# Patient Record
Sex: Male | Born: 1950 | Race: White | Hispanic: No | Marital: Married | Smoking: Never smoker
Health system: Southern US, Community
[De-identification: ages and names within clinical notes are randomized; demographics above are authoritative.]

## PROBLEM LIST (undated history)

## (undated) DIAGNOSIS — M48062 Spinal stenosis, lumbar region with neurogenic claudication: Secondary | ICD-10-CM

## (undated) DIAGNOSIS — M1990 Unspecified osteoarthritis, unspecified site: Secondary | ICD-10-CM

## (undated) DIAGNOSIS — M5416 Radiculopathy, lumbar region: Secondary | ICD-10-CM

## (undated) DIAGNOSIS — R030 Elevated blood-pressure reading, without diagnosis of hypertension: Secondary | ICD-10-CM

## (undated) DIAGNOSIS — R739 Hyperglycemia, unspecified: Secondary | ICD-10-CM

## (undated) DIAGNOSIS — G5602 Carpal tunnel syndrome, left upper limb: Secondary | ICD-10-CM

## (undated) DIAGNOSIS — G603 Idiopathic progressive neuropathy: Secondary | ICD-10-CM

## (undated) DIAGNOSIS — E538 Deficiency of other specified B group vitamins: Secondary | ICD-10-CM

## (undated) DIAGNOSIS — I8393 Asymptomatic varicose veins of bilateral lower extremities: Secondary | ICD-10-CM

## (undated) DIAGNOSIS — K759 Inflammatory liver disease, unspecified: Secondary | ICD-10-CM

## (undated) DIAGNOSIS — T8859XA Other complications of anesthesia, initial encounter: Secondary | ICD-10-CM

## (undated) DIAGNOSIS — E785 Hyperlipidemia, unspecified: Secondary | ICD-10-CM

## (undated) DIAGNOSIS — T7840XA Allergy, unspecified, initial encounter: Secondary | ICD-10-CM

## (undated) DIAGNOSIS — R55 Syncope and collapse: Secondary | ICD-10-CM

## (undated) DIAGNOSIS — N400 Enlarged prostate without lower urinary tract symptoms: Secondary | ICD-10-CM

## (undated) DIAGNOSIS — M4802 Spinal stenosis, cervical region: Secondary | ICD-10-CM

## (undated) DIAGNOSIS — Z87442 Personal history of urinary calculi: Secondary | ICD-10-CM

## (undated) DIAGNOSIS — G834 Cauda equina syndrome: Secondary | ICD-10-CM

## (undated) DIAGNOSIS — I10 Essential (primary) hypertension: Secondary | ICD-10-CM

## (undated) DIAGNOSIS — I872 Venous insufficiency (chronic) (peripheral): Secondary | ICD-10-CM

## (undated) DIAGNOSIS — R9431 Abnormal electrocardiogram [ECG] [EKG]: Secondary | ICD-10-CM

## (undated) DIAGNOSIS — N200 Calculus of kidney: Secondary | ICD-10-CM

## (undated) HISTORY — DX: Inflammatory liver disease, unspecified: K75.9

## (undated) HISTORY — DX: Unspecified osteoarthritis, unspecified site: M19.90

## (undated) HISTORY — PX: OTHER SURGICAL HISTORY: SHX169

## (undated) HISTORY — PX: TONSILLECTOMY: SUR1361

## (undated) HISTORY — PX: HEMORRHOID SURGERY: SHX153

## (undated) HISTORY — PX: LAMINOPLASTY: SHX997

## (undated) HISTORY — DX: Allergy, unspecified, initial encounter: T78.40XA

## (undated) HISTORY — DX: Essential (primary) hypertension: I10

## (undated) HISTORY — PX: COLONOSCOPY: SHX174

## (undated) HISTORY — PX: HERNIA REPAIR: SHX51

## (undated) HISTORY — DX: Hyperlipidemia, unspecified: E78.5

## (undated) HISTORY — DX: Calculus of kidney: N20.0

---

## 2013-05-01 HISTORY — PX: CARPAL TUNNEL RELEASE: SHX101

## 2015-03-02 DIAGNOSIS — Z800 Family history of malignant neoplasm of digestive organs: Secondary | ICD-10-CM | POA: Insufficient documentation

## 2015-03-02 DIAGNOSIS — E7800 Pure hypercholesterolemia, unspecified: Secondary | ICD-10-CM | POA: Insufficient documentation

## 2015-03-02 DIAGNOSIS — N200 Calculus of kidney: Secondary | ICD-10-CM | POA: Insufficient documentation

## 2015-03-02 LAB — HM HEPATITIS C SCREENING LAB: HM Hepatitis Screen: NEGATIVE

## 2015-04-14 LAB — HM COLONOSCOPY

## 2019-05-02 HISTORY — PX: CARPAL TUNNEL RELEASE: SHX101

## 2020-04-28 ENCOUNTER — Ambulatory Visit (INDEPENDENT_AMBULATORY_CARE_PROVIDER_SITE_OTHER): Payer: Medicare Other | Admitting: Internal Medicine

## 2020-04-28 ENCOUNTER — Other Ambulatory Visit: Payer: Self-pay

## 2020-04-28 ENCOUNTER — Encounter: Payer: Self-pay | Admitting: Internal Medicine

## 2020-04-28 ENCOUNTER — Encounter (INDEPENDENT_AMBULATORY_CARE_PROVIDER_SITE_OTHER): Payer: Self-pay

## 2020-04-28 VITALS — BP 116/76 | HR 70 | Temp 98.0°F | Resp 16 | Ht 69.5 in | Wt 170.0 lb

## 2020-04-28 DIAGNOSIS — R0609 Other forms of dyspnea: Secondary | ICD-10-CM | POA: Insufficient documentation

## 2020-04-28 DIAGNOSIS — G5602 Carpal tunnel syndrome, left upper limb: Secondary | ICD-10-CM

## 2020-04-28 DIAGNOSIS — R0600 Dyspnea, unspecified: Secondary | ICD-10-CM | POA: Diagnosis not present

## 2020-04-28 DIAGNOSIS — Z0289 Encounter for other administrative examinations: Secondary | ICD-10-CM

## 2020-04-28 NOTE — Progress Notes (Signed)
Subjective:  Patient ID: Andrew Stuart, male    DOB: 02-08-1951  Age: 69 y.o. MRN: [id number]  CC: Follow-up  NEW TO ME  This visit occurred during the SARS-CoV-2 public health emergency.  Safety protocols were in place, including screening questions prior to the visit, additional usage of staff PPE, and extensive cleaning of exam room while observing appropriate contact time as indicated for disinfecting solutions.    HPI [name] presents for establishing a primary care relationship.  He recently moved here from [place].  He said he had a complete physical in March 2021 and was told that everything was okay.  He has carpal tunnel syndrome in the left upper extremity and would like a referral.  He tells me that since moving to [place] he has started walking up inclines and he notices that when he walks at a 30 degree angle or greater he experiences some DOE but he denies chest pain, diaphoresis, dizziness, or lightheadedness.  He tells me he can walk for 3 miles on a flat surface and does not experience DOE.  No previous records or labs are available to me today.  History Andrew Stuart has a past medical history of Allergy, Arthritis, Hepatitis, and Hyperlipidemia.   He has a past surgical history that includes Tonsillectomy.   His family history includes Arthritis in his mother; Colon cancer in his mother and sister; Diabetes in his mother; Early death in his brother, father, mother, and sister; Heart disease in his mother; Hyperlipidemia in his mother; Lung cancer in his brother.He reports that he has never smoked. He has never used smokeless tobacco. He reports current alcohol use of about 7.0 standard drinks of alcohol per week. He reports that he does not use drugs.  Outpatient Medications Prior to Visit  Medication Sig Dispense Refill  . fluticasone (FLONASE) 50 MCG/ACT nasal spray Place into both nostrils daily.    . Naproxen Sodium (ALEVE PO) Take by mouth.     No  facility-administered medications prior to visit.    ROS Review of Systems  Constitutional: Negative for diaphoresis and fatigue.  HENT: Negative.   Eyes: Negative.   Respiratory: Positive for shortness of breath (DOE). Negative for cough, chest tightness and wheezing.   Cardiovascular: Negative for chest pain, palpitations and leg swelling.  Gastrointestinal: Negative.  Negative for abdominal pain, constipation, diarrhea, nausea and vomiting.  Endocrine: Negative.   Genitourinary: Negative.  Negative for difficulty urinating, dysuria, hematuria and urgency.  Musculoskeletal: Negative.  Negative for arthralgias and myalgias.  Skin: Negative.   Allergic/Immunologic: Negative.   Neurological: Negative.  Negative for dizziness, weakness and light-headedness.  Hematological: Negative.  Negative for adenopathy. Does not bruise/bleed easily.  Psychiatric/Behavioral: Negative.     Objective:  BP 116/76   Pulse 70   Temp 98 F (36.7 C) (Oral)   Resp 16   Ht 5' 9.5" (1.765 m)   Wt 170 lb (77.1 kg)   SpO2 97%   BMI 24.74 kg/m   Physical Exam Vitals reviewed.  Constitutional:      Appearance: Normal appearance.  HENT:     Nose: Nose normal.     Mouth/Throat:     Mouth: Mucous membranes are moist.  Eyes:     General: No scleral icterus.    Conjunctiva/sclera: Conjunctivae normal.  Cardiovascular:     Rate and Rhythm: Normal rate and regular rhythm.     Heart sounds: No murmur heard.     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     Breath sounds: No stridor. No wheezing, rhonchi or rales.  Abdominal:     General: Abdomen is flat. Bowel sounds are normal. There is no distension or abdominal bruit.     Palpations: Abdomen is soft. There is no hepatomegaly, splenomegaly or mass.     Tenderness: There is no abdominal tenderness.  Musculoskeletal:        General: Normal range of motion.      Cervical back: Neck supple.     Right lower leg: No edema.     Left lower leg: No edema.  Lymphadenopathy:     Cervical: No cervical adenopathy.  Skin:    General: Skin is warm and dry.  Neurological:     General: No focal deficit present.     Mental Status: He is alert.  Psychiatric:        Mood and Affect: Mood normal.      Assessment & Plan:   Andrew Stuart was seen today for follow-up.  Diagnoses and all orders for this visit:  Carpal tunnel syndrome on left -     Ambulatory referral to Orthopedic Surgery  DOE (dyspnea on exertion)- I have asked him to forward his medical records from his previous physicians to me.  He has DOE only when climbing at 30 degree incline.  His EKG is reassuring.  I do not have enough information to risk stratify him but we discussed doing an ETT and he says his symptoms are not that significant and he would rather monitor this over time than do some additional testing.  He will let me know if he develops any new or worsening symptoms. -     EKG 12-Lead   I am having Deberah Pelton maintain his Naproxen Sodium (ALEVE PO) and fluticasone.  No orders of the defined types were placed in this encounter.    Follow-up: No follow-ups on file.  Sanda Linger, MD

## 2020-04-30 ENCOUNTER — Encounter: Payer: Self-pay | Admitting: Internal Medicine

## 2020-05-21 ENCOUNTER — Other Ambulatory Visit: Payer: Self-pay

## 2020-05-21 ENCOUNTER — Ambulatory Visit (INDEPENDENT_AMBULATORY_CARE_PROVIDER_SITE_OTHER): Payer: Medicare Other | Admitting: Orthopaedic Surgery

## 2020-05-21 ENCOUNTER — Encounter: Payer: Self-pay | Admitting: Orthopaedic Surgery

## 2020-05-21 DIAGNOSIS — G5602 Carpal tunnel syndrome, left upper limb: Secondary | ICD-10-CM | POA: Diagnosis not present

## 2020-05-21 NOTE — Progress Notes (Signed)
Office Visit Note   Patient: Andrew Stuart           Date of Birth: 10/14/1950           MRN: [id number] Visit Date: 05/21/2020              Requested by: [name], MD [address] [place],  [place] [address] PCP: [name], MD   Assessment & Plan: Visit Diagnoses:  1. Left carpal tunnel syndrome     Plan: Impression is left carpal tunnel syndrome.  Based on discussion of treatment options to include cortisone injection, bracing, surgery patient would like to obtain nerve conduction studies first as well as try bracing.  He would like to see what the results of the nerve conduction studies far for so that he can better decide what treatment option he will favor.  Follow-Up Instructions: Return if symptoms worsen or fail to improve.   Orders:  No orders of the defined types were placed in this encounter.  No orders of the defined types were placed in this encounter.     Procedures: No procedures performed   Clinical Data: No additional findings.   Subjective: Chief Complaint  Patient presents with  . Left Hand - Pain    [name] is a very pleasant 70 year old gentleman retired and recently relocated to [place].  He states that he has had numbness and tingling and nighttime pain for years.  He is status post right carpal tunnel release in 2015 in [place].  He has tingling as well.  He has early trigger fingers.  They do not cause any pain.  He has never worn any braces or had nerve conduction studies for the left hand before.   Review of Systems  Constitutional: Negative.   All other systems reviewed and are negative.    Objective: Vital Signs: There were no vitals taken for this visit.  Physical Exam Vitals and nursing note reviewed.  Constitutional:      Appearance: He is well-developed and well-nourished.  HENT:     Head: Normocephalic and atraumatic.  Eyes:     Pupils: Pupils are equal, round, and reactive to light.  Pulmonary:      Effort: Pulmonary effort is normal.  Abdominal:     Palpations: Abdomen is soft.  Musculoskeletal:        General: Normal range of motion.     Cervical back: Neck supple.  Skin:    General: Skin is warm.  Neurological:     Mental Status: He is alert and oriented to person, place, and time.  Psychiatric:        Mood and Affect: Mood and affect normal.        Behavior: Behavior normal.        Thought Content: Thought content normal.        Judgment: Judgment normal.     Ortho Exam Left hand shows good muscle function and strength of the thenar compartment.  Slight thenar flattening.  Negative carpal tunnel compressive signs.  Specialty Comments:  No specialty comments available.  Imaging: No results found.   PMFS History: Patient Active Problem List   Diagnosis Date Noted  . Carpal tunnel syndrome on left 04/28/2020  . DOE (dyspnea on exertion) 04/28/2020   Past Medical History:  Diagnosis Date  . Allergy   . Arthritis   . Hepatitis   . Hyperlipidemia     Family History  Problem Relation Age of Onset  . Arthritis  Mother   . Diabetes Mother   . Early death Mother   . Heart disease Mother   . Hyperlipidemia Mother   . Colon cancer Mother   . Early death Father   . Early death Sister   . Colon cancer Sister   . Early death Brother   . Lung cancer Brother     Past Surgical History:  Procedure Laterality Date  . TONSILLECTOMY     Social History   Occupational History  . Not on file  Tobacco Use  . Smoking status: Never Smoker  . Smokeless tobacco: Never Used  Vaping Use  . Vaping Use: Never used  Substance and Sexual Activity  . Alcohol use: Yes    Alcohol/week: 7.0 standard drinks    Types: 7 Glasses of wine per week  . Drug use: Never  . Sexual activity: Yes    Partners: Male    Birth control/protection: Condom

## 2020-05-21 NOTE — Addendum Note (Signed)
Addended by: Albertina Parr on: 05/21/2020 02:07 PM   Modules accepted: Orders

## 2020-05-25 ENCOUNTER — Telehealth: Payer: Self-pay | Admitting: Physical Medicine and Rehabilitation

## 2020-05-25 NOTE — Telephone Encounter (Signed)
Pt called stating he has a referral and would like to go ahead and schedule his appt  [phone]

## 2020-05-26 ENCOUNTER — Telehealth: Payer: Self-pay | Admitting: Orthopaedic Surgery

## 2020-05-26 NOTE — Telephone Encounter (Signed)
Called pt and sch 2/25

## 2020-05-26 NOTE — Telephone Encounter (Signed)
Pt called and said that he thinks someone called him trying to schedule him an appt with Dr.Newton because he was referred by Dr.Xu. Please give him a call at [phone]

## 2020-05-26 NOTE — Telephone Encounter (Signed)
Called pt and sch 2/25 

## 2020-05-26 NOTE — Telephone Encounter (Signed)
Called pt back and LVM #2

## 2020-06-01 ENCOUNTER — Telehealth: Payer: Self-pay | Admitting: Internal Medicine

## 2020-06-01 NOTE — Telephone Encounter (Signed)
Patient was seen 12.29.21 and it was marked as no show even though he had his appointment and he is getting a bill for a no show fee and would like to get that fixed

## 2020-06-02 NOTE — Telephone Encounter (Signed)
No show fee removed from the visit for DOS: 12.29.21, followed up with the patient in my chart.

## 2020-06-25 ENCOUNTER — Ambulatory Visit (INDEPENDENT_AMBULATORY_CARE_PROVIDER_SITE_OTHER): Payer: Medicare Other | Admitting: Physical Medicine and Rehabilitation

## 2020-06-25 ENCOUNTER — Encounter: Payer: Self-pay | Admitting: Physical Medicine and Rehabilitation

## 2020-06-25 ENCOUNTER — Other Ambulatory Visit: Payer: Self-pay

## 2020-06-25 DIAGNOSIS — R202 Paresthesia of skin: Secondary | ICD-10-CM

## 2020-06-25 NOTE — Progress Notes (Signed)
Numbness in left hand. Minor pain. Numbness worse at night. Symptoms worsen with more use- has forearm pain sometimes. Right hand dominant No lotion per patient Numeric Pain Rating Scale and Functional Assessment Average Pain 3   In the last MONTH (on 0-10 scale) has pain interfered with the following?  1. General activity like being  able to carry out your everyday physical activities such as walking, climbing stairs, carrying groceries, or moving a chair?  Rating(4)

## 2020-06-28 NOTE — Progress Notes (Signed)
Andrew Stuart - 70 y.o. male MRN [id number]  Date of birth: 1950-06-24  Office Visit Note: Visit Date: 06/25/2020 PCP: [name], MD Referred by: [name], MD  Subjective: Chief Complaint  Patient presents with  . Left Hand - Pain   HPI:  Andrew Stuart is a 70 y.o. male who comes in today at the request of Dr. Glee Arvin for electrodiagnostic study of the Left upper extremities.  Patient is Right hand dominant.  He describes several months of worsening left hand numbness particularly in the radial digits.  Some pain.  3 out of 10 pain.  Symptoms are worse at night and with certain movements and positions.  He does have some symptoms that will radiate up to the forearm.  He feels like these are fairly similar to the prior right-sided symptoms that he had a prior right carpal tunnel release in 2015.  The surgery was performed in [place].  He does not recall having electrodiagnostic study.  He denies any frank radicular symptoms.  ROS Otherwise per HPI.  Assessment & Plan: Visit Diagnoses:    ICD-10-CM   1. Paresthesia of skin  R20.2 NCV with EMG (electromyography)    Plan: Impression: The above electrodiagnostic study is ABNORMAL and reveals evidence of a moderate to severe left median nerve entrapment at the wrist (carpal tunnel syndrome) affecting sensory and motor components.   There is no significant electrodiagnostic evidence of any other focal nerve entrapment, brachial plexopathy or cervical radiculopathy.   Recommendations: 1.  Follow-up with referring physician. Consider surgical evaluation. 2.  Continue current management of symptoms.  Meds & Orders: No orders of the defined types were placed in this encounter.   Orders Placed This Encounter  Procedures  . NCV with EMG (electromyography)    Follow-up: Return in about 2 weeks (around 07/09/2020) for  Glee Arvin, MD.   Procedures: No procedures performed  EMG & NCV Findings: Evaluation of the left median motor  nerve showed prolonged distal onset latency (6.6 ms) and decreased conduction velocity (Elbow-Wrist, 46 m/s).  The left median (across palm) sensory nerve showed no response (Wrist) and prolonged distal peak latency (Palm, 2.2 ms).  All remaining nerves (as indicated in the following tables) were within normal limits.    All examined muscles (as indicated in the following table) showed no evidence of electrical instability.    Impression: The above electrodiagnostic study is ABNORMAL and reveals evidence of a moderate to severe left median nerve entrapment at the wrist (carpal tunnel syndrome) affecting sensory and motor components.   There is no significant electrodiagnostic evidence of any other focal nerve entrapment, brachial plexopathy or cervical radiculopathy.   Recommendations: 1.  Follow-up with referring physician. Consider surgical evaluation. 2.  Continue current management of symptoms.  ___________________________ Andrew Stuart FAAPMR Board Certified, American Board of Physical Medicine and Rehabilitation    Nerve Conduction Studies Anti Sensory Summary Table   Stim Site NR Peak (ms) Norm Peak (ms) P-T Amp (V) Norm P-T Amp Site1 Site2 Delta-P (ms) Dist (cm) Vel (m/s) Norm Vel (m/s)  Left Median Acr Palm Anti Sensory (2nd Digit)  31.1C  Wrist *NR  <3.6  >10 Wrist Palm  0.0    Palm    *2.2 <2.0 0.5         Left Radial Anti Sensory (Base 1st Digit)  31C  Wrist    2.1 <3.1 19.6  Wrist Base 1st Digit 2.1 0.0    Left Ulnar Anti Sensory (5th  Digit)  31.2C  Wrist    3.2 <3.7 21.7 >15.0 Wrist 5th Digit 3.2 14.0 44 >38   Motor Summary Table   Stim Site NR Onset (ms) Norm Onset (ms) O-P Amp (mV) Norm O-P Amp Site1 Site2 Delta-0 (ms) Dist (cm) Vel (m/s) Norm Vel (m/s)  Left Median Motor (Abd Poll Brev)  31.1C  Wrist    *6.6 <4.2 5.6 >5 Elbow Wrist 4.6 21.0 *46 >50  Elbow    11.2  5.7         Left Ulnar Motor (Abd Dig Min)  31.3C  Wrist    3.0 <4.2 9.0 >3 B Elbow Wrist 3.2  20.0 63 >53  B Elbow    6.2  8.9  A Elbow B Elbow 1.2 10.0 83 >53  A Elbow    7.4  8.6          EMG   Side Muscle Nerve Root Ins Act Fibs Psw Amp Dur Poly Recrt Int Dennie Bible Comment  Left Abd Poll Brev Median C8-T1 Nml Nml Nml Nml Nml 0 Nml Nml   Left 1stDorInt Ulnar C8-T1 Nml Nml Nml Nml Nml 0 Nml Nml   Left PronatorTeres Median C6-7 Nml Nml Nml Nml Nml 0 Nml Nml     Nerve Conduction Studies Anti Sensory Left/Right Comparison   Stim Site L Lat (ms) R Lat (ms) L-R Lat (ms) L Amp (V) R Amp (V) L-R Amp (%) Site1 Site2 L Vel (m/s) R Vel (m/s) L-R Vel (m/s)  Median Acr Palm Anti Sensory (2nd Digit)  31.1C  Wrist       Wrist Palm     Palm *2.2   0.5         Radial Anti Sensory (Base 1st Digit)  31C  Wrist 2.1   19.6   Wrist Base 1st Digit     Ulnar Anti Sensory (5th Digit)  31.2C  Wrist 3.2   21.7   Wrist 5th Digit 44     Motor Left/Right Comparison   Stim Site L Lat (ms) R Lat (ms) L-R Lat (ms) L Amp (mV) R Amp (mV) L-R Amp (%) Site1 Site2 L Vel (m/s) R Vel (m/s) L-R Vel (m/s)  Median Motor (Abd Poll Brev)  31.1C  Wrist *6.6   5.6   Elbow Wrist *46    Elbow 11.2   5.7         Ulnar Motor (Abd Dig Min)  31.3C  Wrist 3.0   9.0   B Elbow Wrist 63    B Elbow 6.2   8.9   A Elbow B Elbow 83    A Elbow 7.4   8.6            Waveforms:             Clinical History: No specialty comments available.     Objective:  VS:  HT:    WT:   BMI:     BP:   HR: bpm  TEMP: ( )  RESP:  Physical Exam Musculoskeletal:        General: No tenderness.     Comments: Inspection reveals no atrophy of the bilateral APB or FDI or hand intrinsics. There is no swelling, color changes, allodynia or dystrophic changes. There is 5 out of 5 strength in the bilateral wrist extension, finger abduction and long finger flexion. There is intact sensation to light touch in all dermatomal and peripheral nerve distributions.  There is a negative Phalen's test on the  left. There is a negative Hoffmann's  test bilaterally.  Skin:    General: Skin is warm and dry.     Findings: No erythema or rash.  Neurological:     General: No focal deficit present.     Mental Status: He is alert and oriented to person, place, and time.     Sensory: No sensory deficit.     Motor: No weakness or abnormal muscle tone.     Coordination: Coordination normal.     Gait: Gait normal.  Psychiatric:        Mood and Affect: Mood normal.        Behavior: Behavior normal.        Thought Content: Thought content normal.      Imaging: No results found.

## 2020-06-28 NOTE — Procedures (Signed)
EMG & NCV Findings: Evaluation of the left median motor nerve showed prolonged distal onset latency (6.6 ms) and decreased conduction velocity (Elbow-Wrist, 46 m/s).  The left median (across palm) sensory nerve showed no response (Wrist) and prolonged distal peak latency (Palm, 2.2 ms).  All remaining nerves (as indicated in the following tables) were within normal limits.    All examined muscles (as indicated in the following table) showed no evidence of electrical instability.    Impression: The above electrodiagnostic study is ABNORMAL and reveals evidence of a moderate to severe left median nerve entrapment at the wrist (carpal tunnel syndrome) affecting sensory and motor components.   There is no significant electrodiagnostic evidence of any other focal nerve entrapment, brachial plexopathy or cervical radiculopathy.   Recommendations: 1.  Follow-up with referring physician. Consider surgical evaluation. 2.  Continue current management of symptoms.  ___________________________ Naaman Plummer FAAPMR Board Certified, American Board of Physical Medicine and Rehabilitation    Nerve Conduction Studies Anti Sensory Summary Table   Stim Site NR Peak (ms) Norm Peak (ms) P-T Amp (V) Norm P-T Amp Site1 Site2 Delta-P (ms) Dist (cm) Vel (m/s) Norm Vel (m/s)  Left Median Acr Palm Anti Sensory (2nd Digit)  31.1C  Wrist *NR  <3.6  >10 Wrist Palm  0.0    Palm    *2.2 <2.0 0.5         Left Radial Anti Sensory (Base 1st Digit)  31C  Wrist    2.1 <3.1 19.6  Wrist Base 1st Digit 2.1 0.0    Left Ulnar Anti Sensory (5th Digit)  31.2C  Wrist    3.2 <3.7 21.7 >15.0 Wrist 5th Digit 3.2 14.0 44 >38   Motor Summary Table   Stim Site NR Onset (ms) Norm Onset (ms) O-P Amp (mV) Norm O-P Amp Site1 Site2 Delta-0 (ms) Dist (cm) Vel (m/s) Norm Vel (m/s)  Left Median Motor (Abd Poll Brev)  31.1C  Wrist    *6.6 <4.2 5.6 >5 Elbow Wrist 4.6 21.0 *46 >50  Elbow    11.2  5.7         Left Ulnar Motor (Abd Dig  Min)  31.3C  Wrist    3.0 <4.2 9.0 >3 B Elbow Wrist 3.2 20.0 63 >53  B Elbow    6.2  8.9  A Elbow B Elbow 1.2 10.0 83 >53  A Elbow    7.4  8.6          EMG   Side Muscle Nerve Root Ins Act Fibs Psw Amp Dur Poly Recrt Int Dennie Bible Comment  Left Abd Poll Brev Median C8-T1 Nml Nml Nml Nml Nml 0 Nml Nml   Left 1stDorInt Ulnar C8-T1 Nml Nml Nml Nml Nml 0 Nml Nml   Left PronatorTeres Median C6-7 Nml Nml Nml Nml Nml 0 Nml Nml     Nerve Conduction Studies Anti Sensory Left/Right Comparison   Stim Site L Lat (ms) R Lat (ms) L-R Lat (ms) L Amp (V) R Amp (V) L-R Amp (%) Site1 Site2 L Vel (m/s) R Vel (m/s) L-R Vel (m/s)  Median Acr Palm Anti Sensory (2nd Digit)  31.1C  Wrist       Wrist Palm     Palm *2.2   0.5         Radial Anti Sensory (Base 1st Digit)  31C  Wrist 2.1   19.6   Wrist Base 1st Digit     Ulnar Anti Sensory (5th Digit)  31.2C  Wrist 3.2  21.7   Wrist 5th Digit 44     Motor Left/Right Comparison   Stim Site L Lat (ms) R Lat (ms) L-R Lat (ms) L Amp (mV) R Amp (mV) L-R Amp (%) Site1 Site2 L Vel (m/s) R Vel (m/s) L-R Vel (m/s)  Median Motor (Abd Poll Brev)  31.1C  Wrist *6.6   5.6   Elbow Wrist *46    Elbow 11.2   5.7         Ulnar Motor (Abd Dig Min)  31.3C  Wrist 3.0   9.0   B Elbow Wrist 63    B Elbow 6.2   8.9   A Elbow B Elbow 83    A Elbow 7.4   8.6            Waveforms:

## 2020-06-29 ENCOUNTER — Encounter: Payer: Self-pay | Admitting: Internal Medicine

## 2020-07-06 ENCOUNTER — Ambulatory Visit (INDEPENDENT_AMBULATORY_CARE_PROVIDER_SITE_OTHER): Payer: Medicare Other | Admitting: Orthopaedic Surgery

## 2020-07-06 ENCOUNTER — Other Ambulatory Visit: Payer: Self-pay

## 2020-07-06 ENCOUNTER — Encounter: Payer: Self-pay | Admitting: Orthopaedic Surgery

## 2020-07-06 DIAGNOSIS — G5602 Carpal tunnel syndrome, left upper limb: Secondary | ICD-10-CM | POA: Diagnosis not present

## 2020-07-06 NOTE — Progress Notes (Signed)
Office Visit Note   Patient: Andrew Stuart           Date of Birth: Oct 16, 1950           MRN: [id number] Visit Date: 07/06/2020              Requested by: [name], MD [address] [place],  [place] [address] PCP: [name], MD   Assessment & Plan: Visit Diagnoses:  1. Carpal tunnel syndrome on left     Plan: Impression is left carpal tunnel syndrome moderate to severe nature.  We did discuss various treatment options to include injections versus surgical intervention.  I have recommended surgical intervention based on the severity of compression.  He agrees and would like to proceed.  Risk, benefits and possible occasions reviewed.  Rehab recovery time discussed.  All questions answered.  He will discuss this with his husband and get back to [place].  Follow-Up Instructions: No follow-ups on file.   Orders:  No orders of the defined types were placed in this encounter.  No orders of the defined types were placed in this encounter.     Procedures: No procedures performed   Clinical Data: No additional findings.   Subjective: No chief complaint on file.   HPI patient is a very pleasant 70 year old gentleman who comes in today to discuss nerve conduction studies left upper extremity.  He has been dealing with carpal tunnel symptoms for over 5 years which have progressively worsened.  Worse at night.  He has tried wrist splints without relief.  No previous cortisone injection to the left carpal tunnel.  He has had carpal tunnel release on the right and has done well there.  Recent nerve conduction study left upper extremity shows moderate to severe compression median nerve.  Review of Systems as detailed in HPI.  All others reviewed and are negative.   Objective: Vital Signs: There were no vitals taken for this visit.  Physical Exam well-developed well-nourished gentleman no acute distress.  Alert and oriented x3.  Ortho Exam stable left wrist exam.  Specialty  Comments:  No specialty comments available.  Imaging: No new imaging   PMFS History: Patient Active Problem List   Diagnosis Date Noted  . Carpal tunnel syndrome on left 04/28/2020  . DOE (dyspnea on exertion) 04/28/2020   Past Medical History:  Diagnosis Date  . Allergy   . Arthritis   . Hepatitis   . Hyperlipidemia     Family History  Problem Relation Age of Onset  . Arthritis Mother   . Diabetes Mother   . Early death Mother   . Heart disease Mother   . Hyperlipidemia Mother   . Colon cancer Mother   . Early death Father   . Early death Sister   . Colon cancer Sister   . Early death Brother   . Lung cancer Brother     Past Surgical History:  Procedure Laterality Date  . CARPAL TUNNEL RELEASE Right 2015  . TONSILLECTOMY     Social History   Occupational History  . Not on file  Tobacco Use  . Smoking status: Never Smoker  . Smokeless tobacco: Never Used  Vaping Use  . Vaping Use: Never used  Substance and Sexual Activity  . Alcohol use: Yes    Alcohol/week: 7.0 standard drinks    Types: 7 Glasses of wine per week  . Drug use: Never  . Sexual activity: Yes    Partners: Male  Birth control/protection: Condom

## 2020-07-21 ENCOUNTER — Telehealth: Payer: Self-pay | Admitting: Orthopaedic Surgery

## 2020-07-21 ENCOUNTER — Other Ambulatory Visit: Payer: Self-pay | Admitting: Physician Assistant

## 2020-07-21 MED ORDER — HYDROCODONE-ACETAMINOPHEN 5-325 MG PO TABS: 1.0000 | ORAL_TABLET | Freq: Four times a day (QID) | ORAL | 0 refills | Status: DC | PRN

## 2020-07-21 MED ORDER — ONDANSETRON HCL 4 MG PO TABS: 4.0000 mg | ORAL_TABLET | Freq: Three times a day (TID) | ORAL | 0 refills | Status: DC | PRN

## 2020-07-21 NOTE — Telephone Encounter (Signed)
Pt has broken out in poison ivy and would like to know if he has to cancel surgery. He said it is on his left wrist and right hand with blisters. Please give him a call back [phone]

## 2020-07-21 NOTE — Telephone Encounter (Signed)
Yes we should postpone the surgery until it resolves.  Please have him call Debbie when he's ready to reschedule surgery.

## 2020-07-23 NOTE — Telephone Encounter (Signed)
Called patient no answer. LMOM with details below. ? ?

## 2020-07-29 ENCOUNTER — Inpatient Hospital Stay: Payer: Medicare Other | Admitting: Orthopaedic Surgery

## 2020-08-26 ENCOUNTER — Other Ambulatory Visit: Payer: Self-pay

## 2020-08-26 ENCOUNTER — Ambulatory Visit (INDEPENDENT_AMBULATORY_CARE_PROVIDER_SITE_OTHER): Payer: Medicare Other | Admitting: Internal Medicine

## 2020-08-26 ENCOUNTER — Encounter: Payer: Self-pay | Admitting: Internal Medicine

## 2020-08-26 VITALS — BP 134/76 | HR 76 | Temp 97.9°F | Resp 16 | Ht 69.0 in | Wt 169.4 lb

## 2020-08-26 DIAGNOSIS — N400 Enlarged prostate without lower urinary tract symptoms: Secondary | ICD-10-CM | POA: Diagnosis not present

## 2020-08-26 DIAGNOSIS — Z23 Encounter for immunization: Secondary | ICD-10-CM | POA: Diagnosis not present

## 2020-08-26 DIAGNOSIS — E785 Hyperlipidemia, unspecified: Secondary | ICD-10-CM | POA: Diagnosis not present

## 2020-08-26 LAB — CBC WITH DIFFERENTIAL/PLATELET
Basophils Absolute: 0 10*3/uL (ref 0.0–0.1)
Basophils Relative: 0.5 % (ref 0.0–3.0)
Eosinophils Absolute: 0.1 10*3/uL (ref 0.0–0.7)
Eosinophils Relative: 2.4 % (ref 0.0–5.0)
HCT: 44.6 % (ref 39.0–52.0)
Hemoglobin: 15.2 g/dL (ref 13.0–17.0)
Lymphocytes Relative: 35.2 % (ref 12.0–46.0)
Lymphs Abs: 2 10*3/uL (ref 0.7–4.0)
MCHC: 34.1 g/dL (ref 30.0–36.0)
MCV: 95.5 fl (ref 78.0–100.0)
Monocytes Absolute: 0.5 10*3/uL (ref 0.1–1.0)
Monocytes Relative: 8.7 % (ref 3.0–12.0)
Neutro Abs: 3 10*3/uL (ref 1.4–7.7)
Neutrophils Relative %: 53.2 % (ref 43.0–77.0)
Platelets: 252 10*3/uL (ref 150.0–400.0)
RBC: 4.67 Mil/uL (ref 4.22–5.81)
RDW: 13.6 % (ref 11.5–15.5)
WBC: 5.6 10*3/uL (ref 4.0–10.5)

## 2020-08-26 LAB — BASIC METABOLIC PANEL WITH GFR
BUN: 18 mg/dL (ref 6–23)
CO2: 31 meq/L (ref 19–32)
Calcium: 10 mg/dL (ref 8.4–10.5)
Chloride: 103 meq/L (ref 96–112)
Creatinine, Ser: 0.98 mg/dL (ref 0.40–1.50)
GFR: 78.79 mL/min
Glucose, Bld: 93 mg/dL (ref 70–99)
Potassium: 4.3 meq/L (ref 3.5–5.1)
Sodium: 140 meq/L (ref 135–145)

## 2020-08-26 LAB — URINALYSIS, ROUTINE W REFLEX MICROSCOPIC
Bilirubin Urine: NEGATIVE
Hgb urine dipstick: NEGATIVE
Ketones, ur: NEGATIVE
Leukocytes,Ua: NEGATIVE
Nitrite: NEGATIVE
RBC / HPF: NONE SEEN (ref 0–?)
Specific Gravity, Urine: <=1.005 — AB (ref 1.000–1.030)
Total Protein, Urine: NEGATIVE
Urine Glucose: NEGATIVE
Urobilinogen, UA: 0.2 (ref 0.0–1.0)
WBC, UA: NONE SEEN (ref 0–?)
pH: 6 (ref 5.0–8.0)

## 2020-08-26 LAB — LIPID PANEL
Cholesterol: 219 mg/dL — ABNORMAL HIGH (ref 0–200)
HDL: 55.7 mg/dL
LDL Cholesterol: 146 mg/dL — ABNORMAL HIGH (ref 0–99)
NonHDL: 163.71
Total CHOL/HDL Ratio: 4
Triglycerides: 87 mg/dL (ref 0.0–149.0)
VLDL: 17.4 mg/dL (ref 0.0–40.0)

## 2020-08-26 LAB — PSA: PSA: 2.35 ng/mL (ref 0.10–4.00)

## 2020-08-26 LAB — HEPATIC FUNCTION PANEL
ALT: 30 U/L (ref 0–53)
AST: 24 U/L (ref 0–37)
Albumin: 4.5 g/dL (ref 3.5–5.2)
Alkaline Phosphatase: 86 U/L (ref 39–117)
Bilirubin, Direct: 0.1 mg/dL (ref 0.0–0.3)
Total Bilirubin: 0.8 mg/dL (ref 0.2–1.2)
Total Protein: 6.9 g/dL (ref 6.0–8.3)

## 2020-08-26 LAB — TSH: TSH: 3.22 u[IU]/mL (ref 0.35–4.50)

## 2020-08-26 MED ORDER — ATORVASTATIN CALCIUM 20 MG PO TABS: 20.0000 mg | ORAL_TABLET | Freq: Every day | ORAL | 1 refills | Status: DC

## 2020-08-26 MED ORDER — SHINGRIX 50 MCG/0.5ML IM SUSR: 0.5000 mL | Freq: Once | INTRAMUSCULAR | 1 refills | Status: AC

## 2020-08-26 NOTE — Patient Instructions (Addendum)
Health Maintenance, Male Adopting a healthy lifestyle and getting preventive care are important in promoting health and wellness. Ask your health care provider about:  The right schedule for you to have regular tests and exams.  Things you can do on your own to prevent diseases and keep yourself healthy. What should I know about diet, weight, and exercise? Eat a healthy diet  Eat a diet that includes plenty of vegetables, fruits, low-fat dairy products, and lean protein.  Do not eat a lot of foods that are high in solid fats, added sugars, or sodium.   Maintain a healthy weight Body mass index (BMI) is a measurement that can be used to identify possible weight problems. It estimates body fat based on height and weight. Your health care provider can help determine your BMI and help you achieve or maintain a healthy weight. Get regular exercise Get regular exercise. This is one of the most important things you can do for your health. Most adults should:  Exercise for at least 150 minutes each week. The exercise should increase your heart rate and make you sweat (moderate-intensity exercise).  Do strengthening exercises at least twice a week. This is in addition to the moderate-intensity exercise.  Spend less time sitting. Even light physical activity can be beneficial. Watch cholesterol and blood lipids Have your blood tested for lipids and cholesterol at 69 years of age, then have this test every 5 years. You may need to have your cholesterol levels checked more often if:  Your lipid or cholesterol levels are high.  You are older than 69 years of age.  You are at high risk for heart disease. What should I know about cancer screening? Many types of cancers can be detected early and may often be prevented. Depending on your health history and family history, you may need to have cancer screening at various ages. This may include screening for:  Colorectal cancer.  Prostate  cancer.  Skin cancer.  Lung cancer. What should I know about heart disease, diabetes, and high blood pressure? Blood pressure and heart disease  High blood pressure causes heart disease and increases the risk of stroke. This is more likely to develop in people who have high blood pressure readings, are of African descent, or are overweight.  Talk with your health care provider about your target blood pressure readings.  Have your blood pressure checked: ? Every 3-5 years if you are 18-39 years of age. ? Every year if you are 40 years old or older.  If you are between the ages of 65 and 75 and are a current or former smoker, ask your health care provider if you should have a one-time screening for abdominal aortic aneurysm (AAA). Diabetes Have regular diabetes screenings. This checks your fasting blood sugar level. Have the screening done:  Once every three years after age 45 if you are at a normal weight and have a low risk for diabetes.  More often and at a younger age if you are overweight or have a high risk for diabetes. What should I know about preventing infection? Hepatitis B If you have a higher risk for hepatitis B, you should be screened for this virus. Talk with your health care provider to find out if you are at risk for hepatitis B infection. Hepatitis C Blood testing is recommended for:  Everyone born from 1945 through 1965.  Anyone with known risk factors for hepatitis C. Sexually transmitted infections (STIs)  You should be screened each   year for STIs, including gonorrhea and chlamydia, if: ? You are sexually active and are younger than 69 years of age. ? You are older than 69 years of age and your health care provider tells you that you are at risk for this type of infection. ? Your sexual activity has changed since you were last screened, and you are at increased risk for chlamydia or gonorrhea. Ask your health care provider if you are at risk.  Ask your  health care provider about whether you are at high risk for HIV. Your health care provider may recommend a prescription medicine to help prevent HIV infection. If you choose to take medicine to prevent HIV, you should first get tested for HIV. You should then be tested every 3 months for as long as you are taking the medicine. Follow these instructions at home: Lifestyle  Do not use any products that contain nicotine or tobacco, such as cigarettes, e-cigarettes, and chewing tobacco. If you need help quitting, ask your health care provider.  Do not use street drugs.  Do not share needles.  Ask your health care provider for help if you need support or information about quitting drugs. Alcohol use  Do not drink alcohol if your health care provider tells you not to drink.  If you drink alcohol: ? Limit how much you have to 0-2 drinks a day. ? Be aware of how much alcohol is in your drink. In the [place] one drink equals one 12 oz bottle of beer (355 mL), one 5 oz glass of wine (148 mL), or one 1 oz glass of hard liquor (44 mL). General instructions  Schedule regular health, dental, and eye exams.  Stay current with your vaccines.  Tell your health care provider if: ? You often feel depressed. ? You have ever been abused or do not feel safe at home. Summary  Adopting a healthy lifestyle and getting preventive care are important in promoting health and wellness.  Follow your health care provider's instructions about healthy diet, exercising, and getting tested or screened for diseases.  Follow your health care provider's instructions on monitoring your cholesterol and blood pressure. This information is not intended to replace advice given to you by your health care provider. Make sure you discuss any questions you have with your health care provider. Document Revised: 04/10/2018 Document Reviewed: 04/10/2018 Elsevier Patient Education  2021 [employer] Inc.  High Cholesterol  High  cholesterol is a condition in which the blood has high levels of a white, waxy substance similar to fat (cholesterol). The liver makes all the cholesterol that the body needs. The human body needs small amounts of cholesterol to help build cells. A person gets extra or excess cholesterol from the food that he or she eats. The blood carries cholesterol from the liver to the rest of the body. If you have high cholesterol, deposits (plaques) may build up on the walls of your arteries. Arteries are the blood vessels that carry blood away from your heart. These plaques make the arteries narrow and stiff. Cholesterol plaques increase your risk for heart attack and stroke. Work with your health care provider to keep your cholesterol levels in a healthy range. What increases the risk? The following factors may make you more likely to develop this condition:  Eating foods that are high in animal fat (saturated fat) or cholesterol.  Being overweight.  Not getting enough exercise.  A family history of high cholesterol (familial hypercholesterolemia).  Use of tobacco products.  Having diabetes. What are the signs or symptoms? There are no symptoms of this condition. How is this diagnosed? This condition may be diagnosed based on the results of a blood test.  If you are older than 69 years of age, your health care provider may check your cholesterol levels every 4-6 years.  You may be checked more often if you have high cholesterol or other risk factors for heart disease. The blood test for cholesterol measures:  "Bad" cholesterol, or LDL cholesterol. This is the main type of cholesterol that causes heart disease. The desired level is less than 100 mg/dL.  "Good" cholesterol, or HDL cholesterol. HDL helps protect against heart disease by cleaning the arteries and carrying the LDL to the liver for processing. The desired level for HDL is 60 mg/dL or higher.  Triglycerides. These are fats that your  body can store or burn for energy. The desired level is less than 150 mg/dL.  Total cholesterol. This measures the total amount of cholesterol in your blood and includes LDL, HDL, and triglycerides. The desired level is less than 200 mg/dL. How is this treated? This condition may be treated with:  Diet changes. You may be asked to eat foods that have more fiber and less saturated fats or added sugar.  Lifestyle changes. These may include regular exercise, maintaining a healthy weight, and quitting use of tobacco products.  Medicines. These are given when diet and lifestyle changes have not worked. You may be prescribed a statin medicine to help lower your cholesterol levels. Follow these instructions at home: Eating and drinking  Eat a healthy, balanced diet. This diet includes: ? Daily servings of a variety of fresh, frozen, or canned fruits and vegetables. ? Daily servings of whole grain foods that are rich in fiber. ? Foods that are low in saturated fats and trans fats. These include poultry and fish without skin, lean cuts of meat, and low-fat dairy products. ? A variety of fish, especially oily fish that contain omega-3 fatty acids. Aim to eat fish at least 2 times a week.  Avoid foods and drinks that have added sugar.  Use healthy cooking methods, such as roasting, grilling, broiling, baking, poaching, steaming, and stir-frying. Do not fry your food except for stir-frying.   Lifestyle  Get regular exercise. Aim to exercise for a total of 150 minutes a week. Increase your activity level by doing activities such as gardening, walking, and taking the stairs.  Do not use any products that contain nicotine or tobacco, such as cigarettes, e-cigarettes, and chewing tobacco. If you need help quitting, ask your health care provider.   General instructions  Take over-the-counter and prescription medicines only as told by your health care provider.  Keep all follow-up visits as told by  your health care provider. This is important. Where to find more information  American Heart Association: www.heart.org  National Heart, Lung, and Blood Institute: [employer].is Contact a health care provider if:  You have trouble achieving or maintaining a healthy diet or weight.  You are starting an exercise program.  You are unable to stop smoking. Get help right away if:  You have chest pain.  You have trouble breathing.  You have any symptoms of a stroke. "BE FAST" is an easy way to remember the main warning signs of a stroke: ? B - Balance. Signs are dizziness, sudden trouble walking, or loss of balance. ? E - Eyes. Signs are trouble seeing or a sudden change in vision. ?  F - Face. Signs are sudden weakness or numbness of the face, or the face or eyelid drooping on one side. ? A - Arms. Signs are weakness or numbness in an arm. This happens suddenly and usually on one side of the body. ? S - Speech. Signs are sudden trouble speaking, slurred speech, or trouble understanding what people say. ? T - Time. Time to call emergency services. Write down what time symptoms started.  You have other signs of a stroke, such as: ? A sudden, severe headache with no known cause. ? Nausea or vomiting. ? Seizure. These symptoms may represent a serious problem that is an emergency. Do not wait to see if the symptoms will go away. Get medical help right away. Call your local emergency services (911 in the [place] Do not drive yourself to the hospital. Summary  Cholesterol plaques increase your risk for heart attack and stroke. Work with your health care provider to keep your cholesterol levels in a healthy range.  Eat a healthy, balanced diet, get regular exercise, and maintain a healthy weight.  Do not use any products that contain nicotine or tobacco, such as cigarettes, e-cigarettes, and chewing tobacco.  Get help right away if you have any symptoms of a stroke. This information is  not intended to replace advice given to you by your health care provider. Make sure you discuss any questions you have with your health care provider. Document Revised: 03/17/2019 Document Reviewed: 03/17/2019 Elsevier Patient Education  2021 [employer].

## 2020-08-26 NOTE — Progress Notes (Signed)
Subjective:  Patient ID: Andrew Stuart, male    DOB: Jul 07, 1950  Age: 70 y.o. MRN: [id number]  CC: Hyperlipidemia  This visit occurred during the SARS-CoV-2 public health emergency.  Safety protocols were in place, including screening questions prior to the visit, additional usage of staff PPE, and extensive cleaning of exam room while observing appropriate contact time as indicated for disinfecting solutions.    HPI [name] presents for f/up -   He walks several miles a day and does not experience CP, DOE, palpitations, edema, or fatigue.  Outpatient Medications Prior to Visit  Medication Sig Dispense Refill  . fluticasone (FLONASE) 50 MCG/ACT nasal spray Place into both nostrils daily.    . Naproxen Sodium (ALEVE PO) Take by mouth.    Marland Kitchen HYDROcodone-acetaminophen (NORCO) 5-325 MG tablet Take 1 tablet by mouth every 6 (six) hours as needed. To be taken after surgery (Patient not taking: Reported on 08/26/2020) 20 tablet 0  . ondansetron (ZOFRAN) 4 MG tablet Take 1 tablet (4 mg total) by mouth every 8 (eight) hours as needed for nausea or vomiting. (Patient not taking: Reported on 08/26/2020) 40 tablet 0   No facility-administered medications prior to visit.    ROS Review of Systems  Constitutional: Negative.  Negative for diaphoresis and fatigue.  HENT: Negative.   Eyes: Negative.   Respiratory: Negative for cough, chest tightness, shortness of breath and wheezing.   Cardiovascular: Negative for chest pain, palpitations and leg swelling.  Gastrointestinal: Negative for abdominal pain, constipation, diarrhea, nausea and vomiting.  Endocrine: Negative.   Genitourinary: Negative for difficulty urinating, dysuria, hematuria, penile swelling, scrotal swelling and testicular pain.  Musculoskeletal: Negative.   Skin: Negative.  Negative for color change.  Neurological: Negative.  Negative for dizziness, weakness and light-headedness.  Hematological: Negative for adenopathy. Does not  bruise/bleed easily.  Psychiatric/Behavioral: Negative.     Objective:  BP 134/76 (BP Location: Left Arm, Patient Position: Sitting, Cuff Size: Large)   Pulse 76   Temp 97.9 F (36.6 C) (Oral)   Resp 16   Ht 5\' 9"  (1.753 m)   Wt 169 lb 6.4 oz (76.8 kg)   SpO2 97%   BMI 25.02 kg/m   BP Readings from Last 3 Encounters:  08/26/20 134/76  04/28/20 116/76    Wt Readings from Last 3 Encounters:  08/26/20 169 lb 6.4 oz (76.8 kg)  04/28/20 170 lb (77.1 kg)    Physical Exam Vitals reviewed.  HENT:     Nose: Nose normal.     Mouth/Throat:     Mouth: Mucous membranes are moist.  Eyes:     General: No scleral icterus.    Conjunctiva/sclera: Conjunctivae normal.  Cardiovascular:     Rate and Rhythm: Normal rate and regular rhythm.     Heart sounds: No murmur heard.   Pulmonary:     Effort: Pulmonary effort is normal.     Breath sounds: No stridor. No wheezing, rhonchi or rales.  Abdominal:     General: Abdomen is flat. Bowel sounds are normal. There is no distension.     Palpations: Abdomen is soft. There is no hepatomegaly, splenomegaly or mass.     Hernia: There is no hernia in the left inguinal area or right inguinal area.  Genitourinary:    Pubic Area: No rash.      Penis: Normal and circumcised.      Testes: Normal.     Epididymis:     Right: Normal.     Left: Normal.  Prostate: Enlarged (1+ smooth symm BPH). Not tender and no nodules present.     Rectum: Normal. Guaiac result negative. No mass, tenderness, anal fissure, external hemorrhoid or internal hemorrhoid. Normal anal tone.  Musculoskeletal:        General: Normal range of motion.     Cervical back: Neck supple.     Right lower leg: No edema.     Left lower leg: No edema.  Lymphadenopathy:     Cervical: No cervical adenopathy.     Lower Body: No right inguinal adenopathy. No left inguinal adenopathy.  Skin:    General: Skin is warm and dry.     Coloration: Skin is not pale.  Neurological:      General: No focal deficit present.     Mental Status: He is alert.  Psychiatric:        Mood and Affect: Mood normal.        Behavior: Behavior normal.     Lab Results  Component Value Date   WBC 5.6 08/26/2020   HGB 15.2 08/26/2020   HCT 44.6 08/26/2020   PLT 252.0 08/26/2020   GLUCOSE 93 08/26/2020   CHOL 219 (H) 08/26/2020   TRIG 87.0 08/26/2020   HDL 55.70 08/26/2020   LDLCALC 146 (H) 08/26/2020   ALT 30 08/26/2020   AST 24 08/26/2020   NA 140 08/26/2020   K 4.3 08/26/2020   CL 103 08/26/2020   CREATININE 0.98 08/26/2020   BUN 18 08/26/2020   CO2 31 08/26/2020   TSH 3.22 08/26/2020   PSA 2.35 08/26/2020     Assessment & Plan:   [name] was seen today for hyperlipidemia.  Diagnoses and all orders for this visit:  Hyperlipidemia with target LDL less than 130- He has an elevated ASCVD risk score so I recommended that he take a statin for CV risk reduction. -     CBC with Differential/Platelet; Future -     Basic metabolic panel; Future -     Lipid panel; Future -     Hepatic function panel; Future -     TSH; Future -     TSH -     Hepatic function panel -     Lipid panel -     Basic metabolic panel -     CBC with Differential/Platelet -     atorvastatin (LIPITOR) 20 MG tablet; Take 1 tablet (20 mg total) by mouth daily.  Need for vaccination -     Pneumococcal conjugate vaccine 20-valent (Prevnar 20)  BPH without obstruction/lower urinary tract symptoms- He has no symptoms that need to be treated and his PSA is normal which is a reassuring sign that he does not have prostate cancer. -     PSA; Future -     Urinalysis, Routine w reflex microscopic; Future -     Urinalysis, Routine w reflex microscopic -     PSA  Need for shingles vaccine -     Zoster Vaccine Adjuvanted [facility]) injection; Inject 0.5 mLs into the muscle once for 1 dose.   I have discontinued [name] Rawson's HYDROcodone-acetaminophen and ondansetron. I am also having him start on Shingrix and  atorvastatin. Additionally, I am having him maintain his Naproxen Sodium (ALEVE PO) and fluticasone.  Meds ordered this encounter  Medications  . Zoster Vaccine Adjuvanted [facility]) injection    Sig: Inject 0.5 mLs into the muscle once for 1 dose.    Dispense:  0.5 mL    Refill:  1  .  atorvastatin (LIPITOR) 20 MG tablet    Sig: Take 1 tablet (20 mg total) by mouth daily.    Dispense:  90 tablet    Refill:  1     Follow-up: Return in about 6 months (around 02/25/2021).  Sanda Linger, MD

## 2020-08-27 ENCOUNTER — Encounter: Payer: Self-pay | Admitting: Internal Medicine

## 2020-10-14 ENCOUNTER — Telehealth: Payer: Self-pay | Admitting: Orthopaedic Surgery

## 2020-10-14 NOTE — Telephone Encounter (Signed)
Pt is calling about surgery. He said he has left several messages.   CB [phone]

## 2020-10-22 ENCOUNTER — Encounter: Payer: Self-pay | Admitting: Orthopaedic Surgery

## 2020-10-28 ENCOUNTER — Other Ambulatory Visit: Payer: Self-pay | Admitting: Internal Medicine

## 2020-10-28 DIAGNOSIS — G5602 Carpal tunnel syndrome, left upper limb: Secondary | ICD-10-CM

## 2020-11-02 ENCOUNTER — Telehealth: Payer: Self-pay | Admitting: Orthopaedic Surgery

## 2020-11-02 NOTE — Telephone Encounter (Signed)
It appears patient was scheduled for left carpal tunnel release and this had to be canceled due to him having poison oak. He is calling back to reschedule.

## 2020-11-02 NOTE — Telephone Encounter (Signed)
Pt called and asked to verify if he needs to get a new appt with Dr. Roda Shutters to check him out again prior to his surgery rescheduling. Pt originally sched for April but has called Debbie to resch but has not heard back yet so just wanted to verify no new appt was needed. The best call back number is [phone].

## 2020-11-16 ENCOUNTER — Other Ambulatory Visit: Payer: Self-pay | Admitting: Physician Assistant

## 2020-11-16 MED ORDER — HYDROCODONE-ACETAMINOPHEN 5-325 MG PO TABS: 1.0000 | ORAL_TABLET | Freq: Three times a day (TID) | ORAL | 0 refills | Status: DC | PRN

## 2020-11-16 MED ORDER — ONDANSETRON HCL 4 MG PO TABS: 4.0000 mg | ORAL_TABLET | Freq: Three times a day (TID) | ORAL | 0 refills | Status: DC | PRN

## 2020-11-18 DIAGNOSIS — G5602 Carpal tunnel syndrome, left upper limb: Secondary | ICD-10-CM | POA: Diagnosis not present

## 2020-11-25 ENCOUNTER — Ambulatory Visit (INDEPENDENT_AMBULATORY_CARE_PROVIDER_SITE_OTHER): Payer: Medicare Other | Admitting: Orthopaedic Surgery

## 2020-11-25 ENCOUNTER — Encounter: Payer: Self-pay | Admitting: Orthopaedic Surgery

## 2020-11-25 ENCOUNTER — Other Ambulatory Visit: Payer: Self-pay

## 2020-11-25 ENCOUNTER — Telehealth: Payer: Self-pay | Admitting: Orthopaedic Surgery

## 2020-11-25 DIAGNOSIS — G5602 Carpal tunnel syndrome, left upper limb: Secondary | ICD-10-CM

## 2020-11-25 DIAGNOSIS — Z9889 Other specified postprocedural states: Secondary | ICD-10-CM

## 2020-11-25 NOTE — Progress Notes (Signed)
   Post-Op Visit Note   Patient: Andrew Stuart           Date of Birth: 03/19/1951           MRN: [id number] Visit Date: 11/25/2020 PCP: [name], MD   Assessment & Plan:  Chief Complaint:  Chief Complaint  Patient presents with   Left Wrist - Routine Post Op   Visit Diagnoses:  1. Carpal tunnel syndrome on left   2. S/P carpal tunnel release     Plan: Patient is a very pleasant 70 year old gentleman who comes in today 1 week out left carpal tunnel release, date of surgery 11/18/2020.  This was for moderate to severe compression median nerve on the left.  He has been doing well.  He still notes some numbness to the hand, but he does have a moderate amount of swelling throughout the entire hand and all 5 fingers.  He has been elevating.  He is not taking anything for pain.  Examination of the left hand reveals a well-healing surgical incision with nylon sutures in place.  No evidence of infection or cellulitis.  Fingers are warm and well-perfused.  Today, the wound was cleaned and recovered.  Removable splint provided.  No heavy lifting or submerging his hand underwater for another 3 weeks.  Follow-up with [place] in 1 week's time for suture removal.  Call with concerns or questions in the meantime.  Follow-Up Instructions: Return in about 1 week (around 12/02/2020).   Orders:  No orders of the defined types were placed in this encounter.  No orders of the defined types were placed in this encounter.   Imaging: No new imaging  PMFS History: Patient Active Problem List   Diagnosis Date Noted   Hyperlipidemia with target LDL less than 130 08/26/2020   BPH without obstruction/lower urinary tract symptoms 08/26/2020   Need for shingles vaccine 08/26/2020   Carpal tunnel syndrome on left 04/28/2020   DOE (dyspnea on exertion) 04/28/2020   Past Medical History:  Diagnosis Date   Allergy    Arthritis    Hepatitis    Hyperlipidemia     Family History  Problem Relation Age of Onset    Arthritis Mother    Diabetes Mother    Early death Mother    Heart disease Mother    Hyperlipidemia Mother    Colon cancer Mother    Early death Father    Early death Sister    Colon cancer Sister    Early death Brother    Lung cancer Brother    ALS Sister     Past Surgical History:  Procedure Laterality Date   CARPAL TUNNEL RELEASE Right 2015   TONSILLECTOMY     Social History   Occupational History   Not on file  Tobacco Use   Smoking status: Never   Smokeless tobacco: Never  Vaping Use   Vaping Use: Never used  Substance and Sexual Activity   Alcohol use: Yes    Alcohol/week: 7.0 standard drinks    Types: 7 Glasses of wine per week   Drug use: Never   Sexual activity: Yes    Partners: Male    Birth control/protection: Condom

## 2020-12-02 ENCOUNTER — Other Ambulatory Visit: Payer: Self-pay

## 2020-12-02 ENCOUNTER — Encounter: Payer: Self-pay | Admitting: Orthopaedic Surgery

## 2020-12-02 ENCOUNTER — Ambulatory Visit (INDEPENDENT_AMBULATORY_CARE_PROVIDER_SITE_OTHER): Payer: Medicare Other | Admitting: Orthopaedic Surgery

## 2020-12-02 VITALS — Ht 69.0 in | Wt 169.0 lb

## 2020-12-02 DIAGNOSIS — Z9889 Other specified postprocedural states: Secondary | ICD-10-CM

## 2020-12-02 NOTE — Progress Notes (Signed)
   Post-Op Visit Note   Patient: Andrew Stuart           Date of Birth: 01-Nov-1950           MRN: [id number] Visit Date: 12/02/2020 PCP: [name], MD   Assessment & Plan:  Chief Complaint:  Chief Complaint  Patient presents with   Left Wrist - Follow-up    Left carpal tunnel release 11/18/2020   Visit Diagnoses:  1. S/P carpal tunnel release     Plan: [name] is 2 weeks status post left carpal tunnel release.  He has already noticed a significant improvement in symptoms.  He is able to sleep all night now.  He has been working on passive range of motion of the fingers.  Surgical incision is healed.  No signs of infection.  Sutures removed.  He lacks about a centimeter of opposition from the thumb tip to the fifth metacarpal head.  He is doing well.  Gradually increase activity to the left hand over the next month or so.  We will see him back in a month for recheck.  Follow-Up Instructions: Return in about 4 weeks (around 12/30/2020).   Orders:  No orders of the defined types were placed in this encounter.  No orders of the defined types were placed in this encounter.   Imaging: No results found.  PMFS History: Patient Active Problem List   Diagnosis Date Noted   Hyperlipidemia with target LDL less than 130 08/26/2020   BPH without obstruction/lower urinary tract symptoms 08/26/2020   Need for shingles vaccine 08/26/2020   Carpal tunnel syndrome on left 04/28/2020   DOE (dyspnea on exertion) 04/28/2020   Past Medical History:  Diagnosis Date   Allergy    Arthritis    Hepatitis    Hyperlipidemia     Family History  Problem Relation Age of Onset   Arthritis Mother    Diabetes Mother    Early death Mother    Heart disease Mother    Hyperlipidemia Mother    Colon cancer Mother    Early death Father    Early death Sister    Colon cancer Sister    Early death Brother    Lung cancer Brother    ALS Sister     Past Surgical History:  Procedure Laterality Date    CARPAL TUNNEL RELEASE Right 2015   TONSILLECTOMY     Social History   Occupational History   Not on file  Tobacco Use   Smoking status: Never   Smokeless tobacco: Never  Vaping Use   Vaping Use: Never used  Substance and Sexual Activity   Alcohol use: Yes    Alcohol/week: 7.0 standard drinks    Types: 7 Glasses of wine per week   Drug use: Never   Sexual activity: Yes    Partners: Male    Birth control/protection: Condom

## 2020-12-30 ENCOUNTER — Encounter: Payer: Self-pay | Admitting: Orthopaedic Surgery

## 2020-12-30 ENCOUNTER — Other Ambulatory Visit: Payer: Self-pay

## 2020-12-30 ENCOUNTER — Ambulatory Visit (INDEPENDENT_AMBULATORY_CARE_PROVIDER_SITE_OTHER): Payer: Medicare Other | Admitting: Physician Assistant

## 2020-12-30 DIAGNOSIS — Z9889 Other specified postprocedural states: Secondary | ICD-10-CM

## 2020-12-30 DIAGNOSIS — G5602 Carpal tunnel syndrome, left upper limb: Secondary | ICD-10-CM

## 2020-12-30 NOTE — Progress Notes (Signed)
   Post-Op Visit Note   Patient: Andrew Stuart           Date of Birth: 12-07-1950           MRN: [id number] Visit Date: 12/30/2020 PCP: [name], MD   Assessment & Plan:  Chief Complaint:  Chief Complaint  Patient presents with   Left Hand - Pain   Visit Diagnoses:  1. S/P carpal tunnel release   2. Carpal tunnel syndrome on left     Plan: Patient is a pleasant 70 year old gentleman who comes in today 6 weeks status post left carpal tunnel release 11/18/2020.  He has been doing well.  No paresthesias.  He notes some stiffness in the morning but otherwise no complaints.  Examination of his left wrist reveals a fully healed surgical scar without complication.  Fingers are warm well perfused.  He is neurovascular intact distally.  At this point, he will advance with activity as tolerated.  Follow-up with [place] as needed.  Call with concerns or questions.  Follow-Up Instructions: Return if symptoms worsen or fail to improve.   Orders:  No orders of the defined types were placed in this encounter.  No orders of the defined types were placed in this encounter.   Imaging: No new imaging  PMFS History: Patient Active Problem List   Diagnosis Date Noted   Hyperlipidemia with target LDL less than 130 08/26/2020   BPH without obstruction/lower urinary tract symptoms 08/26/2020   Need for shingles vaccine 08/26/2020   Carpal tunnel syndrome on left 04/28/2020   DOE (dyspnea on exertion) 04/28/2020   Past Medical History:  Diagnosis Date   Allergy    Arthritis    Hepatitis    Hyperlipidemia     Family History  Problem Relation Age of Onset   Arthritis Mother    Diabetes Mother    Early death Mother    Heart disease Mother    Hyperlipidemia Mother    Colon cancer Mother    Early death Father    Early death Sister    Colon cancer Sister    Early death Brother    Lung cancer Brother    ALS Sister     Past Surgical History:  Procedure Laterality Date   CARPAL TUNNEL  RELEASE Right 2015   TONSILLECTOMY     Social History   Occupational History   Not on file  Tobacco Use   Smoking status: Never   Smokeless tobacco: Never  Vaping Use   Vaping Use: Never used  Substance and Sexual Activity   Alcohol use: Yes    Alcohol/week: 7.0 standard drinks    Types: 7 Glasses of wine per week   Drug use: Never   Sexual activity: Yes    Partners: Male    Birth control/protection: Condom

## 2021-01-06 ENCOUNTER — Telehealth: Payer: Self-pay | Admitting: Internal Medicine

## 2021-01-06 NOTE — Telephone Encounter (Signed)
LVM for pt to rtn my call to schedule AWV with [facility]. Please schedule this appt if pt calls the office.  °

## 2021-01-09 ENCOUNTER — Ambulatory Visit (INDEPENDENT_AMBULATORY_CARE_PROVIDER_SITE_OTHER): Payer: Medicare Other | Admitting: *Deleted

## 2021-01-09 DIAGNOSIS — Z0000 Encounter for general adult medical examination without abnormal findings: Secondary | ICD-10-CM | POA: Diagnosis not present

## 2021-01-09 NOTE — Progress Notes (Signed)
Subjective:   Andrew Stuart is a 70 y.o. male who presents for an Initial Medicare Annual Wellness Visit.  I connected with  Andrew Stuart on 01/09/21 by Herminio Heads enabled telemedicine application and verified that I am speaking with the correct person using two identifiers.   I discussed the limitations of evaluation and management by telemedicine. The patient expressed understanding and agreed to proceed.   Location of Patient: Home Location of Provider: Home Office Persons participating in visit: Andrew Stuart (patient) & Silvestre Moment, CMA  Review of Systems    Defer to PCP Cardiac Risk Factors include: none     Objective:    Today's Vitals   01/09/21 1034  PainSc: 3    There is no height or weight on file to calculate BMI.  Advanced Directives 01/09/2021  Does Patient Have a Medical Advance Directive? No;Yes  Type of Estate agent of [place];Living will  Copy of Healthcare Power of Attorney in Chart? No - copy requested  Would patient like information on creating a medical advance directive? No - Patient declined    Current Medications (verified) Outpatient Encounter Medications as of 01/09/2021  Medication Sig   atorvastatin (LIPITOR) 20 MG tablet Take 1 tablet (20 mg total) by mouth daily.   fluticasone (FLONASE) 50 MCG/ACT nasal spray Place into both nostrils daily.   Naproxen Sodium (ALEVE PO) Take by mouth.   No facility-administered encounter medications on file as of 01/09/2021.    Allergies (verified) Bee pollen, Blueberry flavor, and Strawberry extract   History: Past Medical History:  Diagnosis Date   Allergy    Arthritis    Hepatitis    Hyperlipidemia    Past Surgical History:  Procedure Laterality Date   CARPAL TUNNEL RELEASE Right 2015   TONSILLECTOMY     Family History  Problem Relation Age of Onset   Arthritis Mother    Diabetes Mother    Early death Mother    Heart disease Mother    Hyperlipidemia Mother    Colon cancer Mother     Early death Father    Early death Sister    Colon cancer Sister    Early death Brother    Lung cancer Brother    ALS Sister    Social History   Socioeconomic History   Marital status: Married    Spouse name: Not on file   Number of children: Not on file   Years of education: Not on file   Highest education level: Not on file  Occupational History   Not on file  Tobacco Use   Smoking status: Never   Smokeless tobacco: Never  Vaping Use   Vaping Use: Never used  Substance and Sexual Activity   Alcohol use: Yes    Alcohol/week: 7.0 standard drinks    Types: 7 Glasses of wine per week   Drug use: Never   Sexual activity: Yes    Partners: Male    Birth control/protection: Condom  Other Topics Concern   Not on file  Social History Narrative   Not on file   Social Determinants of Health   Financial Resource Strain: Low Risk    Difficulty of Paying Living Expenses: Not hard at all  Food Insecurity: No Food Insecurity   Worried About Programme researcher, broadcasting/film/video in the Last Year: Never true   Ran Out of Food in the Last Year: Never true  Transportation Needs: No Transportation Needs   Lack of Transportation (Medical): No   Lack  of Transportation (Non-Medical): No  Physical Activity: Insufficiently Active   Days of Exercise per Week: 3 days   Minutes of Exercise per Session: 30 min  Stress: No Stress Concern Present   Feeling of Stress : Not at all  Social Connections: Moderately Isolated   Frequency of Communication with Friends and Family: More than three times a week   Frequency of Social Gatherings with Friends and Family: Once a week   Attends Religious Services: Never   Database administrator or Organizations: No   Attends Engineer, structural: Never   Marital Status: Married    Tobacco Counseling Counseling given: Not Answered   Clinical Intake:     Pain : 0-10 Pain Score: 3  Pain Type: Chronic pain Pain Location: Back Pain Orientation: Lower Pain  Onset: More than a month ago Pain Frequency: Occasional     BMI - recorded: 25.02 Nutritional Status: BMI 25 -29 Overweight Diabetes: No  How often do you need to have someone help you when you read instructions, pamphlets, or other written materials from your doctor or pharmacy?: 1 - Never  Diabetic? No  Interpreter Needed?: No      Activities of Daily Living In your present state of health, do you have any difficulty performing the following activities: 01/09/2021 04/28/2020  Hearing? N N  Vision? N N  Difficulty concentrating or making decisions? N N  Walking or climbing stairs? N N  Dressing or bathing? N N  Doing errands, shopping? N N  Preparing Food and eating ? N -  Using the Toilet? N -  In the past six months, have you accidently leaked urine? N -  Do you have problems with loss of bowel control? N -  Managing your Medications? N -  Managing your Finances? N -  Housekeeping or managing your Housekeeping? N -    Patient Care Team: [name], MD as PCP - General (Internal Medicine)  Indicate any recent Medical Services you may have received from other than [facility] providers in the past year (date may be approximate).     Assessment:   This is a routine wellness examination for Andrew Stuart.  Hearing/Vision screen No results found.  Dietary issues and exercise activities discussed: Current Exercise Habits: Home exercise routine, Type of exercise: walking, Time (Minutes): 30, Frequency (Times/Week): 3, Weekly Exercise (Minutes/Week): 90, Intensity: Mild, Exercise limited by: None identified   Goals Addressed   None    Depression Screen PHQ 2/9 Scores 08/26/2020 04/28/2020  PHQ - 2 Score 0 0    Fall Risk Fall Risk  01/09/2021 08/26/2020  Falls in the past year? 0 0  Number falls in past yr: 0 0  Injury with Fall? 0 0    FALL RISK PREVENTION PERTAINING TO THE HOME:  Any stairs in or around the home? Yes  If so, are there any without handrails? No  Home  free of loose throw rugs in walkways, pet beds, electrical cords, etc? Yes  Adequate lighting in your home to reduce risk of falls? Yes   ASSISTIVE DEVICES UTILIZED TO PREVENT FALLS:  Life alert? No  Use of a cane, walker or w/c? No  Grab bars in the bathroom? No  Shower chair or bench in shower? No  Elevated toilet seat or a handicapped toilet? No   TIMED UP AND GO:  Was the test performed?  N/A .  Length of time to ambulate 10 feet: N/A sec.   Appearance of Gait: Unable  to Assess  Cognitive Function:     6CIT Screen 01/09/2021  What Year? 0 points  What month? 0 points  What time? 0 points  Count back from 20 0 points  Months in reverse 0 points  Repeat phrase 2 points  Total Score 2    Immunizations Immunization History  Administered Date(s) Administered   Influenza-Unspecified 12/31/2019   PFIZER(Purple Top)SARS-COV-2 Vaccination 02/27/2020, 08/02/2020   PNEUMOCOCCAL CONJUGATE-20 08/26/2020   Pneumococcal Polysaccharide-23 05/01/2018   Tdap 03/02/2015    TDAP status: Up to date  Flu Vaccine status: Due, Education has been provided regarding the importance of this vaccine. Advised may receive this vaccine at local pharmacy or Health Dept. Aware to provide a copy of the vaccination record if obtained from local pharmacy or Health Dept. Verbalized acceptance and understanding.  Pneumococcal vaccine status: Up to date  Covid-19 vaccine status: Information provided on how to obtain vaccines.   Qualifies for Shingles Vaccine? Yes   Zostavax completed No   Shingrix Completed?: No.    Education has been provided regarding the importance of this vaccine. Patient has been advised to call insurance company to determine out of pocket expense if they have not yet received this vaccine. Advised may also receive vaccine at local pharmacy or Health Dept. Verbalized acceptance and understanding.  Screening Tests Health Maintenance  Topic Date Due   COVID-19 Vaccine (3 -  Booster for Pfizer series) 01/25/2021 (Originally 01/02/2021)   Zoster Vaccines- Shingrix (1 of 2) 04/10/2021 (Originally 05/03/2001)   INFLUENZA VACCINE  07/29/2021 (Originally 11/29/2020)   Hepatitis C Screening  01/09/2022 (Originally 05/03/1968)   PNA vac Low Risk Adult (2 of 2 - PCV13) 08/26/2021   TETANUS/TDAP  03/01/2025   COLONOSCOPY (Pts 45-4940yrs Insurance coverage will need to be confirmed)  04/13/2025   HPV VACCINES  Aged Out    Health Maintenance  There are no preventive care reminders to display for this patient.   Colorectal cancer screening: Type of screening: Colonoscopy. Completed 04/14/2015. Repeat every 10 years  Lung Cancer Screening: (Low Dose CT Chest recommended if Age 69-80 years, 30 pack-year currently smoking OR have quit w/in 15years.) does not qualify.   Lung Cancer Screening Referral: N/A  Additional Screening:  Hepatitis C Screening: does qualify; Not completed   Vision Screening: Recommended annual ophthalmology exams for early detection of glaucoma and other disorders of the eye. Is the patient up to date with their annual eye exam?  Yes  Who is the provider or what is the name of the office in which the patient attends annual eye exams? Unable to recall If pt is not established with a provider, would they like to be referred to a provider to establish care? Yes   Dental Screening: Recommended annual dental exams for proper oral hygiene  Community Resource Referral / Chronic Care Management: CRR required this visit?  No   CCM required this visit?  No      Plan:     I have personally reviewed and noted the following in the patient's chart:   Medical and social history Use of alcohol, tobacco or illicit drugs  Current medications and supplements including opioid prescriptions. Patient is not currently taking opioid prescriptions. Functional ability and status Nutritional status Physical activity Advanced directives List of other  physicians Hospitalizations, surgeries, and ER visits in previous 12 months Vitals Screenings to include cognitive, depression, and falls Referrals and appointments  In addition, I have reviewed and discussed with patient certain preventive protocols, quality metrics,  and best practice recommendations. A written personalized care plan for preventive services as well as general preventive health recommendations were provided to patient.     Thurmond Butts, CMA   01/09/2021   Nurse Notes: 16 minutes non face to face  . Mr. Lanese , Thank you for taking time to come for your Medicare Wellness Visit. I appreciate your ongoing commitment to your health goals. Please review the following plan we discussed and let me know if I can assist you in the future.   These are the goals we discussed:  Goals   None     This is a list of the screening recommended for you and due dates:  Health Maintenance  Topic Date Due   COVID-19 Vaccine (3 - Booster for Pfizer series) 01/25/2021*   Zoster (Shingles) Vaccine (1 of 2) 04/10/2021*   Flu Shot  07/29/2021*   Hepatitis C Screening: USPSTF Recommendation to screen - Ages 18-79 yo.  01/09/2022*   Pneumonia vaccines (2 of 2 - PCV13) 08/26/2021   Tetanus Vaccine  03/01/2025   Colon Cancer Screening  04/13/2025   HPV Vaccine  Aged Out  *Topic was postponed. The date shown is not the original due date.

## 2021-01-09 NOTE — Patient Instructions (Signed)
Health Maintenance, Male Adopting a healthy lifestyle and getting preventive care are important in promoting health and wellness. Ask your health care provider about: The right schedule for you to have regular tests and exams. Things you can do on your own to prevent diseases and keep yourself healthy. What should I know about diet, weight, and exercise? Eat a healthy diet  Eat a diet that includes plenty of vegetables, fruits, low-fat dairy products, and lean protein. Do not eat a lot of foods that are high in solid fats, added sugars, or sodium. Maintain a healthy weight Body mass index (BMI) is a measurement that can be used to identify possible weight problems. It estimates body fat based on height and weight. Your health care provider can help determine your BMI and help you achieve or maintain a healthy weight. Get regular exercise Get regular exercise. This is one of the most important things you can do for your health. Most adults should: Exercise for at least 150 minutes each week. The exercise should increase your heart rate and make you sweat (moderate-intensity exercise). Do strengthening exercises at least twice a week. This is in addition to the moderate-intensity exercise. Spend less time sitting. Even light physical activity can be beneficial. Watch cholesterol and blood lipids Have your blood tested for lipids and cholesterol at 69 years of age, then have this test every 5 years. You may need to have your cholesterol levels checked more often if: Your lipid or cholesterol levels are high. You are older than 69 years of age. You are at high risk for heart disease. What should I know about cancer screening? Many types of cancers can be detected early and may often be prevented. Depending on your health history and family history, you may need to have cancer screening at various ages. This may include screening for: Colorectal cancer. Prostate cancer. Skin cancer. Lung  cancer. What should I know about heart disease, diabetes, and high blood pressure? Blood pressure and heart disease High blood pressure causes heart disease and increases the risk of stroke. This is more likely to develop in people who have high blood pressure readings, are of African descent, or are overweight. Talk with your health care provider about your target blood pressure readings. Have your blood pressure checked: Every 3-5 years if you are 18-39 years of age. Every year if you are 40 years old or older. If you are between the ages of 65 and 75 and are a current or former smoker, ask your health care provider if you should have a one-time screening for abdominal aortic aneurysm (AAA). Diabetes Have regular diabetes screenings. This checks your fasting blood sugar level. Have the screening done: Once every three years after age 45 if you are at a normal weight and have a low risk for diabetes. More often and at a younger age if you are overweight or have a high risk for diabetes. What should I know about preventing infection? Hepatitis B If you have a higher risk for hepatitis B, you should be screened for this virus. Talk with your health care provider to find out if you are at risk for hepatitis B infection. Hepatitis C Blood testing is recommended for: Everyone born from 1945 through 1965. Anyone with known risk factors for hepatitis C. Sexually transmitted infections (STIs) You should be screened each year for STIs, including gonorrhea and chlamydia, if: You are sexually active and are younger than 69 years of age. You are older than 69 years   of age and your health care provider tells you that you are at risk for this type of infection. Your sexual activity has changed since you were last screened, and you are at increased risk for chlamydia or gonorrhea. Ask your health care provider if you are at risk. Ask your health care provider about whether you are at high risk for HIV.  Your health care provider may recommend a prescription medicine to help prevent HIV infection. If you choose to take medicine to prevent HIV, you should first get tested for HIV. You should then be tested every 3 months for as long as you are taking the medicine. Follow these instructions at home: Lifestyle Do not use any products that contain nicotine or tobacco, such as cigarettes, e-cigarettes, and chewing tobacco. If you need help quitting, ask your health care provider. Do not use street drugs. Do not share needles. Ask your health care provider for help if you need support or information about quitting drugs. Alcohol use Do not drink alcohol if your health care provider tells you not to drink. If you drink alcohol: Limit how much you have to 0-2 drinks a day. Be aware of how much alcohol is in your drink. In the [place] one drink equals one 12 oz bottle of beer (355 mL), one 5 oz glass of wine (148 mL), or one 1 oz glass of hard liquor (44 mL). General instructions Schedule regular health, dental, and eye exams. Stay current with your vaccines. Tell your health care provider if: You often feel depressed. You have ever been abused or do not feel safe at home. Summary Adopting a healthy lifestyle and getting preventive care are important in promoting health and wellness. Follow your health care provider's instructions about healthy diet, exercising, and getting tested or screened for diseases. Follow your health care provider's instructions on monitoring your cholesterol and blood pressure. This information is not intended to replace advice given to you by your health care provider. Make sure you discuss any questions you have with your health care provider. Document Revised: 06/25/2020 Document Reviewed: 04/10/2018 Elsevier Patient Education  2022 [employer]  

## 2021-02-16 ENCOUNTER — Encounter: Payer: Self-pay | Admitting: Internal Medicine

## 2021-02-17 ENCOUNTER — Other Ambulatory Visit: Payer: Self-pay

## 2021-02-17 ENCOUNTER — Ambulatory Visit (INDEPENDENT_AMBULATORY_CARE_PROVIDER_SITE_OTHER): Payer: Medicare Other | Admitting: Internal Medicine

## 2021-02-17 ENCOUNTER — Encounter: Payer: Self-pay | Admitting: Internal Medicine

## 2021-02-17 ENCOUNTER — Ambulatory Visit (INDEPENDENT_AMBULATORY_CARE_PROVIDER_SITE_OTHER): Payer: Medicare Other

## 2021-02-17 VITALS — BP 138/72 | HR 71 | Temp 98.2°F | Ht 69.0 in | Wt 169.2 lb

## 2021-02-17 DIAGNOSIS — I872 Venous insufficiency (chronic) (peripheral): Secondary | ICD-10-CM | POA: Diagnosis not present

## 2021-02-17 DIAGNOSIS — R202 Paresthesia of skin: Secondary | ICD-10-CM

## 2021-02-17 DIAGNOSIS — R270 Ataxia, unspecified: Secondary | ICD-10-CM | POA: Insufficient documentation

## 2021-02-17 DIAGNOSIS — E538 Deficiency of other specified B group vitamins: Secondary | ICD-10-CM

## 2021-02-17 DIAGNOSIS — E559 Vitamin D deficiency, unspecified: Secondary | ICD-10-CM

## 2021-02-17 DIAGNOSIS — R29898 Other symptoms and signs involving the musculoskeletal system: Secondary | ICD-10-CM | POA: Insufficient documentation

## 2021-02-17 LAB — VITAMIN D 25 HYDROXY (VIT D DEFICIENCY, FRACTURES): VITD: 43.34 ng/mL (ref 30.00–100.00)

## 2021-02-17 LAB — SEDIMENTATION RATE: Sed Rate: 2 mm/hr (ref 0–20)

## 2021-02-17 LAB — VITAMIN B12: Vitamin B-12: <50 pg/mL

## 2021-02-17 LAB — CK: Total CK: 131 U/L (ref 7–232)

## 2021-02-17 MED ORDER — METHYLPREDNISOLONE 4 MG PO TBPK: ORAL_TABLET | ORAL | 0 refills | Status: DC

## 2021-02-17 NOTE — Assessment & Plan Note (Signed)
Subjective sx's - new.  His exam did not reveal movement disorder, however was accomplished at rest.  His symptoms are triggered after a walk. Rest more Hold Lipitor x 3 weeks LS X ray Check B12, CK, vitamin D, sed rate Sister died of ALS recently - she was 71 when she was diagnosed.... Medrol pack F/u w/Dr Jones in 2 wks 

## 2021-02-17 NOTE — Progress Notes (Signed)
Subjective:  Patient ID: Andrew Stuart, male    DOB: December 31, 1950  Age: 70 y.o. MRN: [id number]  CC: Equilibrium  and equilibrium  (PT STATES HE HAS BEEN OFF BALANCE)   HPI Edu On presents for severe pain in the back (chronic)  C/o LBP - B feet w/tingling, weakness in the legs- after walking a block.  On occasion a foot would point outside while he is trying to make a step forward.  No falls. No serious LBP. Some muscle pain in the lower extremities is present. Feeling stiff in AM.  No muscle twitching or tremor. Sister died of ALS recently - she was 61 when she was diagnosed....  Outpatient Medications Prior to Visit  Medication Sig Dispense Refill   atorvastatin (LIPITOR) 20 MG tablet Take 1 tablet (20 mg total) by mouth daily. 90 tablet 1   fluticasone (FLONASE) 50 MCG/ACT nasal spray Place into both nostrils daily.     Naproxen Sodium (ALEVE PO) Take by mouth.     No facility-administered medications prior to visit.    ROS: Review of Systems  Constitutional:  Negative for appetite change, fatigue, fever and unexpected weight change.  HENT:  Negative for congestion, nosebleeds, sneezing, sore throat and trouble swallowing.   Eyes:  Negative for itching and visual disturbance.  Respiratory:  Negative for cough.   Cardiovascular:  Positive for leg swelling. Negative for chest pain and palpitations.  Gastrointestinal:  Negative for abdominal distention, blood in stool, diarrhea and nausea.  Genitourinary:  Negative for frequency and hematuria.  Musculoskeletal:  Negative for back pain, gait problem, joint swelling and neck pain.  Skin:  Positive for color change. Negative for rash and wound.  Neurological:  Positive for weakness and numbness. Negative for dizziness, tremors, seizures, facial asymmetry, speech difficulty and headaches.  Psychiatric/Behavioral:  Negative for agitation, dysphoric mood and sleep disturbance. The patient is not nervous/anxious.    Objective:  BP 138/72  (BP Location: Left Arm)   Pulse 71   Temp 98.2 F (36.8 C) (Oral)   Ht $R'5\' 9"'TS$  (1.753 m)   Wt 169 lb 3.2 oz (76.7 kg)   SpO2 98%   BMI 24.99 kg/m   BP Readings from Last 3 Encounters:  02/17/21 138/72  08/26/20 134/76  04/28/20 116/76    Wt Readings from Last 3 Encounters:  02/17/21 169 lb 3.2 oz (76.7 kg)  12/02/20 169 lb (76.7 kg)  08/26/20 169 lb 6.4 oz (76.8 kg)    Physical Exam Constitutional:      General: He is not in acute distress.    Appearance: Normal appearance. He is well-developed.     Comments: NAD  Eyes:     Conjunctiva/sclera: Conjunctivae normal.     Pupils: Pupils are equal, round, and reactive to light.  Neck:     Thyroid: No thyromegaly.     Vascular: No JVD.  Cardiovascular:     Rate and Rhythm: Normal rate and regular rhythm.     Heart sounds: Normal heart sounds. No murmur heard.   No friction rub. No gallop.  Pulmonary:     Effort: Pulmonary effort is normal. No respiratory distress.     Breath sounds: Normal breath sounds. No wheezing or rales.  Chest:     Chest wall: No tenderness.  Abdominal:     General: Bowel sounds are normal. There is no distension.     Palpations: Abdomen is soft. There is no mass.     Tenderness: There is no abdominal tenderness.  There is no guarding or rebound.  Musculoskeletal:        General: No tenderness. Normal range of motion.     Cervical back: Normal range of motion.     Right lower leg: Edema present.     Left lower leg: Edema present.  Lymphadenopathy:     Cervical: No cervical adenopathy.  Skin:    General: Skin is warm and dry.     Findings: No rash.  Neurological:     Mental Status: He is alert and oriented to person, place, and time.     Cranial Nerves: No cranial nerve deficit.     Motor: No weakness or abnormal muscle tone.     Coordination: Coordination normal.     Gait: Gait normal.     Deep Tendon Reflexes: Reflexes are normal and symmetric.  Psychiatric:        Behavior: Behavior  normal.        Thought Content: Thought content normal.        Judgment: Judgment normal.   DTRs B legs w/hyperefexia MS LE OK Lower extremities with trace edema bilaterally, spider veins, hyperpigmented distal shins Romberg is negative Babinski normal bilaterally    A total time of 45 minutes was spent preparing to see the patient, reviewing tests, x-rays, operative reports and outside records.  Also, obtaining history and performing comprehensive physical exam.  Additionally, counseling the patient regarding the above listed issues and differential diagnosis.   Finally, documenting clinical information in the health records, coordination of care, educating the patient. It is a complex case.   Lab Results  Component Value Date   WBC 5.6 08/26/2020   HGB 15.2 08/26/2020   HCT 44.6 08/26/2020   PLT 252.0 08/26/2020   GLUCOSE 93 08/26/2020   CHOL 219 (H) 08/26/2020   TRIG 87.0 08/26/2020   HDL 55.70 08/26/2020   LDLCALC 146 (H) 08/26/2020   ALT 30 08/26/2020   AST 24 08/26/2020   NA 140 08/26/2020   K 4.3 08/26/2020   CL 103 08/26/2020   CREATININE 0.98 08/26/2020   BUN 18 08/26/2020   CO2 31 08/26/2020   TSH 3.22 08/26/2020   PSA 2.35 08/26/2020    Patient was never admitted.  Assessment & Plan:   Problem List Items Addressed This Visit     Ataxia    Subjective sx's - new Rest more Hold Lipitor x 3 weeks LS X ray Check B12, CK Sister died of ALS recently - she was 82 when she was diagnosed.... Medrol pack F/u w/Dr Ronnald Ramp in 2 wks      Relevant Orders   DG Lumbar Spine 2-3 Views   CK   Vitamin B12   Sedimentation rate   Chronic venous insufficiency    Compression socks      Paresthesias    New. R/o spinal stenosis vs other Check ESR, B12      Relevant Orders   DG Lumbar Spine 2-3 Views   CK   Vitamin B12   Sedimentation rate   Other Visit Diagnoses     Vitamin D deficiency    -  Primary   Relevant Orders   VITAMIN D 25 Hydroxy (Vit-D  Deficiency, Fractures)         Meds ordered this encounter  Medications   methylPREDNISolone (MEDROL DOSEPAK) 4 MG TBPK tablet    Sig: As directed    Dispense:  21 tablet    Refill:  0      Follow-up: Return  in about 2 weeks (around 03/03/2021) for a follow-up visit.  Walker Kehr, MD

## 2021-02-17 NOTE — Assessment & Plan Note (Addendum)
Subjective sx's - new.  His exam did not reveal movement disorder, however was accomplished at rest.  His symptoms are triggered after a walk. Rest more Hold Lipitor x 3 weeks LS X ray Check B12, CK, vitamin D, sed rate Sister died of ALS recently - she was 72 when she was diagnosed.... Medrol pack F/u w/Dr Yetta Barre in 2 wks

## 2021-02-17 NOTE — Assessment & Plan Note (Addendum)
New. R/o spinal stenosis vs other Check ESR, B12 See above.  He will probably need a lumbar spine MRI

## 2021-02-17 NOTE — Patient Instructions (Addendum)
Hold Lipitor x 3 weeks  Cardiac CT calcium scoring test $99 Tel # is [phone]   Computed tomography, more commonly known as a CT or CAT scan, is a diagnostic medical imaging test. Like traditional x-rays, it produces multiple images or pictures of the inside of the body. The cross-sectional images generated during a CT scan can be reformatted in multiple planes. They can even generate three-dimensional images. These images can be viewed on a computer monitor, printed on film or by a 3D printer, or transferred to a CD or DVD. CT images of internal organs, bones, soft tissue and blood vessels provide greater detail than traditional x-rays, particularly of soft tissues and blood vessels. A cardiac CT scan for coronary calcium is a non-invasive way of obtaining information about the presence, location and extent of calcified plaque in the coronary arteries--the vessels that supply oxygen-containing blood to the heart muscle. Calcified plaque results when there is a build-up of fat and other substances under the inner layer of the artery. This material can calcify which signals the presence of atherosclerosis, a disease of the vessel wall, also called coronary artery disease (CAD). People with this disease have an increased risk for heart attacks. In addition, over time, progression of plaque build up (CAD) can narrow the arteries or even close off blood flow to the heart. The result may be chest pain, sometimes called "angina," or a heart attack. Because calcium is a marker of CAD, the amount of calcium detected on a cardiac CT scan is a helpful prognostic tool. The findings on cardiac CT are expressed as a calcium score. Another name for this test is coronary artery calcium scoring.  What are some common uses of the procedure? The goal of cardiac CT scan for calcium scoring is to determine if CAD is present and to what extent, even if there are no symptoms. It is a screening study that may be  recommended by a physician for patients with risk factors for CAD but no clinical symptoms. The major risk factors for CAD are: high blood cholesterol levels  family history of heart attacks  diabetes  high blood pressure  cigarette smoking  overweight or obese  physical inactivity   A negative cardiac CT scan for calcium scoring shows no calcification within the coronary arteries. This suggests that CAD is absent or so minimal it cannot be seen by this technique. The chance of having a heart attack over the next two to five years is very low under these circumstances. A positive test means that CAD is present, regardless of whether or not the patient is experiencing any symptoms. The amount of calcification--expressed as the calcium score--may help to predict the likelihood of a myocardial infarction (heart attack) in the coming years and helps your medical doctor or cardiologist decide whether the patient may need to take preventive medicine or undertake other measures such as diet and exercise to lower the risk for heart attack. The extent of CAD is graded according to your calcium score:  Calcium Score  Presence of CAD (coronary artery disease)  0 No evidence of CAD   1-10 Minimal evidence of CAD  11-100 Mild evidence of CAD  101-400 Moderate evidence of CAD  Over 400 Extensive evidence of CAD   Coronary artery calcium (CAC) score is a strong predictor of incident coronary heart disease (CHD) and provides predictive information beyond traditional risk factors. CAC scoring is reasonable to use in the decision to withhold, postpone, or initiate statin therapy  in intermediate-risk or selected borderline-risk asymptomatic adults (age 16-75 years and LDL-C >=70 to <190 mg/dL) who do not have diabetes or established atherosclerotic cardiovascular disease (ASCVD).* In intermediate-risk (10-year ASCVD risk >=7.5% to <20%) adults or selected borderline-risk (10-year ASCVD risk >=5% to  <7.5%) adults in whom a CAC score is measured for the purpose of making a treatment decision the following recommendations have been made:   If CAC=0, it is reasonable to withhold statin therapy and reassess in 5 to 10 years, as long as higher risk conditions are absent (diabetes mellitus, family history of premature CHD in first degree relatives (males <55 years; females <65 years), cigarette smoking, or LDL >=190 mg/dL).   If CAC is 1 to 99, it is reasonable to initiate statin therapy for patients >=55 years of age.   If CAC is >=100 or >=75th percentile, it is reasonable to initiate statin therapy at any age.   Cardiology referral should be considered for patients with CAC scores >=400 or >=75th percentile.   *2018 AHA/ACC/AACVPR/AAPA/ABC/ACPM/ADA/AGS/APhA/ASPC/NLA/PCNA Guideline on the Management of Blood Cholesterol: A Report of the American College of Cardiology/American Heart Association Task Force on Clinical Practice Guidelines. J Am Coll Cardiol. 2019;73(24):3168-3209.

## 2021-02-17 NOTE — Assessment & Plan Note (Addendum)
Chronic.  Use compression socks

## 2021-02-18 DIAGNOSIS — E538 Deficiency of other specified B group vitamins: Secondary | ICD-10-CM | POA: Insufficient documentation

## 2021-02-18 NOTE — Assessment & Plan Note (Signed)
New. Vitamin B12 level is very low - start  on vitamin B12 shots.  F/u w/ Dr. Yetta Barre or myself next week.  In the meantime  get liquid vitamin B-12 and use 5000 mcg sublingually every day.

## 2021-02-19 ENCOUNTER — Other Ambulatory Visit: Payer: Self-pay | Admitting: Internal Medicine

## 2021-02-19 DIAGNOSIS — E785 Hyperlipidemia, unspecified: Secondary | ICD-10-CM

## 2021-02-20 ENCOUNTER — Other Ambulatory Visit: Payer: Self-pay | Admitting: Internal Medicine

## 2021-02-20 DIAGNOSIS — E785 Hyperlipidemia, unspecified: Secondary | ICD-10-CM

## 2021-02-23 ENCOUNTER — Ambulatory Visit (INDEPENDENT_AMBULATORY_CARE_PROVIDER_SITE_OTHER): Payer: Medicare Other

## 2021-02-23 ENCOUNTER — Other Ambulatory Visit: Payer: Self-pay

## 2021-02-23 DIAGNOSIS — E538 Deficiency of other specified B group vitamins: Secondary | ICD-10-CM

## 2021-02-23 MED ORDER — CYANOCOBALAMIN 1000 MCG/ML IJ SOLN
1000.0000 ug | Freq: Once | INTRAMUSCULAR | Status: AC
  Administered 2021-02-23: 1000 ug via INTRAMUSCULAR

## 2021-02-23 NOTE — Progress Notes (Signed)
B12 given w/o any complications. 

## 2021-03-01 ENCOUNTER — Encounter: Payer: Self-pay | Admitting: Internal Medicine

## 2021-03-01 ENCOUNTER — Ambulatory Visit (INDEPENDENT_AMBULATORY_CARE_PROVIDER_SITE_OTHER): Payer: Medicare Other | Admitting: Internal Medicine

## 2021-03-01 ENCOUNTER — Other Ambulatory Visit: Payer: Self-pay

## 2021-03-01 VITALS — BP 136/78 | HR 67 | Temp 98.4°F | Ht 69.0 in | Wt 168.0 lb

## 2021-03-01 DIAGNOSIS — R5383 Other fatigue: Secondary | ICD-10-CM | POA: Diagnosis not present

## 2021-03-01 DIAGNOSIS — R0782 Intercostal pain: Secondary | ICD-10-CM | POA: Diagnosis not present

## 2021-03-01 DIAGNOSIS — R9431 Abnormal electrocardiogram [ECG] [EKG]: Secondary | ICD-10-CM | POA: Insufficient documentation

## 2021-03-01 DIAGNOSIS — R0609 Other forms of dyspnea: Secondary | ICD-10-CM

## 2021-03-01 DIAGNOSIS — E538 Deficiency of other specified B group vitamins: Secondary | ICD-10-CM | POA: Insufficient documentation

## 2021-03-01 DIAGNOSIS — N410 Acute prostatitis: Secondary | ICD-10-CM | POA: Insufficient documentation

## 2021-03-01 DIAGNOSIS — G320 Subacute combined degeneration of spinal cord in diseases classified elsewhere: Secondary | ICD-10-CM | POA: Insufficient documentation

## 2021-03-01 LAB — CBC WITH DIFFERENTIAL/PLATELET
Basophils Absolute: 0 10*3/uL (ref 0.0–0.1)
Basophils Relative: 0.3 % (ref 0.0–3.0)
Eosinophils Absolute: 0.1 10*3/uL (ref 0.0–0.7)
Eosinophils Relative: 2 % (ref 0.0–5.0)
HCT: 41.9 % (ref 39.0–52.0)
Hemoglobin: 14.3 g/dL (ref 13.0–17.0)
Lymphocytes Relative: 23.8 % (ref 12.0–46.0)
Lymphs Abs: 1.5 10*3/uL (ref 0.7–4.0)
MCHC: 34.1 g/dL (ref 30.0–36.0)
MCV: 97 fl (ref 78.0–100.0)
Monocytes Absolute: 0.4 10*3/uL (ref 0.1–1.0)
Monocytes Relative: 7 % (ref 3.0–12.0)
Neutro Abs: 4.2 10*3/uL (ref 1.4–7.7)
Neutrophils Relative %: 66.9 % (ref 43.0–77.0)
Platelets: 239 10*3/uL (ref 150.0–400.0)
RBC: 4.33 Mil/uL (ref 4.22–5.81)
RDW: 14.6 % (ref 11.5–15.5)
WBC: 6.3 10*3/uL (ref 4.0–10.5)

## 2021-03-01 LAB — FOLATE: Folate: >23.4 ng/mL (ref >5.9)

## 2021-03-01 LAB — TROPONIN I (HIGH SENSITIVITY): High Sens Troponin I: 4 ng/L (ref 2–17)

## 2021-03-01 MED ORDER — CYANOCOBALAMIN 1000 MCG/ML IJ SOLN
1000.0000 ug | Freq: Once | INTRAMUSCULAR | Status: AC
  Administered 2021-03-01: 1000 ug via INTRAMUSCULAR

## 2021-03-01 NOTE — Patient Instructions (Signed)

## 2021-03-01 NOTE — Progress Notes (Signed)
Subjective:  Patient ID: Andrew Stuart, male    DOB: August 25, 1950  Age: 70 y.o. MRN: [id number]  CC: Follow-up  This visit occurred during the SARS-CoV-2 public health emergency.  Safety protocols were in place, including screening questions prior to the visit, additional usage of staff PPE, and extensive cleaning of exam room while observing appropriate contact time as indicated for disinfecting solutions.    HPI Andrew Stuart presents for f/up -  Over the last few months he has developed incoordination in his lower extremities with numbness and tingling.  He saw someone else recently and was found to have an undetectable B12 level.  He tells me that he has received 1 B12 injection.  His symptoms have improved slightly.  He also tells me that 2 weeks ago he developed dyspnea on exertion and fatigue.  For the last 4 days he has had chest pain that he describes as a dull ache in the precordial region with some electrical shocks under the sternum.  He has also had fatigue and a few episodes of ankle edema.  He denies diaphoresis, dizziness, or lightheadedness. He also has pelvic pain.  Outpatient Medications Prior to Visit  Medication Sig Dispense Refill   atorvastatin (LIPITOR) 20 MG tablet TAKE 1 TABLET BY MOUTH EVERY DAY 90 tablet 1   fluticasone (FLONASE) 50 MCG/ACT nasal spray Place into both nostrils daily.     Naproxen Sodium (ALEVE PO) Take by mouth.     methylPREDNISolone (MEDROL DOSEPAK) 4 MG TBPK tablet As directed 21 tablet 0   No facility-administered medications prior to visit.    ROS Review of Systems  Constitutional:  Positive for fatigue. Negative for chills, diaphoresis and unexpected weight change.  HENT: Negative.    Eyes: Negative.   Respiratory:  Positive for shortness of breath. Negative for cough, choking, chest tightness, wheezing and stridor.   Cardiovascular:  Positive for chest pain and leg swelling. Negative for palpitations.  Gastrointestinal:  Negative for abdominal  pain, constipation, diarrhea, nausea and vomiting.  Endocrine: Negative.   Genitourinary: Negative.  Negative for difficulty urinating, dysuria, hematuria, testicular pain and urgency.  Musculoskeletal:  Positive for back pain (low back stiffness). Negative for arthralgias, myalgias and neck pain.  Skin: Negative.  Negative for color change, pallor and rash.  Neurological:  Positive for numbness. Negative for dizziness, syncope, weakness and headaches.  Hematological:  Negative for adenopathy. Does not bruise/bleed easily.  Psychiatric/Behavioral: Negative.     Objective:  BP 136/78 (BP Location: Right Arm, Patient Position: Sitting, Cuff Size: Large)   Pulse 67   Temp 98.4 F (36.9 C) (Oral)   Ht 5\' 9"  (1.753 m)   Wt 168 lb (76.2 kg)   SpO2 98%   BMI 24.81 kg/m   BP Readings from Last 3 Encounters:  03/01/21 136/78  02/17/21 138/72  08/26/20 134/76    Wt Readings from Last 3 Encounters:  03/01/21 168 lb (76.2 kg)  02/17/21 169 lb 3.2 oz (76.7 kg)  12/02/20 169 lb (76.7 kg)    Physical Exam Vitals reviewed.  HENT:     Mouth/Throat:     Mouth: Mucous membranes are moist.  Eyes:     Conjunctiva/sclera: Conjunctivae normal.  Cardiovascular:     Rate and Rhythm: Normal rate and regular rhythm.     Heart sounds: Normal heart sounds, S1 normal and S2 normal. No murmur heard.   No friction rub. No gallop.     Comments: EKG- NSR, 66 bpm Septal infarct pattern is  new Pulmonary:     Effort: Pulmonary effort is normal.     Breath sounds: No stridor. No wheezing, rhonchi or rales.  Abdominal:     General: Abdomen is flat. Bowel sounds are normal. There is no distension.     Palpations: Abdomen is soft. There is no hepatomegaly, splenomegaly or mass.     Tenderness: There is no abdominal tenderness.     Hernia: There is no hernia in the left inguinal area or right inguinal area.  Genitourinary:    Pubic Area: No rash.      Penis: Normal and circumcised.      Testes:  Normal.     Epididymis:     Right: Normal.     Left: Normal.     Prostate: Enlarged and tender. No nodules present.     Rectum: Normal. No mass, tenderness, anal fissure, external hemorrhoid or internal hemorrhoid. Normal anal tone.     Comments: ++ tender and boggy Musculoskeletal:        General: No swelling.     Cervical back: Neck supple.     Right lower leg: No edema.     Left lower leg: No edema.     Comments: There is no edema today  Lymphadenopathy:     Lower Body: No right inguinal adenopathy. No left inguinal adenopathy.  Skin:    General: Skin is warm and dry.  Neurological:     General: No focal deficit present.  Psychiatric:        Mood and Affect: Mood normal.        Behavior: Behavior normal.    Lab Results  Component Value Date   WBC 6.3 03/01/2021   HGB 14.3 03/01/2021   HCT 41.9 03/01/2021   PLT 239.0 03/01/2021   GLUCOSE 93 08/26/2020   CHOL 219 (H) 08/26/2020   TRIG 87.0 08/26/2020   HDL 55.70 08/26/2020   LDLCALC 146 (H) 08/26/2020   ALT 30 08/26/2020   AST 24 08/26/2020   NA 140 08/26/2020   K 4.3 08/26/2020   CL 103 08/26/2020   CREATININE 0.98 08/26/2020   BUN 18 08/26/2020   CO2 31 08/26/2020   TSH 3.22 08/26/2020   PSA 2.35 08/26/2020    Patient was never admitted.  Assessment & Plan:   Andrew Stuart was seen today for follow-up.  Diagnoses and all orders for this visit:  Intercostal pain -     Troponin I (High Sensitivity); Future -     Pro b natriuretic peptide (BNP); Future -     D-dimer, quantitative; Future -     MYOCARDIAL PERFUSION IMAGING; Future -     D-dimer, quantitative -     Pro b natriuretic peptide (BNP) -     Troponin I (High Sensitivity) -     aspirin EC 81 MG tablet; Take 1 tablet (81 mg total) by mouth daily.  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  His labs are reassuring however his EKG shows a new septal infarct pattern.  I recommended that he restart the statin, take a baby aspirin every day, and to undergo a myocardial perfusion imaging. -     MYOCARDIAL PERFUSION IMAGING; Future -     aspirin EC 81 MG tablet; Take 1 tablet (81 mg total) by mouth daily.  Acute prostatitis without hematuria -     Urinalysis, Routine w reflex microscopic; Future -     CULTURE, URINE COMPREHENSIVE; Future -     CULTURE, URINE COMPREHENSIVE -     Urinalysis, Routine w reflex microscopic -     sulfamethoxazole-trimethoprim (BACTRIM DS) 800-160 MG tablet; Take 1 tablet by mouth 2 (two) times daily.  I have discontinued Andrew Stuart methylPREDNISolone. I am also having him start on sulfamethoxazole-trimethoprim and aspirin EC. Additionally, I am having him maintain his Naproxen Sodium (ALEVE PO), fluticasone, and atorvastatin. We administered cyanocobalamin.  Meds ordered this encounter  Medications   cyanocobalamin ((VITAMIN B-12)) injection 1,000 mcg   sulfamethoxazole-trimethoprim (BACTRIM DS) 800-160 MG tablet    Sig: Take 1 tablet by mouth 2 (two) times daily.    Dispense:  60 tablet    Refill:  0   aspirin EC 81 MG tablet    Sig: Take 1 tablet (81 mg total) by mouth daily.    Dispense:  90 tablet    Refill:  1      Follow-up: Return in about 4 weeks (around 03/29/2021).  Scarlette Calico, MD

## 2021-03-02 ENCOUNTER — Encounter: Payer: Self-pay | Admitting: Internal Medicine

## 2021-03-02 LAB — URINALYSIS, ROUTINE W REFLEX MICROSCOPIC
Bilirubin Urine: NEGATIVE
Hgb urine dipstick: NEGATIVE
Ketones, ur: NEGATIVE
Leukocytes,Ua: NEGATIVE
Nitrite: NEGATIVE
RBC / HPF: NONE SEEN (ref 0–?)
Specific Gravity, Urine: <=1.005 — AB (ref 1.000–1.030)
Total Protein, Urine: NEGATIVE
Urine Glucose: NEGATIVE
Urobilinogen, UA: 0.2 (ref 0.0–1.0)
WBC, UA: NONE SEEN (ref 0–?)
pH: 6 (ref 5.0–8.0)

## 2021-03-02 LAB — PRO B NATRIURETIC PEPTIDE: NT-Pro BNP: 92 pg/mL (ref 0–376)

## 2021-03-02 MED ORDER — SULFAMETHOXAZOLE-TRIMETHOPRIM 800-160 MG PO TABS
1.0000 | ORAL_TABLET | Freq: Two times a day (BID) | ORAL | 0 refills | Status: DC
Start: 2021-03-02 — End: 2021-03-30

## 2021-03-02 MED ORDER — ASPIRIN EC 81 MG PO TBEC: 81.0000 mg | DELAYED_RELEASE_TABLET | Freq: Every day | ORAL | 1 refills | Status: DC

## 2021-03-03 LAB — CULTURE, URINE COMPREHENSIVE: RESULT:: NO GROWTH

## 2021-03-03 LAB — D-DIMER, QUANTITATIVE: D-Dimer, Quant: 0.3 mcg/mL FEU (ref <0.50)

## 2021-03-07 ENCOUNTER — Other Ambulatory Visit: Payer: Self-pay

## 2021-03-07 ENCOUNTER — Ambulatory Visit (INDEPENDENT_AMBULATORY_CARE_PROVIDER_SITE_OTHER): Payer: Medicare Other

## 2021-03-07 ENCOUNTER — Encounter: Payer: Self-pay | Admitting: Internal Medicine

## 2021-03-07 DIAGNOSIS — E538 Deficiency of other specified B group vitamins: Secondary | ICD-10-CM

## 2021-03-07 MED ORDER — CYANOCOBALAMIN 1000 MCG/ML IJ SOLN
1000.0000 ug | Freq: Once | INTRAMUSCULAR | Status: AC
Start: 2021-03-07 — End: 2021-03-07
  Administered 2021-03-07: 1000 ug via INTRAMUSCULAR

## 2021-03-07 NOTE — Progress Notes (Addendum)
Patient here for weekly B12 injection per Dr. Yetta Barre. B12 1000 mcg given Right IM and patient tolerated injection well today.   I have reviewed and agree

## 2021-03-09 ENCOUNTER — Other Ambulatory Visit: Payer: Self-pay | Admitting: Internal Medicine

## 2021-03-09 ENCOUNTER — Telehealth (HOSPITAL_COMMUNITY): Payer: Self-pay | Admitting: *Deleted

## 2021-03-09 DIAGNOSIS — R0782 Intercostal pain: Secondary | ICD-10-CM

## 2021-03-09 DIAGNOSIS — R0609 Other forms of dyspnea: Secondary | ICD-10-CM

## 2021-03-09 DIAGNOSIS — R5383 Other fatigue: Secondary | ICD-10-CM

## 2021-03-09 DIAGNOSIS — R9431 Abnormal electrocardiogram [ECG] [EKG]: Secondary | ICD-10-CM

## 2021-03-09 NOTE — Telephone Encounter (Signed)
Patient given detailed instructions per Myocardial Perfusion Study Information Sheet for the test on 03/10/21 Patient notified to arrive 15 minutes early and that it is imperative to arrive on time for appointment to keep from having the test rescheduled.  If you need to cancel or reschedule your appointment, please call the office within 24 hours of your appointment. . Patient verbalized understanding. Andrew Stuart

## 2021-03-10 ENCOUNTER — Encounter: Payer: Self-pay | Admitting: Internal Medicine

## 2021-03-10 ENCOUNTER — Ambulatory Visit (HOSPITAL_COMMUNITY): Payer: Medicare Other | Attending: Cardiology

## 2021-03-10 ENCOUNTER — Other Ambulatory Visit: Payer: Self-pay

## 2021-03-10 DIAGNOSIS — R9431 Abnormal electrocardiogram [ECG] [EKG]: Secondary | ICD-10-CM

## 2021-03-10 DIAGNOSIS — R0609 Other forms of dyspnea: Secondary | ICD-10-CM | POA: Diagnosis present

## 2021-03-10 DIAGNOSIS — R5383 Other fatigue: Secondary | ICD-10-CM

## 2021-03-10 DIAGNOSIS — R0782 Intercostal pain: Secondary | ICD-10-CM

## 2021-03-10 LAB — MYOCARDIAL PERFUSION IMAGING
LV dias vol: 61 mL (ref 62–150)
LV sys vol: 15 mL
Nuc Stress EF: 75 %
Peak HR: 120 {beats}/min
Rest HR: 71 {beats}/min
Rest Nuclear Isotope Dose: 9.3 mCi
SDS: 1
SRS: 1
SSS: 2
ST Depression (mm): 0 mm
Stress Nuclear Isotope Dose: 27.5 mCi
TID: 1.09

## 2021-03-10 MED ORDER — REGADENOSON 0.4 MG/5ML IV SOLN
0.4000 mg | Freq: Once | INTRAVENOUS | Status: AC
  Administered 2021-03-10: 0.4 mg via INTRAVENOUS

## 2021-03-10 MED ORDER — TECHNETIUM TC 99M TETROFOSMIN IV KIT
9.3000 | PACK | Freq: Once | INTRAVENOUS | Status: AC | PRN
  Administered 2021-03-10: 9.3 via INTRAVENOUS
  Filled 2021-03-10: qty 10

## 2021-03-10 MED ORDER — TECHNETIUM TC 99M TETROFOSMIN IV KIT
27.5000 | PACK | Freq: Once | INTRAVENOUS | Status: AC | PRN
  Administered 2021-03-10: 27.5 via INTRAVENOUS
  Filled 2021-03-10: qty 28

## 2021-03-11 NOTE — Addendum Note (Signed)
Addended by: Darryll Capers on: 03/11/2021 11:07 AM   Modules accepted: Orders

## 2021-03-14 ENCOUNTER — Ambulatory Visit (INDEPENDENT_AMBULATORY_CARE_PROVIDER_SITE_OTHER): Payer: Medicare Other

## 2021-03-14 ENCOUNTER — Other Ambulatory Visit: Payer: Self-pay

## 2021-03-14 DIAGNOSIS — E538 Deficiency of other specified B group vitamins: Secondary | ICD-10-CM | POA: Diagnosis not present

## 2021-03-14 MED ORDER — CYANOCOBALAMIN 1000 MCG/ML IJ SOLN
1000.0000 ug | Freq: Once | INTRAMUSCULAR | Status: AC
  Administered 2021-03-14: 1000 ug via INTRAMUSCULAR

## 2021-03-14 NOTE — Progress Notes (Signed)
Pt given B12 w/o any complications. °

## 2021-03-16 ENCOUNTER — Ambulatory Visit: Payer: Medicare Other | Admitting: Internal Medicine

## 2021-03-22 ENCOUNTER — Inpatient Hospital Stay: Admission: RE | Admit: 2021-03-22 | Payer: Medicare Other | Source: Ambulatory Visit

## 2021-03-30 ENCOUNTER — Other Ambulatory Visit: Payer: Self-pay

## 2021-03-30 ENCOUNTER — Encounter: Payer: Self-pay | Admitting: Internal Medicine

## 2021-03-30 ENCOUNTER — Ambulatory Visit (INDEPENDENT_AMBULATORY_CARE_PROVIDER_SITE_OTHER): Payer: Medicare Other | Admitting: Internal Medicine

## 2021-03-30 ENCOUNTER — Ambulatory Visit: Payer: Medicare Other

## 2021-03-30 VITALS — BP 126/78 | HR 71 | Temp 97.7°F | Ht 69.0 in | Wt 165.0 lb

## 2021-03-30 DIAGNOSIS — R102 Pelvic and perineal pain: Secondary | ICD-10-CM | POA: Insufficient documentation

## 2021-03-30 DIAGNOSIS — R21 Rash and other nonspecific skin eruption: Secondary | ICD-10-CM | POA: Diagnosis not present

## 2021-03-30 DIAGNOSIS — L270 Generalized skin eruption due to drugs and medicaments taken internally: Secondary | ICD-10-CM | POA: Diagnosis not present

## 2021-03-30 DIAGNOSIS — E538 Deficiency of other specified B group vitamins: Secondary | ICD-10-CM

## 2021-03-30 LAB — BASIC METABOLIC PANEL
BUN: 15 mg/dL (ref 6–23)
CO2: 30 mEq/L (ref 19–32)
Calcium: 10.3 mg/dL (ref 8.4–10.5)
Chloride: 101 mEq/L (ref 96–112)
Creatinine, Ser: 1.11 mg/dL (ref 0.40–1.50)
GFR: 67.57 mL/min (ref >60.00)
Glucose, Bld: 108 mg/dL — ABNORMAL HIGH (ref 70–99)
Potassium: 4.2 mEq/L (ref 3.5–5.1)
Sodium: 137 mEq/L (ref 135–145)

## 2021-03-30 LAB — CBC WITH DIFFERENTIAL/PLATELET
Basophils Absolute: 0 10*3/uL (ref 0.0–0.1)
Basophils Relative: 0.5 % (ref 0.0–3.0)
Eosinophils Absolute: 0.2 10*3/uL (ref 0.0–0.7)
Eosinophils Relative: 2.5 % (ref 0.0–5.0)
HCT: 43.1 % (ref 39.0–52.0)
Hemoglobin: 14.7 g/dL (ref 13.0–17.0)
Lymphocytes Relative: 25.8 % (ref 12.0–46.0)
Lymphs Abs: 1.7 10*3/uL (ref 0.7–4.0)
MCHC: 34.1 g/dL (ref 30.0–36.0)
MCV: 96.1 fl (ref 78.0–100.0)
Monocytes Absolute: 0.5 10*3/uL (ref 0.1–1.0)
Monocytes Relative: 7.9 % (ref 3.0–12.0)
Neutro Abs: 4.3 10*3/uL (ref 1.4–7.7)
Neutrophils Relative %: 63.3 % (ref 43.0–77.0)
Platelets: 251 10*3/uL (ref 150.0–400.0)
RBC: 4.49 Mil/uL (ref 4.22–5.81)
RDW: 13.6 % (ref 11.5–15.5)
WBC: 6.7 10*3/uL (ref 4.0–10.5)

## 2021-03-30 LAB — C-REACTIVE PROTEIN: CRP: <1 mg/dL (ref 0.5–20.0)

## 2021-03-30 MED ORDER — METHYLPREDNISOLONE 4 MG PO TBPK: ORAL_TABLET | ORAL | 0 refills | Status: AC

## 2021-03-30 MED ORDER — CYANOCOBALAMIN 1000 MCG/ML IJ SOLN
1000.0000 ug | Freq: Once | INTRAMUSCULAR | Status: AC
  Administered 2021-03-30: 1000 ug via INTRAMUSCULAR

## 2021-03-30 NOTE — Patient Instructions (Signed)
Drug Rash A drug rash occurs when a medicine causes a change in the color or texture of the skin. It can develop minutes, hours, or days after you take the medicine. The rash may appear on a small area of skin or all over your body. What are the causes? This condition may be caused by one of these three conditions: An allergic reaction to the medicine. An unwanted side effect of a certain medicine. Extreme sensitivity to sunlight caused by the medicine. What increases the risk? If you take any of these medicines that make your skin sensitive to light and are exposed to sunlight, it can make you more likely to develop this condition: Antibiotics, including tetracyclines and sulfa medicines. Antifungals. Antihistamines. Diuretics. Retinoids, such as isotretinoin. Statins. NSAIDs. What are the signs or symptoms? Symptoms of this condition include: Redness. Tiny bumps. Peeling. Itching. Itchy welts (hives). Swelling. How is this diagnosed? This condition may be diagnosed based on: A physical exam. Tests to find out which medicine caused the rash. These tests may include: Skin tests. Blood tests. How is this treated? This condition is treated with medicines, including: Antihistamine. This may be given to relieve itching. NSAIDs. These may be given to reduce swelling and to treat pain. A steroid medicine. This may be given to reduce swelling. The rash usually goes away when you stop taking the medicine that caused it. Follow these instructions at home: Take over-the-counter and prescription medicines only as told by your health care provider. Tell all your health care providers about any medicine reactions that you have had in the past. If your rash was caused by sensitivity to sunlight, and while your rash is healing: Avoid being in the sun if possible, especially when it is strongest, usually between 10 a.m. and 4 p.m. Cover your skin with pants, long sleeves, and a hat when you  are exposed to sunlight. If you have hives: Take a cool shower or use a cool compress to relieve itchiness. Take over-the-counter antihistamines, as recommended by your health care provider, until the hives are gone. Hives are not contagious. Keep all follow-up visits. This is important. Contact a health care provider if: You have fever. Your rash is not going away. Your rash gets worse. Your rash comes back. You have high-pitched whistling sounds when you breathe, most often when you breathe out (wheezing) or coughing. Get help right away if: You start to have breathing problems. You start to have shortness of breath. Your face or throat starts to swell. You have severe weakness with dizziness or fainting. You have chest pain. Your skin starts to blister and peel. These symptoms may represent a serious problem that is an emergency. Do not wait to see if the symptoms will go away. Get medical help right away. Call your local emergency services (911 in the [place] Do not drive yourself to the hospital. Summary A drug rash occurs when a medicine causes a change in the color or texture of the skin. The rash may appear on a small area of skin or all over your body. It can develop minutes, hours, or days after you take the medicine. Your health care provider will do various tests to determine what medicine caused your rash. The rash may be treated with medicine to relieve itching, swelling, and pain. This information is not intended to replace advice given to you by your health care provider. Make sure you discuss any questions you have with your health care provider. Document Revised: 09/27/2020 Document   Reviewed: 09/27/2020 Elsevier Patient Education  2022 [employer] Inc[employer]  

## 2021-03-30 NOTE — Progress Notes (Signed)
Subjective:  Patient ID: Andrew Stuart, male    DOB: 06-13-1950  Age: 70 y.o. MRN: [id number]  CC: Rash  This visit occurred during the SARS-CoV-2 public health emergency.  Safety protocols were in place, including screening questions prior to the visit, additional usage of staff PPE, and extensive cleaning of exam room while observing appropriate contact time as indicated for disinfecting solutions.    HPI Andrew Stuart presents for f/up -  He complains of a red rash on his torso and arms for about a week.  It does not cause him any symptoms.  He tells me the pelvic pain has decreased by 50% after taking Bactrim DS for a couple of weeks.  He still does, however, complain of pain down deep in his pelvis and he points to his perineum.  He denies nausea, vomiting, lymphadenopathy, abdominal pain, dysuria, or hematuria.  He has had no more episodes of chest pain or dyspnea on exertion.  Outpatient Medications Prior to Visit  Medication Sig Dispense Refill   aspirin EC 81 MG tablet Take 1 tablet (81 mg total) by mouth daily. 90 tablet 1   atorvastatin (LIPITOR) 20 MG tablet TAKE 1 TABLET BY MOUTH EVERY DAY 90 tablet 1   fluticasone (FLONASE) 50 MCG/ACT nasal spray Place into both nostrils daily.     Naproxen Sodium (ALEVE PO) Take by mouth.     sulfamethoxazole-trimethoprim (BACTRIM DS) 800-160 MG tablet Take 1 tablet by mouth 2 (two) times daily. 60 tablet 0   No facility-administered medications prior to visit.           ROS Review of Systems  Constitutional:  Negative for chills, diaphoresis, fatigue and fever.  HENT: Negative.    Eyes: Negative.   Respiratory:  Negative for cough, shortness of breath and wheezing.   Cardiovascular:  Negative for chest pain, palpitations and leg swelling.  Gastrointestinal:  Negative for abdominal pain, constipation, diarrhea, nausea and vomiting.  Endocrine: Negative.   Genitourinary: Negative.  Negative for difficulty urinating, dysuria, genital  sores, hematuria, penile discharge, penile swelling, scrotal swelling, testicular pain and urgency.  Musculoskeletal:  Negative for back pain and myalgias.  Skin:  Positive for rash.  Neurological:  Negative for dizziness, weakness, light-headedness and numbness.  Hematological:  Negative for adenopathy. Does not bruise/bleed easily.  Psychiatric/Behavioral: Negative.     Objective:  BP 126/78 (BP Location: Right Arm, Patient Position: Sitting, Cuff Size: Large)   Pulse 71   Temp 97.7 F (36.5 C) (Oral)   Ht 5\' 9"  (1.753 m)   Wt 165 lb (74.8 kg)   SpO2 98%   BMI 24.37 kg/m   BP Readings from Last 3 Encounters:  03/30/21 126/78  03/01/21 136/78  02/17/21 138/72    Wt Readings from Last 3 Encounters:  03/30/21 165 lb (74.8 kg)  03/10/21 168 lb (76.2 kg)  03/01/21 168 lb (76.2 kg)    Physical Exam Vitals reviewed.  Constitutional:      Appearance: Normal appearance.  HENT:     Nose: Nose normal.     Mouth/Throat:     Mouth: Mucous membranes are moist.  Eyes:     General: No scleral icterus.    Conjunctiva/sclera: Conjunctivae normal.  Cardiovascular:     Rate and Rhythm: Normal rate and regular rhythm.     Heart sounds: No murmur heard. Pulmonary:     Effort: Pulmonary effort is normal.     Breath sounds: No stridor. No wheezing, rhonchi or rales.  Abdominal:  General: Abdomen is flat.     Palpations: There is no mass.     Tenderness: There is no abdominal tenderness. There is no guarding.  Musculoskeletal:        General: Normal range of motion.     Cervical back: Neck supple.     Right lower leg: No edema.     Left lower leg: No edema.  Lymphadenopathy:     Cervical: No cervical adenopathy.  Skin:    General: Skin is warm and dry.     Findings: Erythema and rash present. No abrasion or abscess. Rash is macular. Rash is not crusting, nodular, papular, purpuric, pustular, scaling, urticarial or vesicular.     Comments: Dusky, erythematous macules with some  peeling noted on the upper extremities and torso.  There are about 5 lesions total.  See photo.  Neurological:     General: No focal deficit present.     Mental Status: He is alert.  Psychiatric:        Mood and Affect: Mood normal.        Behavior: Behavior normal.    Lab Results  Component Value Date   WBC 6.7 03/30/2021   HGB 14.7 03/30/2021   HCT 43.1 03/30/2021   PLT 251.0 03/30/2021   GLUCOSE 108 (H) 03/30/2021   CHOL 219 (H) 08/26/2020   TRIG 87.0 08/26/2020   HDL 55.70 08/26/2020   LDLCALC 146 (H) 08/26/2020   ALT 30 08/26/2020   AST 24 08/26/2020   NA 137 03/30/2021   K 4.2 03/30/2021   CL 101 03/30/2021   CREATININE 1.11 03/30/2021   BUN 15 03/30/2021   CO2 30 03/30/2021   TSH 3.22 08/26/2020   PSA 2.35 08/26/2020    Patient was never admitted.  Assessment & Plan:   Andrew Stuart was seen today for rash.  Diagnoses and all orders for this visit:  Vitamin B12 deficiency -     cyanocobalamin ((VITAMIN B-12)) injection 1,000 mcg -     CBC with Differential/Platelet; Future -     CBC with Differential/Platelet  Pelvic pain in male- I recommended that he undergo an MRI with and without contrast to screen for malignancy, mass, tumor, or bony lesion. -     CBC with Differential/Platelet; Future -     Basic metabolic panel; Future -     MR Pelvis W Wo Contrast; Future -     Basic metabolic panel -     CBC with Differential/Platelet  Rash- His labs are normal.  Will treat for sulfa allergy with a 6-day course of methylprednisolone.  He agrees to discontinue Bactrim DS. -     CBC with Differential/Platelet; Future -     Basic metabolic panel; Future -     RPR; Future -     C-reactive protein; Future -     C-reactive protein -     RPR -     Basic metabolic panel -     CBC with Differential/Platelet  Drug-induced skin rash -     Basic metabolic panel; Future -     methylPREDNISolone (MEDROL DOSEPAK) 4 MG TBPK tablet; TAKE AS DIRECTED -     Basic metabolic  panel  I have discontinued Andrew Stuart Stuart's sulfamethoxazole-trimethoprim. I am also having him start on methylPREDNISolone. Additionally, I am having him maintain his Naproxen Sodium (ALEVE PO), fluticasone, atorvastatin, and aspirin EC. We administered cyanocobalamin.  Meds ordered this encounter  Medications   cyanocobalamin ((VITAMIN B-12)) injection 1,000 mcg  methylPREDNISolone (MEDROL DOSEPAK) 4 MG TBPK tablet    Sig: TAKE AS DIRECTED    Dispense:  21 tablet    Refill:  0     Follow-up: Return in about 3 months (around 06/28/2021).  Scarlette Calico, MD

## 2021-03-31 LAB — RPR: RPR Ser Ql: NONREACTIVE

## 2021-04-04 ENCOUNTER — Encounter: Payer: Self-pay | Admitting: Internal Medicine

## 2021-04-14 ENCOUNTER — Ambulatory Visit (INDEPENDENT_AMBULATORY_CARE_PROVIDER_SITE_OTHER): Payer: Medicare Other

## 2021-04-14 ENCOUNTER — Other Ambulatory Visit: Payer: Self-pay

## 2021-04-14 DIAGNOSIS — E538 Deficiency of other specified B group vitamins: Secondary | ICD-10-CM

## 2021-04-14 MED ORDER — CYANOCOBALAMIN 1000 MCG/ML IJ SOLN
1000.0000 ug | Freq: Once | INTRAMUSCULAR | Status: AC
Start: 2021-04-14 — End: 2021-04-14
  Administered 2021-04-14: 1000 ug via INTRAMUSCULAR

## 2021-04-14 NOTE — Progress Notes (Signed)
Pt came into the office to receive his b12 injection. He tolerated the injection well.  °

## 2021-04-19 ENCOUNTER — Ambulatory Visit
Admission: RE | Admit: 2021-04-19 | Discharge: 2021-04-19 | Disposition: A | Discharge status: Home / Self Care | Payer: Medicare Other | Source: Ambulatory Visit | Attending: Internal Medicine | Admitting: Internal Medicine

## 2021-04-19 ENCOUNTER — Other Ambulatory Visit: Payer: Self-pay

## 2021-04-19 DIAGNOSIS — R102 Pelvic and perineal pain: Secondary | ICD-10-CM

## 2021-04-19 MED ORDER — GADOBENATE DIMEGLUMINE 529 MG/ML IV SOLN
15.0000 mL | Freq: Once | INTRAVENOUS | Status: AC | PRN
  Administered 2021-04-19: 10:00:00 15 mL via INTRAVENOUS

## 2021-04-20 ENCOUNTER — Encounter: Payer: Self-pay | Admitting: Internal Medicine

## 2021-04-20 ENCOUNTER — Other Ambulatory Visit: Payer: Self-pay | Admitting: Internal Medicine

## 2021-04-20 DIAGNOSIS — M76899 Other specified enthesopathies of unspecified lower limb, excluding foot: Secondary | ICD-10-CM | POA: Insufficient documentation

## 2021-05-06 ENCOUNTER — Encounter: Payer: Self-pay | Admitting: Orthopaedic Surgery

## 2021-05-06 ENCOUNTER — Ambulatory Visit (INDEPENDENT_AMBULATORY_CARE_PROVIDER_SITE_OTHER): Payer: Medicare HMO | Admitting: Orthopaedic Surgery

## 2021-05-06 ENCOUNTER — Other Ambulatory Visit: Payer: Self-pay

## 2021-05-06 DIAGNOSIS — S76302A Unspecified injury of muscle, fascia and tendon of the posterior muscle group at thigh level, left thigh, initial encounter: Secondary | ICD-10-CM

## 2021-05-06 NOTE — Progress Notes (Signed)
Office Visit Note   Patient: Andrew Stuart           Date of Birth: Sep 09, 1950           MRN: [id number] Visit Date: 05/06/2021              Requested by: Janith Lima, MD [address] [place],  [place] [address] PCP: Janith Lima, MD   Assessment & Plan: Visit Diagnoses:  1. 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  The patient is relatively asymptomatic.  We have discussed physical therapy in the future if needed.  He will let us know.  Follow-up with [place] as needed.  Follow-Up Instructions: Return if symptoms worsen or fail to improve.   Orders:  No orders of the defined types were placed in this encounter.  No orders of the defined types were placed in this encounter.     Procedures: No procedures performed   Clinical Data: No additional findings.   Subjective: Chief Complaint  Patient presents with   Right Leg - Pain   Left Leg - Pain    HPI patient is a pleasant 71 year old who comes in today to go over MRI results of his pelvis.  He was seen by his primary care provider this past fall for abdominal/prostate issues and subsequent MRI pelvis was obtained.  This MRI should incidental findings of bilateral proximal hamstring tendinosis specifically moderate to severe on the left.  The patient does note he has had slight tightness to the back of his legs at times but nothing significant.  He denies any buttock pain or any other trouble with activities due to this.  Review of Systems as detailed in HPI.  All others reviewed and are negative.   Objective: Vital Signs: There were no vitals taken for this visit.  Physical Exam well-developed well-nourished gentleman in no acute distress.  Alert and oriented x3.  Ortho Exam left lower extremity exam she has no pain with resisted knee flexion.  No tenderness to the hamstrings.  No focal  weakness.  He is neurovascular intact distally.  Specialty Comments:  No specialty comments available.  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[place]) 03/01/2021   Abnormal electrocardiogram (ECG) (EKG) 03/01/2021   Acute prostatitis without hematuria 03/01/2021   Vitamin B12 deficiency 02/18/2021   Chronic venous insufficiency 02/17/2021   Leg weakness, bilateral 02/17/2021   Hyperlipidemia with target LDL less than 130 08/26/2020   BPH without obstruction/lower urinary tract symptoms 08/26/2020   Need for shingles vaccine 08/26/2020   Carpal tunnel syndrome on left 04/28/2020   Past Medical History:  Diagnosis Date   Allergy    Arthritis    Hepatitis    Hyperlipidemia     Family History  Problem Relation Age of Onset   Arthritis Mother    Diabetes Mother    Early death Mother    Heart disease Mother    Hyperlipidemia Mother    Colon cancer Mother    Early death Father    Early death Sister    Colon cancer Sister    Early death Brother    Lung cancer Brother  ALS Sister     Past Surgical History:  Procedure Laterality Date   CARPAL TUNNEL RELEASE Right 2015   TONSILLECTOMY     Social History   Occupational History   Not on file  Tobacco Use   Smoking status: Never   Smokeless tobacco: Never  Vaping Use   Vaping Use: Never used  Substance and Sexual Activity   Alcohol use: Yes    Alcohol/week: 7.0 standard drinks    Types: 7 Glasses of wine per week   Drug use: Never   Sexual activity: Yes    Partners: Male    Birth control/protection: Condom

## 2021-05-16 ENCOUNTER — Ambulatory Visit (INDEPENDENT_AMBULATORY_CARE_PROVIDER_SITE_OTHER): Payer: Medicare HMO

## 2021-05-16 ENCOUNTER — Other Ambulatory Visit: Payer: Self-pay

## 2021-05-16 DIAGNOSIS — E538 Deficiency of other specified B group vitamins: Secondary | ICD-10-CM | POA: Diagnosis not present

## 2021-05-16 DIAGNOSIS — G320 Subacute combined degeneration of spinal cord in diseases classified elsewhere: Secondary | ICD-10-CM

## 2021-05-16 MED ORDER — CYANOCOBALAMIN 1000 MCG/ML IJ SOLN
1000.0000 ug | Freq: Once | INTRAMUSCULAR | Status: AC
  Administered 2021-05-16: 1000 ug via INTRAMUSCULAR

## 2021-05-16 NOTE — Progress Notes (Signed)
Pt was given B12 injection w/o any complications. 

## 2021-06-28 ENCOUNTER — Other Ambulatory Visit: Payer: Self-pay

## 2021-06-28 ENCOUNTER — Ambulatory Visit (INDEPENDENT_AMBULATORY_CARE_PROVIDER_SITE_OTHER): Payer: Medicare HMO | Admitting: Internal Medicine

## 2021-06-28 ENCOUNTER — Encounter: Payer: Self-pay | Admitting: Internal Medicine

## 2021-06-28 VITALS — BP 126/76 | HR 82 | Temp 98.1°F | Ht 69.0 in | Wt 172.0 lb

## 2021-06-28 DIAGNOSIS — E785 Hyperlipidemia, unspecified: Secondary | ICD-10-CM | POA: Diagnosis not present

## 2021-06-28 DIAGNOSIS — E538 Deficiency of other specified B group vitamins: Secondary | ICD-10-CM | POA: Diagnosis not present

## 2021-06-28 LAB — HEPATIC FUNCTION PANEL
ALT: 22 U/L (ref 0–53)
AST: 24 U/L (ref 0–37)
Albumin: 4.6 g/dL (ref 3.5–5.2)
Alkaline Phosphatase: 84 U/L (ref 39–117)
Bilirubin, Direct: 0.1 mg/dL (ref 0.0–0.3)
Total Bilirubin: 0.6 mg/dL (ref 0.2–1.2)
Total Protein: 7.2 g/dL (ref 6.0–8.3)

## 2021-06-28 LAB — LIPID PANEL
Cholesterol: 155 mg/dL (ref 0–200)
HDL: 57.1 mg/dL (ref >39.00)
LDL Cholesterol: 80 mg/dL (ref 0–99)
NonHDL: 97.79
Total CHOL/HDL Ratio: 3
Triglycerides: 88 mg/dL (ref 0.0–149.0)
VLDL: 17.6 mg/dL (ref 0.0–40.0)

## 2021-06-28 MED ORDER — CYANOCOBALAMIN 1000 MCG/ML IJ SOLN
1000.0000 ug | INTRAMUSCULAR | Status: DC
  Administered 2021-06-28 – 2022-01-04 (×3): 1000 ug via INTRAMUSCULAR

## 2021-06-28 NOTE — Progress Notes (Signed)
Subjective:  Patient ID: Andrew Stuart, male    DOB: 05/14/1950  Age: 71 y.o. MRN: [id number]  CC: Hyperlipidemia  This visit occurred during the SARS-CoV-2 public health emergency.  Safety protocols were in place, including screening questions prior to the visit, additional usage of staff PPE, and extensive cleaning of exam room while observing appropriate contact time as indicated for disinfecting solutions.    HPI Andrew Stuart presents for f/up -  His neuro symptoms are improving.  He is active and denies chest pain, shortness of breath, diaphoresis, or edema.  The pelvic pain has resolved.  Outpatient Medications Prior to Visit  Medication Sig Dispense Refill   aspirin EC 81 MG tablet Take 1 tablet (81 mg total) by mouth daily. 90 tablet 1   atorvastatin (LIPITOR) 20 MG tablet TAKE 1 TABLET BY MOUTH EVERY DAY 90 tablet 1   fluticasone (FLONASE) 50 MCG/ACT nasal spray Place into both nostrils daily.     Naproxen Sodium (ALEVE PO) Take by mouth.     No facility-administered medications prior to visit.    ROS Review of Systems  Constitutional:  Negative for diaphoresis and fatigue.  HENT: Negative.    Eyes: Negative.   Respiratory:  Negative for cough, chest tightness, shortness of breath and wheezing.   Cardiovascular:  Negative for chest pain, palpitations and leg swelling.  Gastrointestinal:  Negative for abdominal pain, constipation, diarrhea, nausea and vomiting.  Endocrine: Negative.   Genitourinary: Negative.  Negative for difficulty urinating.  Musculoskeletal:  Negative for arthralgias and myalgias.  Skin: Negative.  Negative for color change.  Neurological: Negative.  Negative for dizziness, weakness and light-headedness.  Hematological:  Negative for adenopathy. Does not bruise/bleed easily.  Psychiatric/Behavioral: Negative.     Objective:  BP 126/76 (BP Location: Left Arm, Patient Position: Sitting, Cuff Size: Large)    Pulse 82    Temp 98.1 F (36.7 C)  (Oral)    Ht 5\' 9"  (1.753 m)    Wt 172 lb (78 kg)    SpO2 98%    BMI 25.40 kg/m   BP Readings from Last 3 Encounters:  06/28/21 126/76  03/30/21 126/78  03/01/21 136/78    Wt Readings from Last 3 Encounters:  06/28/21 172 lb (78 kg)  03/30/21 165 lb (74.8 kg)  03/10/21 168 lb (76.2 kg)    Physical Exam Vitals reviewed.  HENT:     Nose: Nose normal.     Mouth/Throat:     Mouth: Mucous membranes are moist.  Eyes:     General: No scleral icterus.    Conjunctiva/sclera: Conjunctivae normal.  Cardiovascular:     Rate and Rhythm: Normal rate and regular rhythm.     Heart sounds: No murmur heard. Pulmonary:     Effort: Pulmonary effort is normal.     Breath sounds: No stridor. No wheezing, rhonchi or rales.  Abdominal:     General: Abdomen is flat.     Palpations: There is no mass.     Tenderness: There is no abdominal tenderness. There is no guarding.     Hernia: No hernia is present.  Musculoskeletal:        General: Normal range of motion.     Cervical back: Neck supple.     Right lower leg: No edema.     Left lower leg: No edema.  Lymphadenopathy:     Cervical: No cervical adenopathy.  Skin:    General: Skin is warm and dry.  Neurological:  Mental Status: He is alert. Mental status is at baseline.  Psychiatric:        Mood and Affect: Mood normal.        Behavior: Behavior normal.    Lab Results  Component Value Date   WBC 6.7 03/30/2021   HGB 14.7 03/30/2021   HCT 43.1 03/30/2021   PLT 251.0 03/30/2021   GLUCOSE 108 (H) 03/30/2021   CHOL 155 06/28/2021   TRIG 88.0 06/28/2021   HDL 57.10 06/28/2021   LDLCALC 80 06/28/2021   ALT 22 06/28/2021   AST 24 06/28/2021   NA 137 03/30/2021   K 4.2 03/30/2021   CL 101 03/30/2021   CREATININE 1.11 03/30/2021   BUN 15 03/30/2021   CO2 30 03/30/2021   TSH 3.22 08/26/2020   PSA 2.35 08/26/2020    MR Pelvis W Wo Contrast  Result Date: 04/20/2021 CLINICAL DATA:  Weight loss, anal pain EXAM: MRI PELVIS  WITHOUT AND WITH CONTRAST TECHNIQUE: Multiplanar multisequence MR imaging of the pelvis was performed both before and after administration of intravenous contrast. CONTRAST:  17mL MULTIHANCE GADOBENATE DIMEGLUMINE 529 MG/ML IV SOLN COMPARISON:  None. FINDINGS: Urinary Tract: Urinary bladder appears within normal limits. No suspicious mass identified. Bowel: No evidence of bowel obstruction. Mild colonic diverticulosis. No suspicious edema or enhancement in the visualized bowel, rectum or anal region. Vascular/Lymphatic: No aneurysms visualized. No bulky lymphadenopathy. Reproductive:  Prostate gland is enlarged. Other:  No ascites. Musculoskeletal: There is tendinosis of the bilateral proximal hamstring tendons which is moderate to severe on the left with partial tearing. No suspicious bony lesions identified. IMPRESSION: 1. No acute process or suspicious mass identified in the pelvis. 2. Prostatomegaly. 3. Mild colonic diverticulosis. 4. 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 Electronically Signed   By: Jannifer Hick M.D.   On: 04/20/2021 08:54    Assessment & Plan:   Andrew Stuart was seen today for hyperlipidemia.  Diagnoses and all orders for this visit:  Vitamin B12 deficiency -     cyanocobalamin ((VITAMIN B-12)) injection 1,000 mcg  Hyperlipidemia with target LDL less than 130- LDL goal achieved. Doing well on the statin  -     Lipid panel; Future -     Hepatic function panel; Future -     Hepatic function panel -     Lipid panel   I am having Andrew Stuart "Andrew Stuart" maintain his Naproxen Sodium (ALEVE PO), fluticasone, atorvastatin, and aspirin EC. We administered cyanocobalamin. We will continue to administer cyanocobalamin.  Meds ordered this encounter  Medications   cyanocobalamin ((VITAMIN B-12)) injection 1,000 mcg     Follow-up: Return in about 6 months (around 12/26/2021).  Sanda Linger, MD

## 2021-06-28 NOTE — Patient Instructions (Signed)

## 2021-07-28 ENCOUNTER — Ambulatory Visit (INDEPENDENT_AMBULATORY_CARE_PROVIDER_SITE_OTHER): Payer: Medicare HMO | Admitting: *Deleted

## 2021-07-28 DIAGNOSIS — E538 Deficiency of other specified B group vitamins: Secondary | ICD-10-CM

## 2021-07-28 MED ORDER — CYANOCOBALAMIN 1000 MCG/ML IJ SOLN
1000.0000 ug | Freq: Once | INTRAMUSCULAR | Status: AC
  Administered 2021-07-28: 1000 ug via INTRAMUSCULAR

## 2021-07-28 NOTE — Progress Notes (Signed)
Patient here for B12 injection. Given in left deltoid. Patient tolerated well  ? ?Please co sign  ?

## 2021-08-23 ENCOUNTER — Other Ambulatory Visit: Payer: Self-pay | Admitting: Internal Medicine

## 2021-08-23 DIAGNOSIS — E785 Hyperlipidemia, unspecified: Secondary | ICD-10-CM

## 2021-08-29 ENCOUNTER — Ambulatory Visit (INDEPENDENT_AMBULATORY_CARE_PROVIDER_SITE_OTHER): Payer: Medicare HMO

## 2021-08-29 DIAGNOSIS — E538 Deficiency of other specified B group vitamins: Secondary | ICD-10-CM

## 2021-08-29 MED ORDER — CYANOCOBALAMIN 1000 MCG/ML IJ SOLN
1000.0000 ug | Freq: Once | INTRAMUSCULAR | Status: AC
  Administered 2021-08-29: 1000 ug via INTRAMUSCULAR

## 2021-08-29 NOTE — Progress Notes (Signed)
Pt came into the office to receive his vitamin b12 injection. He tolerated the injection well. No questions or concerns.  ?

## 2021-09-29 ENCOUNTER — Encounter: Payer: Self-pay | Admitting: Internal Medicine

## 2021-09-29 ENCOUNTER — Ambulatory Visit (INDEPENDENT_AMBULATORY_CARE_PROVIDER_SITE_OTHER): Payer: Medicare HMO

## 2021-09-29 DIAGNOSIS — E538 Deficiency of other specified B group vitamins: Secondary | ICD-10-CM | POA: Diagnosis not present

## 2021-09-29 NOTE — Progress Notes (Signed)
After obtaining consent, and per orders of Dr. Jones, injection of B12 given by Moritz Lever. Patient tolerated injection well in right deltoid and was informed to report any adverse reaction to me immediately.  ?

## 2021-10-06 ENCOUNTER — Encounter: Payer: Self-pay | Admitting: Internal Medicine

## 2021-10-06 ENCOUNTER — Telehealth: Payer: Self-pay | Admitting: *Deleted

## 2021-10-06 ENCOUNTER — Ambulatory Visit (INDEPENDENT_AMBULATORY_CARE_PROVIDER_SITE_OTHER): Payer: Medicare HMO | Admitting: Internal Medicine

## 2021-10-06 VITALS — BP 124/68 | HR 74 | Temp 98.0°F | Ht 69.0 in | Wt 171.0 lb

## 2021-10-06 DIAGNOSIS — R739 Hyperglycemia, unspecified: Secondary | ICD-10-CM

## 2021-10-06 DIAGNOSIS — G603 Idiopathic progressive neuropathy: Secondary | ICD-10-CM

## 2021-10-06 DIAGNOSIS — I83813 Varicose veins of bilateral lower extremities with pain: Secondary | ICD-10-CM

## 2021-10-06 LAB — CBC WITH DIFFERENTIAL/PLATELET
Basophils Absolute: 0 10*3/uL (ref 0.0–0.1)
Basophils Relative: 0.4 % (ref 0.0–3.0)
Eosinophils Absolute: 0.1 10*3/uL (ref 0.0–0.7)
Eosinophils Relative: 1.6 % (ref 0.0–5.0)
HCT: 42 % (ref 39.0–52.0)
Hemoglobin: 14.4 g/dL (ref 13.0–17.0)
Lymphocytes Relative: 33.7 % (ref 12.0–46.0)
Lymphs Abs: 2.5 10*3/uL (ref 0.7–4.0)
MCHC: 34.2 g/dL (ref 30.0–36.0)
MCV: 87.5 fl (ref 78.0–100.0)
Monocytes Absolute: 0.6 10*3/uL (ref 0.1–1.0)
Monocytes Relative: 7.5 % (ref 3.0–12.0)
Neutro Abs: 4.3 10*3/uL (ref 1.4–7.7)
Neutrophils Relative %: 56.8 % (ref 43.0–77.0)
Platelets: 246 10*3/uL (ref 150.0–400.0)
RBC: 4.8 Mil/uL (ref 4.22–5.81)
RDW: 13.2 % (ref 11.5–15.5)
WBC: 7.5 10*3/uL (ref 4.0–10.5)

## 2021-10-06 LAB — HEPATIC FUNCTION PANEL
ALT: 23 U/L (ref 0–53)
AST: 23 U/L (ref 0–37)
Albumin: 4.5 g/dL (ref 3.5–5.2)
Alkaline Phosphatase: 78 U/L (ref 39–117)
Bilirubin, Direct: 0.1 mg/dL (ref 0.0–0.3)
Total Bilirubin: 0.6 mg/dL (ref 0.2–1.2)
Total Protein: 7.3 g/dL (ref 6.0–8.3)

## 2021-10-06 LAB — HEMOGLOBIN A1C: Hgb A1c MFr Bld: 5.9 % (ref 4.6–6.5)

## 2021-10-06 LAB — VITAMIN B12: Vitamin B-12: 1185 pg/mL — ABNORMAL HIGH (ref 211–911)

## 2021-10-06 LAB — BASIC METABOLIC PANEL
BUN: 15 mg/dL (ref 6–23)
CO2: 30 mEq/L (ref 19–32)
Calcium: 10 mg/dL (ref 8.4–10.5)
Chloride: 103 mEq/L (ref 96–112)
Creatinine, Ser: 0.91 mg/dL (ref 0.40–1.50)
GFR: 85.44 mL/min (ref >60.00)
Glucose, Bld: 104 mg/dL — ABNORMAL HIGH (ref 70–99)
Potassium: 4.5 mEq/L (ref 3.5–5.1)
Sodium: 141 mEq/L (ref 135–145)

## 2021-10-06 LAB — FOLATE: Folate: >24.2 ng/mL (ref >5.9)

## 2021-10-06 LAB — TSH: TSH: 2.03 u[IU]/mL (ref 0.35–5.50)

## 2021-10-06 MED ORDER — PREGABALIN 25 MG PO CAPS: 25.0000 mg | ORAL_CAPSULE | Freq: Three times a day (TID) | ORAL | 0 refills | Status: DC

## 2021-10-06 NOTE — Telephone Encounter (Signed)
Pt was on cover-my-meds needing PA on [place]. Submitted to cover-my-meds w/ (Key: [id number]) Rec'd msg " Your information has been submitted to [employer]. [employer] will review the request and will issue a decision, typically within 1-3 days from your submission...Raechel Chute

## 2021-10-06 NOTE — Progress Notes (Unsigned)
Subjective:  Patient ID: Andrew Stuart, male    DOB: 10/22/1950  Age: 71 y.o. MRN: [id number]  CC: No chief complaint on file.   HPI Andrew Stuart presents for f/up -  He complains of a 3 month hx of pain, swelling, N/T/burning in both feet.  Outpatient Medications Prior to Visit  Medication Sig Dispense Refill   aspirin EC 81 MG tablet Take 1 tablet (81 mg total) by mouth daily. 90 tablet 1   atorvastatin (LIPITOR) 20 MG tablet TAKE 1 TABLET BY MOUTH EVERY DAY 90 tablet 1   fluticasone (FLONASE) 50 MCG/ACT nasal spray Place into both nostrils daily.     Naproxen Sodium (ALEVE PO) Take by mouth.     Facility-Administered Medications Prior to Visit  Medication Dose Route Frequency Provider Last Rate Last Admin   cyanocobalamin ((VITAMIN B-12)) injection 1,000 mcg  1,000 mcg Intramuscular Q30 days Etta GrandchildJones, Marletta Bousquet L, MD   1,000 mcg at 09/29/21 1402    ROS Review of Systems  Objective:  BP 124/68 (BP Location: Left Arm, Patient Position: Sitting, Cuff Size: Large)   Pulse 74   Temp 98 F (36.7 C) (Oral)   Ht 5\' 9"  (1.753 m)   Wt 171 lb (77.6 kg)   SpO2 98%   BMI 25.25 kg/m   BP Readings from Last 3 Encounters:  10/06/21 124/68  06/28/21 126/76  03/30/21 126/78    Wt Readings from Last 3 Encounters:  10/06/21 171 lb (77.6 kg)  06/28/21 172 lb (78 kg)  03/30/21 165 lb (74.8 kg)    Physical Exam Vitals reviewed.  HENT:     Nose: Nose normal.     Mouth/Throat:     Mouth: Mucous membranes are moist.  Eyes:     General: No scleral icterus.    Conjunctiva/sclera: Conjunctivae normal.  Cardiovascular:     Rate and Rhythm: Normal rate.     Pulses:          Dorsalis pedis pulses are 1+ on the right side and 1+ on the left side.       Posterior tibial pulses are 1+ on the right side and 1+ on the left side.     Heart sounds: No murmur heard.    No gallop.  Pulmonary:     Effort: Pulmonary effort is normal.     Breath sounds: No stridor. No wheezing, rhonchi or  rales.  Abdominal:     Palpations: There is no mass.     Tenderness: There is no abdominal tenderness. There is no guarding.     Hernia: No hernia is present.  Musculoskeletal:        General: Normal range of motion.     Cervical back: Neck supple.     Right lower leg: No edema.     Left lower leg: No edema.     Comments: ++ multiple medium to large uncomplicated varicose veins BLE  Feet:     Right foot:     Skin integrity: Skin integrity normal.     Toenail Condition: Right toenails are normal.     Left foot:     Skin integrity: Skin integrity normal.     Toenail Condition: Left toenails are normal.  Lymphadenopathy:     Cervical: No cervical adenopathy.  Skin:    General: Skin is warm and dry.  Neurological:     Mental Status: He is alert. Mental status is at baseline.     Deep Tendon Reflexes: Reflexes normal.  Babinski sign absent on the right side. Babinski sign absent on the left side.     Reflex Scores:      Tricep reflexes are 1+ on the right side and 1+ on the left side.      Bicep reflexes are 1+ on the right side and 1+ on the left side.      Brachioradialis reflexes are 1+ on the right side and 1+ on the left side.      Patellar reflexes are 2+ on the right side and 2+ on the left side.      Achilles reflexes are 0 on the right side and 0 on the left side.    Lab Results  Component Value Date   WBC 6.7 03/30/2021   HGB 14.7 03/30/2021   HCT 43.1 03/30/2021   PLT 251.0 03/30/2021   GLUCOSE 108 (H) 03/30/2021   CHOL 155 06/28/2021   TRIG 88.0 06/28/2021   HDL 57.10 06/28/2021   LDLCALC 80 06/28/2021   ALT 22 06/28/2021   AST 24 06/28/2021   NA 137 03/30/2021   K 4.2 03/30/2021   CL 101 03/30/2021   CREATININE 1.11 03/30/2021   BUN 15 03/30/2021   CO2 30 03/30/2021   TSH 3.22 08/26/2020   PSA 2.35 08/26/2020    MR Pelvis W Wo Contrast  Result Date: 04/20/2021 CLINICAL DATA:  Weight loss, anal pain EXAM: MRI PELVIS WITHOUT AND WITH CONTRAST  TECHNIQUE: Multiplanar multisequence MR imaging of the pelvis was performed both before and after administration of intravenous contrast. CONTRAST:  41mL MULTIHANCE GADOBENATE DIMEGLUMINE 529 MG/ML IV SOLN COMPARISON:  None. FINDINGS: Urinary Tract: Urinary bladder appears within normal limits. No suspicious mass identified. Bowel: No evidence of bowel obstruction. Mild colonic diverticulosis. No suspicious edema or enhancement in the visualized bowel, rectum or anal region. Vascular/Lymphatic: No aneurysms visualized. No bulky lymphadenopathy. Reproductive:  Prostate gland is enlarged. Other:  No ascites. Musculoskeletal: There is tendinosis of the bilateral proximal hamstring tendons which is moderate to severe on the left with partial tearing. No suspicious bony lesions identified. IMPRESSION: 1. No acute process or suspicious mass identified in the pelvis. 2. Prostatomegaly. 3. Mild colonic diverticulosis. 4. 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 Electronically Signed   By: Jannifer Hick M.D.   On: 04/20/2021 08:54    Assessment & Plan:   Diagnoses and all orders for this visit:  Idiopathic progressive neuropathy -     Vitamin B12; Future -     CBC with Differential/Platelet; Future -     TSH; Future -     Vitamin B1; Future -     Folate; Future -     Protein electrophoresis, serum; Future -     Hepatic function panel; Future -     pregabalin (LYRICA) 25 MG capsule; Take 1 capsule (25 mg total) by mouth 3 (three) times daily.  Hyperglycemia -     Basic metabolic panel; Future -     Hemoglobin A1c; Future  Varicose veins of both lower extremities with pain -     Ambulatory referral to Vascular Surgery   I am having Andrew Stuart "[name]" start on pregabalin. I am also having him maintain his Naproxen Sodium (ALEVE PO), fluticasone, aspirin EC, and atorvastatin. We will continue to administer  cyanocobalamin.  Meds ordered this encounter  Medications   pregabalin (LYRICA) 25 MG capsule    Sig: Take 1 capsule (25  mg total) by mouth 3 (three) times daily.    Dispense:  270 capsule    Refill:  0     Follow-up: Return in about 3 months (around 01/06/2022).  Sanda Linger, MD

## 2021-10-06 NOTE — Patient Instructions (Signed)
Neuropathic Pain Neuropathic pain is pain caused by damage to the nerves that are responsible for certain sensations in your body (sensory nerves). Neuropathic pain can make you more sensitive to pain. Even a minor sensation can feel very painful. This is usually a long-term (chronic) condition that can be difficult to treat. The type of pain differs from person to person. It may: Start suddenly (acute), or it may develop slowly and become chronic. Come and go as damaged nerves heal, or it may stay at the same level for years. Cause emotional distress, loss of sleep, and a lower quality of life. What are the causes? The most common cause of this condition is diabetes. Many other diseases and conditions can also cause neuropathic pain. Causes of neuropathic pain can be classified as: Toxic. This is caused by medicines and chemicals. The most common causes of toxic neuropathic pain is damage from medicines that kill cancer cells (chemotherapy) or alcohol abuse. Metabolic. This can be caused by: Diabetes. Lack of vitamins like B12. Traumatic. Any injury that cuts, crushes, or stretches a nerve can cause damage and pain. Compression-related. If a sensory nerve gets trapped or compressed for a long period of time, the blood supply to the nerve can be cut off. Vascular. Many blood vessel diseases can cause neuropathic pain by decreasing blood supply and oxygen to nerves. Autoimmune. This type of pain results from diseases in which the body's defense system (immune system) mistakenly attacks sensory nerves. Examples of autoimmune diseases that can cause neuropathic pain include lupus and multiple sclerosis. Infectious. Many types of viral infections can damage sensory nerves and cause pain. Shingles infection is a common cause of this type of pain. Inherited. Neuropathic pain can be a symptom of many diseases that are passed down through families (genetic). What increases the risk? You are more likely to  develop this condition if: You have diabetes. You smoke. You drink too much alcohol. You are taking certain medicines, including chemotherapy or medicines that treat immune system disorders. What are the signs or symptoms? The main symptom is pain. Neuropathic pain is often described as: Burning. Shock-like. Stinging. Hot or cold. Itching. How is this diagnosed? No single test can diagnose neuropathic pain. It is diagnosed based on: A physical exam and your symptoms. Your health care provider will ask you about your pain. You may be asked to use a pain scale to describe how bad your pain is. Tests. These may be done to see if you have a cause and location of any nerve damage. They include: Nerve conduction studies and electromyography to test how well nerve signals travel through your nerves and muscles (electrodiagnostic testing). Skin biopsy to evaluate for small fiber neuropathy. Imaging studies, such as: X-rays. CT scan. MRI. How is this treated? Treatment for neuropathic pain may change over time. You may need to try different treatment options or a combination of treatments. Some options include: Treating the underlying cause of the neuropathy, such as diabetes, kidney disease, or vitamin deficiencies. Stopping medicines that can cause neuropathy, such as chemotherapy. Medicine to relieve pain. Medicines may include: Prescription or over-the-counter pain medicine. Anti-seizure medicine. Antidepressant medicines. Pain-relieving patches or creams that are applied to painful areas of skin. A medicine to numb the area (local anesthetic), which can be injected as a nerve block. Transcutaneous nerve stimulation. This uses electrical currents to block painful nerve signals. The treatment is painless. Alternative treatments, such as: Acupuncture. Meditation. Massage. Occupational or physical therapy. Pain management programs. Counseling. Follow   these instructions at  home: Medicines  Take over-the-counter and prescription medicines only as told by your health care provider. Ask your health care provider if the medicine prescribed to you: Requires you to avoid driving or using machinery. Can cause constipation. You may need to take these actions to prevent or treat constipation: Drink enough fluid to keep your urine pale yellow. Take over-the-counter or prescription medicines. Eat foods that are high in fiber, such as beans, whole grains, and fresh fruits and vegetables. Limit foods that are high in fat and processed sugars, such as fried or sweet foods. Lifestyle  Have a good support system at home. Consider joining a chronic pain support group. Do not use any products that contain nicotine or tobacco. These products include cigarettes, chewing tobacco, and vaping devices, such as e-cigarettes. If you need help quitting, ask your health care provider. Do not drink alcohol. General instructions Learn as much as you can about your condition. Work closely with all your health care providers to find the treatment plan that works best for you. Ask your health care provider what activities are safe for you. Keep all follow-up visits. This is important. Contact a health care provider if: Your pain treatments are not working. You are having side effects from your medicines. You are struggling with tiredness (fatigue), mood changes, depression, or anxiety. Get help right away if: You have thoughts of hurting yourself. Get help right away if you feel like you may hurt yourself or others, or have thoughts about taking your own life. Go to your nearest emergency room or: Call 911. Call the National Suicide Prevention Lifeline at 1-800-273-8255 or 988. This is open 24 hours a day. Text the Crisis Text Line at 741741. Summary Neuropathic pain is pain caused by damage to the nerves that are responsible for certain sensations in your body (sensory  nerves). Neuropathic pain may come and go as damaged nerves heal, or it may stay at the same level for years. Neuropathic pain is usually a long-term condition that can be difficult to treat. Consider joining a chronic pain support group. This information is not intended to replace advice given to you by your health care provider. Make sure you discuss any questions you have with your health care provider. Document Revised: 12/13/2020 Document Reviewed: 12/13/2020 Elsevier Patient Education  2023 Elsevier Inc.  

## 2021-10-07 NOTE — Telephone Encounter (Signed)
Rec'd determination back med was APPROVED. It states This approval authorizes your coverage from 05/01/2021 - 04/30/2022, unless we notify you otherwise, and as long as the following conditions apply: Faxed approval to pof../l,mb

## 2021-10-11 ENCOUNTER — Encounter: Payer: Self-pay | Admitting: Internal Medicine

## 2021-10-11 LAB — PROTEIN ELECTROPHORESIS, SERUM
Albumin ELP: 4.3 g/dL (ref 3.8–4.8)
Alpha 1: 0.3 g/dL (ref 0.2–0.3)
Alpha 2: 0.8 g/dL (ref 0.5–0.9)
Beta 2: 0.3 g/dL (ref 0.2–0.5)
Beta Globulin: 0.4 g/dL (ref 0.4–0.6)
Gamma Globulin: 1 g/dL (ref 0.8–1.7)
Total Protein: 7.1 g/dL (ref 6.1–8.1)

## 2021-10-11 LAB — VITAMIN B1: Vitamin B1 (Thiamine): 12 nmol/L (ref 8–30)

## 2021-10-22 ENCOUNTER — Other Ambulatory Visit: Payer: Self-pay

## 2021-10-22 DIAGNOSIS — I8393 Asymptomatic varicose veins of bilateral lower extremities: Secondary | ICD-10-CM

## 2021-10-31 ENCOUNTER — Ambulatory Visit (INDEPENDENT_AMBULATORY_CARE_PROVIDER_SITE_OTHER): Payer: Medicare HMO | Admitting: *Deleted

## 2021-10-31 DIAGNOSIS — E538 Deficiency of other specified B group vitamins: Secondary | ICD-10-CM

## 2021-10-31 MED ORDER — CYANOCOBALAMIN 1000 MCG/ML IJ SOLN
1000.0000 ug | Freq: Once | INTRAMUSCULAR | Status: AC
  Administered 2021-10-31: 1000 ug via INTRAMUSCULAR

## 2021-10-31 NOTE — Progress Notes (Signed)
Patient here for B12 injections . Given in left deltoid. Patient tolerated well

## 2021-11-14 NOTE — Progress Notes (Signed)
Requested by:  [name], MD [address] [place],  [place] [address]  Reason for consultation: varicose veins BLE    History of Present Illness   Andrew Stuart is a 71 y.o. (05/25/1950) male who presents for evaluation of ankle and foot swelling for about 1 year. This all began after he found out he had significant B12 deficiency. He since has been on B12 replacement therapy. He however has had continued issues with swelling in both of his legs, left> right. The swelling is improved upon first waking and with elevation. He has worn some OTC compression stockings and tight high ankle socks which do help some with the swelling. He also reports a warmth/ burning sensation in his feet as well as tightness around ankles and explains that his feet "feel like I am walking on bubble wrap". The burning sensation has been improved with his B12 therapy and also lyrica. He explains that the tightness in his ankles and swelling is usually most evident on walking. It does seem to improve with continued ambulation, but he has been hesitant to really push it too far. He says he use to walk 3 miles per day. Currently only walking up to 1.5 miles. He otherwise is very active and hopes to increase his walking now that he is feeling better. He was previous Chartered certified accountant and also worked for [employer] for many years so stood for many hours a day. No hx of DVT. He does have family history of venous disease in his mother. He has PAD family history as well.   Venous symptoms include: heavy, tired, burning, swelling Onset/duration:  > 1 year  Occupation:  retired Aggravating factors: ambulating, standing Alleviating factors: elevation, compression Compression:  yes Helps:  yes Pain medications:  none Previous vein procedures:  none History of DVT:  none  Past Medical History:  Diagnosis Date   Allergy    Arthritis    Hepatitis    Hyperlipidemia     Past Surgical History:  Procedure Laterality Date   CARPAL  TUNNEL RELEASE Right 2015   TONSILLECTOMY      Social History   Socioeconomic History   Marital status: Married    Spouse name: Not on file   Number of children: Not on file   Years of education: Not on file   Highest education level: Not on file  Occupational History   Not on file  Tobacco Use   Smoking status: Never    Passive exposure: Never   Smokeless tobacco: Never  Vaping Use   Vaping Use: Never used  Substance and Sexual Activity   Alcohol use: Yes    Alcohol/week: 7.0 standard drinks of alcohol    Types: 7 Glasses of wine per week   Drug use: Never   Sexual activity: Yes    Partners: Male    Birth control/protection: Condom  Other Topics Concern   Not on file  Social History Narrative   Not on file   Social Determinants of Health   Financial Resource Strain: Low Risk  (01/09/2021)   Overall Financial Resource Strain (CARDIA)    Difficulty of Paying Living Expenses: Not hard at all  Food Insecurity: No Food Insecurity (01/09/2021)   Hunger Vital Sign    Worried About Running Out of Food in the Last Year: Never true    Ran Out of Food in the Last Year: Never true  Transportation Needs: No Transportation Needs (01/09/2021)   PRAPARE - Transportation  Lack of Transportation (Medical): No    Lack of Transportation (Non-Medical): No  Physical Activity: Insufficiently Active (01/09/2021)   Exercise Vital Sign    Days of Exercise per Week: 3 days    Minutes of Exercise per Session: 30 min  Stress: No Stress Concern Present (01/09/2021)   [employer] of Occupational Health - Occupational Stress Questionnaire    Feeling of Stress : Not at all  Social Connections: Moderately Isolated (01/09/2021)   Social Connection and Isolation Panel [NHANES]    Frequency of Communication with Friends and Family: More than three times a week    Frequency of Social Gatherings with Friends and Family: Once a week    Attends Religious Services: Never    Database administrator  or Organizations: No    Attends Banker Meetings: Never    Marital Status: Married  Catering manager Violence: Not At Risk (01/09/2021)   Humiliation, Afraid, Rape, and Kick questionnaire    Fear of Current or Ex-Partner: No    Emotionally Abused: No    Physically Abused: No    Sexually Abused: No    Family History  Problem Relation Age of Onset   Arthritis Mother    Diabetes Mother    Early death Mother    Heart disease Mother    Hyperlipidemia Mother    Colon cancer Mother    Early death Father    Early death Sister    Colon cancer Sister    Early death Brother    Lung cancer Brother    ALS Sister     Current Outpatient Medications  Medication Sig Dispense Refill   atorvastatin (LIPITOR) 20 MG tablet TAKE 1 TABLET BY MOUTH EVERY DAY 90 tablet 1   fluticasone (FLONASE) 50 MCG/ACT nasal spray Place into both nostrils daily.     Naproxen Sodium (ALEVE PO) Take by mouth.     pregabalin (LYRICA) 25 MG capsule Take 1 capsule (25 mg total) by mouth 3 (three) times daily. 270 capsule 0   aspirin EC 81 MG tablet Take 1 tablet (81 mg total) by mouth daily. (Patient not taking: Reported on 11/17/2021) 90 tablet 1   Current Facility-Administered Medications  Medication Dose Route Frequency Provider Last Rate Last Admin   cyanocobalamin ((VITAMIN B-12)) injection 1,000 mcg  1,000 mcg Intramuscular Q30 days [name], MD   1,000 mcg at 09/29/21 1402    Allergies  Allergen Reactions   Bactrim [Sulfamethoxazole-Trimethoprim] Rash   Bee Pollen    Blueberry Flavor    Strawberry Extract     REVIEW OF SYSTEMS (negative unless checked):   Cardiac:  []  Chest pain or chest pressure? []  Shortness of breath upon activity? []  Shortness of breath when lying flat? []  Irregular heart rhythm?  Vascular:  []  Pain in calf, thigh, or hip brought on by walking? []  Pain in feet at night that wakes you up from your sleep? []  Blood clot in your veins? [x]  Leg  swelling?  Pulmonary:  []  Oxygen at home? []  Productive cough? []  Wheezing?  Neurologic:  []  Sudden weakness in arms or legs? []  Sudden numbness in arms or legs? []  Sudden onset of difficult speaking or slurred speech? []  Temporary loss of vision in one eye? []  Problems with dizziness?  Gastrointestinal:  []  Blood in stool? []  Vomited blood?  Genitourinary:  []  Burning when urinating? []  Blood in urine?  Psychiatric:  []  Major depression  Hematologic:  []  Bleeding problems? []  Problems with blood clotting?  Dermatologic:  []  Rashes or ulcers?  Constitutional:  []  Fever or chills?  Ear/Nose/Throat:  []  Change in hearing? []  Nose bleeds? []  Sore throat?  Musculoskeletal:  []  Back pain? []  Joint pain? []  Muscle pain?   Physical Examination     Vitals:   11/17/21 1332  BP: 122/72  Pulse: 69  Resp: 20  Temp: 98 F (36.7 C)  TempSrc: Temporal  SpO2: 96%  Weight: 172 lb 12.8 oz (78.4 kg)  Height: 5\' 9"  (1.753 m)   Body mass index is 25.52 kg/m.  General:  WDWN in NAD; vital signs documented above Gait: Normal HENT: WNL, normocephalic Pulmonary: normal non-labored breathing , without wheezing Cardiac: regular HR Vascular Exam/Pulses: legs well perfused and warm with 2+ DP pulses Extremities: with varicose veins left leg, with reticular veins both lower legs, with edema, without stasis pigmentation, without lipodermatosclerosis, without ulcers Musculoskeletal: no muscle wasting or atrophy  Neurologic: A&O X 3;  No focal weakness or paresthesias are detected Psychiatric:  The pt has Normal affect.  Non-invasive Vascular Imaging   BLE Venous Insufficiency Duplex (11/17/21):   LLE: No DVT, SVT in the SSV- chronic GSV reflux SFJ to proximal calf  GSV diameter 0.348 -0.47 No SSV reflux  No deep venous reflux   Medical Decision Making   WILBUR WESSELMANN is a 70 y.o. male who presents with: LLE chronic venous insufficiency, with varicose veins  with complications. Duplex today shows no DVT. 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 GSV has reflux from SFJ to proximal calf. His GSV is of adequate size to be considered for ablation.  Based on the patient's history and examination, I recommend: elevation of his legs, compression stockings, exercise, and refraining from prolonged sitting or standing I discussed with the patient the use of her 20-30 mm thigh high compression stockings and need for 3 month trial of such. The patient will follow up in 3 months with MD to further discuss venous ablation  Thank you for allowing [place] to participate in this patient's care.   Karoline Caldwell, PA-C [facility] Office: [phone]  11/17/2021, 2:16 PM  Clinic MD: Scot Dock

## 2021-11-17 ENCOUNTER — Ambulatory Visit (INDEPENDENT_AMBULATORY_CARE_PROVIDER_SITE_OTHER): Payer: Medicare HMO | Admitting: Physician Assistant

## 2021-11-17 ENCOUNTER — Ambulatory Visit (HOSPITAL_COMMUNITY)
Admission: RE | Admit: 2021-11-17 | Discharge: 2021-11-17 | Disposition: A | Discharge status: Home / Self Care | Payer: Medicare HMO | Source: Ambulatory Visit | Attending: Vascular Surgery | Admitting: Vascular Surgery

## 2021-11-17 VITALS — BP 122/72 | HR 69 | Temp 98.0°F | Resp 20 | Ht 69.0 in | Wt 172.8 lb

## 2021-11-17 DIAGNOSIS — I872 Venous insufficiency (chronic) (peripheral): Secondary | ICD-10-CM

## 2021-11-17 DIAGNOSIS — I8393 Asymptomatic varicose veins of bilateral lower extremities: Secondary | ICD-10-CM

## 2021-12-01 ENCOUNTER — Ambulatory Visit (INDEPENDENT_AMBULATORY_CARE_PROVIDER_SITE_OTHER): Payer: Medicare HMO

## 2021-12-01 DIAGNOSIS — E538 Deficiency of other specified B group vitamins: Secondary | ICD-10-CM

## 2021-12-01 MED ORDER — CYANOCOBALAMIN 1000 MCG/ML IJ SOLN
1000.0000 ug | Freq: Once | INTRAMUSCULAR | Status: AC
  Administered 2021-12-01: 1000 ug via INTRAMUSCULAR

## 2021-12-01 NOTE — Progress Notes (Signed)
After obtaining consent, and per orders of Dr. Yetta Barre, injection of B12 given on left deltoid by Ferdie Ping. Patient instructed to remain in clinic for 20 minutes afterwards, and to report any adverse reaction to me immediately.

## 2021-12-21 ENCOUNTER — Telehealth: Payer: Self-pay | Admitting: Internal Medicine

## 2021-12-21 NOTE — Telephone Encounter (Signed)
LVM for pt to rtn my call to schedule AWV with [facility] call back # [phone] 

## 2022-01-04 ENCOUNTER — Ambulatory Visit (INDEPENDENT_AMBULATORY_CARE_PROVIDER_SITE_OTHER): Payer: Medicare HMO | Admitting: Internal Medicine

## 2022-01-04 ENCOUNTER — Encounter: Payer: Self-pay | Admitting: Internal Medicine

## 2022-01-04 VITALS — BP 128/70 | HR 71 | Temp 98.1°F | Ht 69.0 in | Wt 171.0 lb

## 2022-01-04 DIAGNOSIS — Z0001 Encounter for general adult medical examination with abnormal findings: Secondary | ICD-10-CM | POA: Insufficient documentation

## 2022-01-04 DIAGNOSIS — Z0000 Encounter for general adult medical examination without abnormal findings: Secondary | ICD-10-CM | POA: Diagnosis not present

## 2022-01-04 DIAGNOSIS — E785 Hyperlipidemia, unspecified: Secondary | ICD-10-CM | POA: Diagnosis not present

## 2022-01-04 DIAGNOSIS — E538 Deficiency of other specified B group vitamins: Secondary | ICD-10-CM | POA: Diagnosis not present

## 2022-01-04 DIAGNOSIS — N400 Enlarged prostate without lower urinary tract symptoms: Secondary | ICD-10-CM | POA: Diagnosis not present

## 2022-01-04 LAB — PSA: PSA: 2.16 ng/mL (ref 0.10–4.00)

## 2022-01-04 MED ORDER — CYANOCOBALAMIN 1000 MCG/ML IJ SOLN: 1000.0000 ug | Freq: Once | INTRAMUSCULAR | Status: DC

## 2022-01-04 MED ORDER — ATORVASTATIN CALCIUM 20 MG PO TABS: 20.0000 mg | ORAL_TABLET | Freq: Every day | ORAL | 1 refills | Status: DC

## 2022-01-04 NOTE — Progress Notes (Signed)
Subjective:  Patient ID: Andrew Stuart, male    DOB: Sep 24, 1950  Age: 71 y.o. MRN: [id number]  CC: Annual Exam   HPI Andrew Stuart presents for a CPX and f/up -  He exercises on the treadmill and does not experience chest pain, shortness of breath, diaphoresis, or edema.  He has been seeing orthopedics about low back pain.  He tells me his neuropathy symptoms are not painful and is getting no benefit from Lyrica so he would like to stop taking it.  Outpatient Medications Prior to Visit  Medication Sig Dispense Refill   aspirin EC 81 MG tablet Take 1 tablet (81 mg total) by mouth daily. 90 tablet 1   fluticasone (FLONASE) 50 MCG/ACT nasal spray Place into both nostrils daily.     Naproxen Sodium (ALEVE PO) Take by mouth.     atorvastatin (LIPITOR) 20 MG tablet TAKE 1 TABLET BY MOUTH EVERY DAY 90 tablet 1   pregabalin (LYRICA) 25 MG capsule Take 1 capsule (25 mg total) by mouth 3 (three) times daily. 270 capsule 0   cyanocobalamin ((VITAMIN B-12)) injection 1,000 mcg      No facility-administered medications prior to visit.    ROS Review of Systems  Constitutional:  Negative for chills, diaphoresis, fatigue and fever.  HENT: Negative.    Eyes: Negative.   Respiratory:  Negative for cough, chest tightness, shortness of breath and wheezing.   Cardiovascular:  Negative for chest pain, palpitations and leg swelling.  Gastrointestinal:  Negative for abdominal pain, constipation, diarrhea and nausea.  Endocrine: Negative.   Genitourinary: Negative.  Negative for difficulty urinating.  Musculoskeletal:  Positive for back pain. Negative for arthralgias and myalgias.  Skin: Negative.   Neurological: Negative.  Negative for dizziness and weakness.  Hematological:  Negative for adenopathy. Does not bruise/bleed easily.  Psychiatric/Behavioral: Negative.      Objective:  BP 128/70 (BP Location: Left Arm, Patient Position: Sitting, Cuff Size: Large)   Pulse 71   Temp 98.1 F (36.7 C)  (Oral)   Ht 5\' 9"  (1.753 m)   Wt 171 lb (77.6 kg)   SpO2 98%   BMI 25.25 kg/m   BP Readings from Last 3 Encounters:  01/04/22 128/70  11/17/21 122/72  10/06/21 124/68    Wt Readings from Last 3 Encounters:  01/04/22 171 lb (77.6 kg)  11/17/21 172 lb 12.8 oz (78.4 kg)  10/06/21 171 lb (77.6 kg)    Physical Exam Vitals reviewed.  HENT:     Nose: Nose normal.     Mouth/Throat:     Mouth: Mucous membranes are moist.  Eyes:     General: No scleral icterus.    Conjunctiva/sclera: Conjunctivae normal.  Cardiovascular:     Rate and Rhythm: Normal rate and regular rhythm.     Heart sounds: No murmur heard. Pulmonary:     Effort: Pulmonary effort is normal.     Breath sounds: No stridor. No wheezing, rhonchi or rales.  Abdominal:     General: Abdomen is flat.     Palpations: There is no mass.     Tenderness: There is no abdominal tenderness. There is no guarding.     Hernia: No hernia is present.  Musculoskeletal:        General: Normal range of motion.     Cervical back: Neck supple.     Right lower leg: No edema.     Left lower leg: No edema.  Lymphadenopathy:     Cervical: No cervical  adenopathy.  Skin:    General: Skin is warm and dry.  Neurological:     General: No focal deficit present.  Psychiatric:        Mood and Affect: Mood normal.        Behavior: Behavior normal.     Lab Results  Component Value Date   WBC 7.5 10/06/2021   HGB 14.4 10/06/2021   HCT 42.0 10/06/2021   PLT 246.0 10/06/2021   GLUCOSE 104 (H) 10/06/2021   CHOL 155 06/28/2021   TRIG 88.0 06/28/2021   HDL 57.10 06/28/2021   LDLCALC 80 06/28/2021   ALT 23 10/06/2021   AST 23 10/06/2021   NA 141 10/06/2021   K 4.5 10/06/2021   CL 103 10/06/2021   CREATININE 0.91 10/06/2021   BUN 15 10/06/2021   CO2 30 10/06/2021   TSH 2.03 10/06/2021   PSA 2.16 01/04/2022   HGBA1C 5.9 10/06/2021    VAS [place] LOWER EXTREMITY VENOUS REFLUX  Result Date: 11/17/2021  Lower Venous Reflux Study  Patient Name:  Andrew Stuart  Date of Exam:   11/17/2021 Medical Rec #: [id number]       Accession #:    [id number] Date of Birth: [birth year]        Patient Gender: M Patient Age:   77 years Exam Location:  [facility] Procedure:      VAS [place] LOWER EXTREMITY VENOUS REFLUX Referring Phys: Harold Barban --------------------------------------------------------------------------------  Indications: Varicosities, and venous insufficiency.  Performing Technologist: Ralene Cork RVT  Examination Guidelines: A complete evaluation includes B-mode imaging, spectral Doppler, color Doppler, and power Doppler as needed of all accessible portions of each vessel. Bilateral testing is considered an integral part of a complete examination. Limited examinations for reoccurring indications may be performed as noted. The reflux portion of the exam is performed with the patient in reverse Trendelenburg. Significant venous reflux is defined as >500 ms in the superficial venous system, and >1 second in the deep venous system.  +--------------+---------+------+-----------+------------+----------------+ LEFT          Reflux NoRefluxReflux TimeDiameter cmsComments                                 Yes                                          +--------------+---------+------+-----------+------------+----------------+ CFV           no                                                     +--------------+---------+------+-----------+------------+----------------+ FV prox       no                                                     +--------------+---------+------+-----------+------------+----------------+ FV mid        no                                                     +--------------+---------+------+-----------+------------+----------------+  FV dist       no                                                     +--------------+---------+------+-----------+------------+----------------+  Popliteal     no                                                     +--------------+---------+------+-----------+------------+----------------+ GSV at SFJ              yes    >500 ms     0.568                     +--------------+---------+------+-----------+------------+----------------+ GSV prox thigh          yes    >500 ms      0.43                     +--------------+---------+------+-----------+------------+----------------+ GSV mid thigh           yes    >500 ms      0.43                     +--------------+---------+------+-----------+------------+----------------+ GSV dist thigh          yes    >500 ms      0.47                     +--------------+---------+------+-----------+------------+----------------+ GSV at knee             yes    >500 ms      0.47                     +--------------+---------+------+-----------+------------+----------------+ GSV prox calf           yes    >500 ms     0.348                     +--------------+---------+------+-----------+------------+----------------+ SSV Pop Fossa                              0.147                     +--------------+---------+------+-----------+------------+----------------+ SSV prox calf                                       chronic thrombus +--------------+---------+------+-----------+------------+----------------+ SSV mid calf                                        chronic thrombus +--------------+---------+------+-----------+------------+----------------+   Summary: Left: - Color duplex evaluation of the left lower extremity shows there is thrombus in the lesser saphenous vein. 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 - Venous reflux is noted in the left sapheno-femoral junction. - Venous reflux is noted in the left greater saphenous vein in the thigh. - Venous reflux is noted in  the left greater saphenous vein in the calf.  *See table(s) above for measurements and observations.  Electronically signed by Waverly Ferrari MD on 11/17/2021 at 1:58:39 PM.    Final     Assessment & Plan:   Avantae was seen today for annual exam.  Diagnoses and all orders for this visit:  BPH without obstruction/lower urinary tract symptoms- He has no symptoms and his PSA is normal. -     PSA; Future -     PSA  Vitamin B12 deficiency -     Discontinue: cyanocobalamin (VITAMIN B12) injection 1,000 mcg  Hyperlipidemia with target LDL less than 130- LDL goal achieved. Doing well on the statin  -     atorvastatin (LIPITOR) 20 MG tablet; Take 1 tablet (20 mg total) by mouth daily.  Encounter for general adult medical examination with abnormal findings- Exam completed, labs reviewed, vaccines are UTD, cancer screenings are up-to-date, patient education was given.   I have discontinued Andrew Oak. Seelbach "Jack"'s pregabalin. I have also changed his atorvastatin. Additionally, I am having him maintain his Naproxen Sodium (ALEVE PO), fluticasone, and aspirin EC. We will stop administering cyanocobalamin. Additionally, we administered cyanocobalamin.  Meds ordered this encounter  Medications   atorvastatin (LIPITOR) 20 MG tablet    Sig: Take 1 tablet (20 mg total) by mouth daily.    Dispense:  90 tablet    Refill:  1   DISCONTD: cyanocobalamin (VITAMIN B12) injection 1,000 mcg     Follow-up: Return in about 6 months (around 07/05/2022).  Sanda Linger, MD

## 2022-01-04 NOTE — Patient Instructions (Signed)
Health Maintenance, Male Adopting a healthy lifestyle and getting preventive care are important in promoting health and wellness. Ask your health care provider about: The right schedule for you to have regular tests and exams. Things you can do on your own to prevent diseases and keep yourself healthy. What should I know about diet, weight, and exercise? Eat a healthy diet  Eat a diet that includes plenty of vegetables, fruits, low-fat dairy products, and lean protein. Do not eat a lot of foods that are high in solid fats, added sugars, or sodium. Maintain a healthy weight Body mass index (BMI) is a measurement that can be used to identify possible weight problems. It estimates body fat based on height and weight. Your health care provider can help determine your BMI and help you achieve or maintain a healthy weight. Get regular exercise Get regular exercise. This is one of the most important things you can do for your health. Most adults should: Exercise for at least 150 minutes each week. The exercise should increase your heart rate and make you sweat (moderate-intensity exercise). Do strengthening exercises at least twice a week. This is in addition to the moderate-intensity exercise. Spend less time sitting. Even light physical activity can be beneficial. Watch cholesterol and blood lipids Have your blood tested for lipids and cholesterol at 70 years of age, then have this test every 5 years. You may need to have your cholesterol levels checked more often if: Your lipid or cholesterol levels are high. You are older than 70 years of age. You are at high risk for heart disease. What should I know about cancer screening? Many types of cancers can be detected early and may often be prevented. Depending on your health history and family history, you may need to have cancer screening at various ages. This may include screening for: Colorectal cancer. Prostate cancer. Skin cancer. Lung  cancer. What should I know about heart disease, diabetes, and high blood pressure? Blood pressure and heart disease High blood pressure causes heart disease and increases the risk of stroke. This is more likely to develop in people who have high blood pressure readings or are overweight. Talk with your health care provider about your target blood pressure readings. Have your blood pressure checked: Every 3-5 years if you are 18-39 years of age. Every year if you are 40 years old or older. If you are between the ages of 65 and 75 and are a current or former smoker, ask your health care provider if you should have a one-time screening for abdominal aortic aneurysm (AAA). Diabetes Have regular diabetes screenings. This checks your fasting blood sugar level. Have the screening done: Once every three years after age 45 if you are at a normal weight and have a low risk for diabetes. More often and at a younger age if you are overweight or have a high risk for diabetes. What should I know about preventing infection? Hepatitis B If you have a higher risk for hepatitis B, you should be screened for this virus. Talk with your health care provider to find out if you are at risk for hepatitis B infection. Hepatitis C Blood testing is recommended for: Everyone born from 1945 through 1965. Anyone with known risk factors for hepatitis C. Sexually transmitted infections (STIs) You should be screened each year for STIs, including gonorrhea and chlamydia, if: You are sexually active and are younger than 70 years of age. You are older than 70 years of age and your   health care provider tells you that you are at risk for this type of infection. Your sexual activity has changed since you were last screened, and you are at increased risk for chlamydia or gonorrhea. Ask your health care provider if you are at risk. Ask your health care provider about whether you are at high risk for HIV. Your health care provider  may recommend a prescription medicine to help prevent HIV infection. If you choose to take medicine to prevent HIV, you should first get tested for HIV. You should then be tested every 3 months for as long as you are taking the medicine. Follow these instructions at home: Alcohol use Do not drink alcohol if your health care provider tells you not to drink. If you drink alcohol: Limit how much you have to 0-2 drinks a day. Know how much alcohol is in your drink. In the [place] one drink equals one 12 oz bottle of beer (355 mL), one 5 oz glass of wine (148 mL), or one 1 oz glass of hard liquor (44 mL). Lifestyle Do not use any products that contain nicotine or tobacco. These products include cigarettes, chewing tobacco, and vaping devices, such as e-cigarettes. If you need help quitting, ask your health care provider. Do not use street drugs. Do not share needles. Ask your health care provider for help if you need support or information about quitting drugs. General instructions Schedule regular health, dental, and eye exams. Stay current with your vaccines. Tell your health care provider if: You often feel depressed. You have ever been abused or do not feel safe at home. Summary Adopting a healthy lifestyle and getting preventive care are important in promoting health and wellness. Follow your health care provider's instructions about healthy diet, exercising, and getting tested or screened for diseases. Follow your health care provider's instructions on monitoring your cholesterol and blood pressure. This information is not intended to replace advice given to you by your health care provider. Make sure you discuss any questions you have with your health care provider. Document Revised: 09/06/2020 Document Reviewed: 09/06/2020 [employer] Patient Education  2023 [employer]  

## 2022-01-16 ENCOUNTER — Ambulatory Visit (INDEPENDENT_AMBULATORY_CARE_PROVIDER_SITE_OTHER): Payer: Medicare HMO

## 2022-01-16 VITALS — Ht 69.0 in | Wt 170.0 lb

## 2022-01-16 DIAGNOSIS — Z0000 Encounter for general adult medical examination without abnormal findings: Secondary | ICD-10-CM

## 2022-01-16 NOTE — Progress Notes (Signed)
Subjective:   Andrew Stuart is a 70 y.o. male who presents for Medicare Annual/Subsequent preventive examination.   Virtual Visit via Telephone Note  I connected with  Andrew Stuart on 01/16/22 at  8:45 AM EDT by telephone and verified that I am speaking with the correct person using two identifiers.  Location: Patient: Home  Provider: [facility]  Persons participating in the virtual visit: patient/Nurse Health Advisor   I discussed the limitations, risks, security and privacy concerns of performing an evaluation and management service by telephone and the availability of in person appointments. The patient expressed understanding and agreed to proceed.  Interactive audio and video telecommunications were attempted between this nurse and patient, however failed, due to patient having technical difficulties OR patient did not have access to video capability.  We continued and completed visit with audio only.  Some vital signs may be absent or patient reported.   Daphane Shepherd, LPN  Review of Systems     Cardiac Risk Factors include: advanced age (>82men, >47 women);male gender;dyslipidemia     Objective:    Today's Vitals   01/16/22 0844  Weight: 170 lb (77.1 kg)  Height: 5\' 9"  (1.753 m)   Body mass index is 25.1 kg/m.     01/16/2022    8:48 AM 01/09/2021   10:40 AM  Advanced Directives  Does Patient Have a Medical Advance Directive? Yes No;Yes  Type of Paramedic of [place];Living will [place];Living will  Copy of [place] in Chart? No - copy requested No - copy requested  Would patient like information on creating a medical advance directive?  No - Patient declined    Current Medications (verified) Outpatient Encounter Medications as of 01/16/2022  Medication Sig   aspirin EC 81 MG tablet Take 1 tablet (81 mg total) by mouth daily.   atorvastatin (LIPITOR) 20 MG tablet Take 1 tablet (20 mg total)  by mouth daily.   fluticasone (FLONASE) 50 MCG/ACT nasal spray Place into both nostrils daily.   Naproxen Sodium (ALEVE PO) Take by mouth.   No facility-administered encounter medications on file as of 01/16/2022.    Allergies (verified) Bactrim [sulfamethoxazole-trimethoprim], Azithromycin, Bee pollen, Blueberry flavor, and Strawberry extract   History: Past Medical History:  Diagnosis Date   Allergy    Arthritis    Hepatitis    Hyperlipidemia    Past Surgical History:  Procedure Laterality Date   CARPAL TUNNEL RELEASE Right 2015   TONSILLECTOMY     Family History  Problem Relation Age of Onset   Arthritis Mother    Diabetes Mother    Early death Mother    Heart disease Mother    Hyperlipidemia Mother    Colon cancer Mother    Early death Father    Early death Sister    Colon cancer Sister    Early death Brother    Lung cancer Brother    ALS Sister    Social History   Socioeconomic History   Marital status: Married    Spouse name: Not on file   Number of children: Not on file   Years of education: Not on file   Highest education level: Not on file  Occupational History   Not on file  Tobacco Use   Smoking status: Never    Passive exposure: Never   Smokeless tobacco: Never  Vaping Use   Vaping Use: Never used  Substance and Sexual Activity   Alcohol  use: Yes    Alcohol/week: 7.0 standard drinks of alcohol    Types: 7 Glasses of wine per week   Drug use: Never   Sexual activity: Yes    Partners: Male    Birth control/protection: Condom  Other Topics Concern   Not on file  Social History Narrative   Not on file   Social Determinants of Health   Financial Resource Strain: Low Risk  (01/16/2022)   Overall Financial Resource Strain (CARDIA)    Difficulty of Paying Living Expenses: Not hard at all  Food Insecurity: No Food Insecurity (01/16/2022)   Hunger Vital Sign    Worried About Running Out of Food in the Last Year: Never true    Ran Out of Food  in the Last Year: Never true  Transportation Needs: No Transportation Needs (01/16/2022)   PRAPARE - Administrator, Civil ServiceTransportation    Lack of Transportation (Medical): No    Lack of Transportation (Non-Medical): No  Physical Activity: Sufficiently Active (01/16/2022)   Exercise Vital Sign    Days of Exercise per Week: 6 days    Minutes of Exercise per Session: 60 min  Stress: No Stress Concern Present (01/16/2022)   [employer] - Occupational Stress Questionnaire    Feeling of Stress : Not at all  Social Connections: Moderately Isolated (01/16/2022)   Social Connection and Isolation Panel [NHANES]    Frequency of Communication with Friends and Family: More than three times a week    Frequency of Social Gatherings with Friends and Family: More than three times a week    Attends Religious Services: Never    Database administratorActive Member of Clubs or Organizations: No    Attends Engineer, structuralClub or Organization Meetings: Never    Marital Status: Married    Tobacco Counseling Counseling given: Not Answered   Clinical Intake:  Pre-visit preparation completed: Yes  Pain : No/denies pain     Nutritional Risks: None Diabetes: No  How often do you need to have someone help you when you read instructions, pamphlets, or other written materials from your doctor or pharmacy?: 1 - Never  Diabetic?no   Interpreter Needed?: No  Information entered by :: Renie OraLaura Wilson, LPN   Activities of Daily Living    01/16/2022    8:48 AM  In your present state of health, do you have any difficulty performing the following activities:  Hearing? 0  Vision? 0  Difficulty concentrating or making decisions? 0  Walking or climbing stairs? 0  Dressing or bathing? 0  Doing errands, shopping? 0  Preparing Food and eating ? N  Using the Toilet? N  In the past six months, have you accidently leaked urine? N  Do you have problems with loss of bowel control? N  Managing your Medications? N  Managing your Finances? N   Housekeeping or managing your Housekeeping? N    Patient Care Team: Etta GrandchildJones, Thomas L, MD as PCP - General (Internal Medicine)  Indicate any recent Medical Services you may have received from other than [facility] providers in the past year (date may be approximate).     Assessment:   This is a routine wellness examination for Andrew PihJackie.  Hearing/Vision screen Hearing Screening - Comments:: Annual eye exams war glasses   Dietary issues and exercise activities discussed: Current Exercise Habits: Home exercise routine, Type of exercise: walking;strength training/weights, Time (Minutes): 60, Frequency (Times/Week): 6, Weekly Exercise (Minutes/Week): 360, Intensity: Mild, Exercise limited by: None identified   Goals Addressed  This Visit's Progress    DIET - INCREASE WATER INTAKE   On track      Depression Screen    01/16/2022    8:46 AM 10/06/2021   11:32 AM 08/26/2020    9:54 AM 04/28/2020    8:34 AM  PHQ 2/9 Scores  PHQ - 2 Score 0 0 0 0    Fall Risk    01/16/2022    8:45 AM 01/09/2021   10:32 AM 08/26/2020    9:55 AM  Fall Risk   Falls in the past year? 0 0 0  Number falls in past yr: 0 0 0  Injury with Fall? 0 0 0  Risk for fall due to : No Fall Risks    Follow up Falls prevention discussed      FALL RISK PREVENTION PERTAINING TO THE HOME:  Any stairs in or around the home? Yes  If so, are there any without handrails? No  Home free of loose throw rugs in walkways, pet beds, electrical cords, etc? Yes  Adequate lighting in your home to reduce risk of falls? Yes   ASSISTIVE DEVICES UTILIZED TO PREVENT FALLS:  Life alert? No  Use of a cane, walker or w/c? No  Grab bars in the bathroom? No  Shower chair or bench in shower? No  Elevated toilet seat or a handicapped toilet? No        01/16/2022    8:49 AM 01/09/2021   10:39 AM  6CIT Screen  What Year? 0 points 0 points  What month? 0 points 0 points  What time? 0 points 0 points  Count back from 20 0  points 0 points  Months in reverse 0 points 0 points  Repeat phrase 0 points 2 points  Total Score 0 points 2 points    Immunizations Immunization History  Administered Date(s) Administered   Fluad Quad(high Dose 65+) 01/12/2022   Influenza-Unspecified 12/31/2019   PFIZER(Purple Top)SARS-COV-2 Vaccination 02/27/2020, 08/02/2020   PNEUMOCOCCAL CONJUGATE-20 08/26/2020   Pfizer Covid-19 Vaccine Bivalent Booster 15yrs & up 08/31/2021   Pneumococcal Polysaccharide-23 05/01/2018   Tdap 06/24/2021   Zoster Recombinat (Shingrix) 06/03/2021, 08/08/2021    TDAP status: Up to date  Flu Vaccine status: Due, Education has been provided regarding the importance of this vaccine. Advised may receive this vaccine at local pharmacy or Health Dept. Aware to provide a copy of the vaccination record if obtained from local pharmacy or Health Dept. Verbalized acceptance and understanding.  Pneumococcal vaccine status: Up to date  Covid-19 vaccine status: Completed vaccines  Qualifies for Shingles Vaccine? Yes   Zostavax completed No   Shingrix Completed?: No.    Education has been provided regarding the importance of this vaccine. Patient has been advised to call insurance company to determine out of pocket expense if they have not yet received this vaccine. Advised may also receive vaccine at local pharmacy or Health Dept. Verbalized acceptance and understanding.  Screening Tests Health Maintenance  Topic Date Due   Hepatitis C Screening  Never done   COVID-19 Vaccine (4 - Pfizer series) 01/20/2022 (Originally 01/01/2022)   COLONOSCOPY (Pts 45-40yrs Insurance coverage will need to be confirmed)  04/13/2025   TETANUS/TDAP  06/25/2031   Pneumonia Vaccine 49+ Years old  Completed   INFLUENZA VACCINE  Completed   Zoster Vaccines- Shingrix  Completed   HPV VACCINES  Aged Out    Health Maintenance  Health Maintenance Due  Topic Date Due   Hepatitis C Screening  Never done  Colorectal cancer  screening: Type of screening: Colonoscopy. Completed 04/14/2015. Repeat every 10 years  Lung Cancer Screening: (Low Dose CT Chest recommended if Age 30-80 years, 30 pack-year currently smoking OR have quit w/in 15years.) does not qualify.   Lung Cancer Screening Referral: n/a  Additional Screening:  Hepatitis C Screening: does not qualify  Vision Screening: Recommended annual ophthalmology exams for early detection of glaucoma and other disorders of the eye. Is the patient up to date with their annual eye exam?  Yes  Who is the provider or what is the name of the office in which the patient attends annual eye exams? Patient to schedule  If pt is not established with a provider, would they like to be referred to a provider to establish care? No .   Dental Screening: Recommended annual dental exams for proper oral hygiene  Community Resource Referral / Chronic Care Management: CRR required this visit?  No   CCM required this visit?  No      Plan:     I have personally reviewed and noted the following in the patient's chart:   Medical and social history Use of alcohol, tobacco or illicit drugs  Current medications and supplements including opioid prescriptions. Patient is not currently taking opioid prescriptions. Functional ability and status Nutritional status Physical activity Advanced directives List of other physicians Hospitalizations, surgeries, and ER visits in previous 12 months Vitals Screenings to include cognitive, depression, and falls Referrals and appointments  In addition, I have reviewed and discussed with patient certain preventive protocols, quality metrics, and best practice recommendations. A written personalized care plan for preventive services as well as general preventive health recommendations were provided to patient.     Lorrene Reid, LPN   2/29/7989   Nurse Notes:

## 2022-01-16 NOTE — Patient Instructions (Signed)
Andrew Stuart , Thank you for taking time to come for your Medicare Wellness Visit. I appreciate your ongoing commitment to your health goals. Please review the following plan we discussed and let me know if I can assist you in the future.   Screening recommendations/referrals: Colonoscopy: 12/141/2016  due 2026 Recommended yearly ophthalmology/optometry visit for glaucoma screening and checkup Recommended yearly dental visit for hygiene and checkup  Vaccinations: Influenza vaccine: Completed  Pneumococcal vaccine: completed  Tdap vaccine: 06/24/2021 Shingles vaccine: completed    Covid-19: completed   Advanced directives: Please bring a copy of your health care power of attorney and living will to the office to be added to your chart at your convenience.   Conditions/risks identified: Aim for 30 minutes of exercise or brisk walking, 6-8 glasses of water, and 5 servings of fruits and vegetables each day.   Next appointment: Follow up in one year for your annual wellness visit.   Preventive Care 35 Years and Older, Male  Preventive care refers to lifestyle choices and visits with your health care provider that can promote health and wellness. What does preventive care include? A yearly physical exam. This is also called an annual well check. Dental exams once or twice a year. Routine eye exams. Ask your health care provider how often you should have your eyes checked. Personal lifestyle choices, including: Daily care of your teeth and gums. Regular physical activity. Eating a healthy diet. Avoiding tobacco and drug use. Limiting alcohol use. Practicing safe sex. Taking low doses of aspirin every day. Taking vitamin and mineral supplements as recommended by your health care provider. What happens during an annual well check? The services and screenings done by your health care provider during your annual well check will depend on your age, overall health, lifestyle risk factors, and  family history of disease. Counseling  Your health care provider may ask you questions about your: Alcohol use. Tobacco use. Drug use. Emotional well-being. Home and relationship well-being. Sexual activity. Eating habits. History of falls. Memory and ability to understand (cognition). Work and work Statistician. Screening  You may have the following tests or measurements: Height, weight, and BMI. Blood pressure. Lipid and cholesterol levels. These may be checked every 5 years, or more frequently if you are over 26 years old. Skin check. Lung cancer screening. You may have this screening every year starting at age 69 if you have a 30-pack-year history of smoking and currently smoke or have quit within the past 15 years. Fecal occult blood test (FOBT) of the stool. You may have this test every year starting at age 36. Flexible sigmoidoscopy or colonoscopy. You may have a sigmoidoscopy every 5 years or a colonoscopy every 10 years starting at age 11. Prostate cancer screening. Recommendations will vary depending on your family history and other risks. Hepatitis C blood test. Hepatitis B blood test. Sexually transmitted disease (STD) testing. Diabetes screening. This is done by checking your blood sugar (glucose) after you have not eaten for a while (fasting). You may have this done every 1-3 years. Abdominal aortic aneurysm (AAA) screening. You may need this if you are a current or former smoker. Osteoporosis. You may be screened starting at age 43 if you are at high risk. Talk with your health care provider about your test results, treatment options, and if necessary, the need for more tests. Vaccines  Your health care provider may recommend certain vaccines, such as: Influenza vaccine. This is recommended every year. Tetanus, diphtheria, and acellular pertussis (  Tdap, Td) vaccine. You may need a Td booster every 10 years. Zoster vaccine. You may need this after age 30. Pneumococcal  13-valent conjugate (PCV13) vaccine. One dose is recommended after age 9. Pneumococcal polysaccharide (PPSV23) vaccine. One dose is recommended after age 32. Talk to your health care provider about which screenings and vaccines you need and how often you need them. This information is not intended to replace advice given to you by your health care provider. Make sure you discuss any questions you have with your health care provider. Document Released: 05/14/2015 Document Revised: 01/05/2016 Document Reviewed: 02/16/2015 Elsevier Interactive Patient Education  2017 [place] Prevention in the Home Falls can cause injuries. They can happen to people of all ages. There are many things you can do to make your home safe and to help prevent falls. What can I do on the outside of my home? Regularly fix the edges of walkways and driveways and fix any cracks. Remove anything that might make you trip as you walk through a door, such as a raised step or threshold. Trim any bushes or trees on the path to your home. Use bright outdoor lighting. Clear any walking paths of anything that might make someone trip, such as rocks or tools. Regularly check to see if handrails are loose or broken. Make sure that both sides of any steps have handrails. Any raised decks and porches should have guardrails on the edges. Have any leaves, snow, or ice cleared regularly. Use sand or salt on walking paths during winter. Clean up any spills in your garage right away. This includes oil or grease spills. What can I do in the bathroom? Use night lights. Install grab bars by the toilet and in the tub and shower. Do not use towel bars as grab bars. Use non-skid mats or decals in the tub or shower. If you need to sit down in the shower, use a plastic, non-slip stool. Keep the floor dry. Clean up any water that spills on the floor as soon as it happens. Remove soap buildup in the tub or shower regularly. Attach  bath mats securely with double-sided non-slip rug tape. Do not have throw rugs and other things on the floor that can make you trip. What can I do in the bedroom? Use night lights. Make sure that you have a light by your bed that is easy to reach. Do not use any sheets or blankets that are too big for your bed. They should not hang down onto the floor. Have a firm chair that has side arms. You can use this for support while you get dressed. Do not have throw rugs and other things on the floor that can make you trip. What can I do in the kitchen? Clean up any spills right away. Avoid walking on wet floors. Keep items that you use a lot in easy-to-reach places. If you need to reach something above you, use a strong step stool that has a grab bar. Keep electrical cords out of the way. Do not use floor polish or wax that makes floors slippery. If you must use wax, use non-skid floor wax. Do not have throw rugs and other things on the floor that can make you trip. What can I do with my stairs? Do not leave any items on the stairs. Make sure that there are handrails on both sides of the stairs and use them. Fix handrails that are broken or loose. Make sure that handrails are  as long as the stairways. Check any carpeting to make sure that it is firmly attached to the stairs. Fix any carpet that is loose or worn. Avoid having throw rugs at the top or bottom of the stairs. If you do have throw rugs, attach them to the floor with carpet tape. Make sure that you have a light switch at the top of the stairs and the bottom of the stairs. If you do not have them, ask someone to add them for you. What else can I do to help prevent falls? Wear shoes that: Do not have high heels. Have rubber bottoms. Are comfortable and fit you well. Are closed at the toe. Do not wear sandals. If you use a stepladder: Make sure that it is fully opened. Do not climb a closed stepladder. Make sure that both sides of the  stepladder are locked into place. Ask someone to hold it for you, if possible. Clearly mark and make sure that you can see: Any grab bars or handrails. First and last steps. Where the edge of each step is. Use tools that help you move around (mobility aids) if they are needed. These include: Canes. Walkers. Scooters. Crutches. Turn on the lights when you go into a dark area. Replace any light bulbs as soon as they burn out. Set up your furniture so you have a clear path. Avoid moving your furniture around. If any of your floors are uneven, fix them. If there are any pets around you, be aware of where they are. Review your medicines with your doctor. Some medicines can make you feel dizzy. This can increase your chance of falling. Ask your doctor what other things that you can do to help prevent falls. This information is not intended to replace advice given to you by your health care provider. Make sure you discuss any questions you have with your health care provider. Document Released: 02/11/2009 Document Revised: 09/23/2015 Document Reviewed: 05/22/2014 Elsevier Interactive Patient Education  2017 [employer].

## 2022-02-03 ENCOUNTER — Ambulatory Visit (INDEPENDENT_AMBULATORY_CARE_PROVIDER_SITE_OTHER): Payer: Medicare HMO

## 2022-02-03 DIAGNOSIS — E538 Deficiency of other specified B group vitamins: Secondary | ICD-10-CM

## 2022-02-03 MED ORDER — CYANOCOBALAMIN 1000 MCG/ML IJ SOLN
1000.0000 ug | Freq: Once | INTRAMUSCULAR | Status: AC
  Administered 2022-02-03: 1000 ug via INTRAMUSCULAR

## 2022-02-03 NOTE — Progress Notes (Signed)
Pt here for monthly B12 injection per Dr. Jones  B12 1000mcg given IM, and pt tolerated injection well.  Next B12 injection scheduled for 03/06/22 

## 2022-02-22 ENCOUNTER — Encounter: Payer: Self-pay | Admitting: Vascular Surgery

## 2022-02-22 ENCOUNTER — Ambulatory Visit (INDEPENDENT_AMBULATORY_CARE_PROVIDER_SITE_OTHER): Payer: Medicare HMO | Admitting: Vascular Surgery

## 2022-02-22 VITALS — BP 144/74 | HR 73 | Temp 98.2°F | Resp 18 | Ht 69.0 in | Wt 174.6 lb

## 2022-02-22 DIAGNOSIS — I872 Venous insufficiency (chronic) (peripheral): Secondary | ICD-10-CM

## 2022-02-22 DIAGNOSIS — M7989 Other specified soft tissue disorders: Secondary | ICD-10-CM

## 2022-02-22 NOTE — Progress Notes (Addendum)
REASON FOR VISIT:   Follow-up of chronic venous insufficiency  MEDICAL ISSUES:   CHRONIC VENOUS INSUFFICIENCY: This patient has CEAP C4 venous disease.  He has hyperpigmentation and also some corona phlebectatica.  He had formal venous reflux testing on the left in July which showed superficial venous reflux but no deep venous reflux.  He has not had formal venous reflux testing on the right.  However based on my exam with the SonoSite given that the great saphenous vein was not dilated I suspect he may have some deep venous reflux on the right side.  We have discussed the importance of daily leg elevation and the proper positioning for this.  He will continue to wear his thigh-high compression stockings with a gradient of 20 to 30 mmHg.  I encouraged him to avoid prolonged sitting and standing.  We have discussed the importance of exercise specifically walking and water aerobics.  If the symptoms on the left progress then I think he would be a candidate for laser ablation of the left great saphenous vein with a few stabs and potentially 1 to 2 units of sclerotherapy.  If his symptoms progress on the right side I think he needs formal venous reflux testing on the right.  He is also been having some back issues so wants to get this worked up first.  He will call once that work-up is complete.   HPI:   Andrew Stuart is a pleasant 71 y.o. male who was seen by Karoline Caldwell, [place] on 11/17/2021.  She had venous insufficiency and varicose veins.  Her duplex scan showed some chronic thrombus in the small saphenous vein.  The great saphenous vein had reflux from the saphenofemoral junction to the proximal calf.  She was encouraged to elevate her legs, she was fitted for thigh-high compression stockings with a gradient of 20 to 30 mmHg, she was encouraged to exercise.  She comes in for 23-month follow-up visit.  On my history, the patient describes swelling in both legs which is more significant on the  right side.  He also describes some aching pain in the legs which is brought on by standing and relieved somewhat with elevation.  He also describes stiffness in both legs.  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  He has no previous history of DVT.  Past Medical History:  Diagnosis Date   Allergy    Arthritis    Hepatitis    Hyperlipidemia     Family History  Problem Relation Age of Onset   Arthritis Mother    Diabetes Mother    Early death Mother    Heart disease Mother    Hyperlipidemia Mother    Colon cancer Mother    Early death Father    Early death Sister    Colon cancer Sister    Early death Brother    Lung cancer Brother    ALS Sister     SOCIAL HISTORY: Social History   Tobacco Use   Smoking status: Never    Passive exposure: Never   Smokeless tobacco: Never  Substance Use Topics   Alcohol use: Yes    Alcohol/week: 7.0 standard drinks of alcohol    Types: 7 Glasses of wine per week    Allergies  Allergen Reactions   Bactrim [Sulfamethoxazole-Trimethoprim] Rash   Azithromycin    Bee Pollen    Blueberry Flavor    Strawberry Extract     Current Outpatient Medications  Medication Sig Dispense Refill   aspirin EC 81 MG tablet Take 1 tablet (81 mg total) by mouth daily. 90 tablet 1   atorvastatin (LIPITOR) 20 MG tablet Take 1 tablet (20 mg total) by mouth daily. 90 tablet 1   fluticasone (FLONASE) 50 MCG/ACT nasal spray Place into both nostrils daily.     Naproxen Sodium (ALEVE PO) Take by mouth.     No current facility-administered medications for this visit.    REVIEW OF SYSTEMS:  [X]  denotes positive finding, [ ]  denotes negative finding Cardiac  Comments:  Chest pain or chest pressure:    Shortness of breath upon exertion:    Short of breath when lying flat:    Irregular heart rhythm:        Vascular    Pain in calf, thigh, or hip brought on by ambulation:    Pain in feet at night that wakes you up from your sleep:     Blood  clot in your veins:    Leg swelling:         Pulmonary    Oxygen at home:    Productive cough:     Wheezing:         Neurologic    Sudden weakness in arms or legs:     Sudden numbness in arms or legs:     Sudden onset of difficulty speaking or slurred speech:    Temporary loss of vision in one eye:     Problems with dizziness:         Gastrointestinal    Blood in stool:     Vomited blood:         Genitourinary    Burning when urinating:     Blood in urine:        Psychiatric    Major depression:         Hematologic    Bleeding problems:    Problems with blood clotting too easily:        Skin    Rashes or ulcers:        Constitutional    Fever or chills:     PHYSICAL EXAM:   Vitals:   02/22/22 1304  BP: (!) 144/74  Pulse: 73  Resp: 18  Temp: 98.2 F (36.8 C)  TempSrc: Temporal  SpO2: 98%  Weight: 174 lb 9.6 oz (79.2 kg)  Height: 5\' 9"  (1.753 m)    GENERAL: The patient is a well-nourished male, in no acute distress. The vital signs are documented above. CARDIAC: There is a regular rate and rhythm.  VASCULAR: I do not detect carotid bruits. He has palpable dorsalis pedis pulses bilaterally. He does have hyperpigmentation bilaterally and some corona phlebectatica   I did look at the right great saphenous vein myself with the SonoSite.  He does have reflux down to the knee and the vein is dilated. I looked at the left great saphenous vein also the vein here was not especially dilated. PULMONARY: There is good air exchange bilaterally without wheezing or rales. ABDOMEN: Soft and non-tender with normal pitched bowel sounds.  MUSCULOSKELETAL: There are no major deformities or cyanosis. NEUROLOGIC: No focal weakness or paresthesias are detected. SKIN: There are no ulcers or rashes noted. PSYCHIATRIC: The patient has a normal affect.  DATA:    VENOUS DUPLEX: I have reviewed the venous duplex scan from 11/17/2021.  This was of the left lower extremity  only.  There was no evidence of DVT.  There was no  deep venous reflux.  There was superficial venous reflux in the left great saphenous vein from the saphenofemoral junction to the proximal calf.  Diameters of the vein ranged from 4.3-4.7 mm.  There was some chronic thrombus in the left small saphenous vein.     Waverly Ferrari [facility] [phone]

## 2022-03-06 ENCOUNTER — Ambulatory Visit (INDEPENDENT_AMBULATORY_CARE_PROVIDER_SITE_OTHER): Payer: Medicare HMO

## 2022-03-06 DIAGNOSIS — E538 Deficiency of other specified B group vitamins: Secondary | ICD-10-CM | POA: Diagnosis not present

## 2022-03-06 MED ORDER — CYANOCOBALAMIN 1000 MCG/ML IJ SOLN
1000.0000 ug | Freq: Once | INTRAMUSCULAR | Status: AC
  Administered 2022-03-06: 1000 ug via INTRAMUSCULAR

## 2022-03-06 NOTE — Progress Notes (Signed)
Pt here for monthly B12 injection per   B12 1000mcg given IM, and pt tolerated injection well.   

## 2022-03-09 ENCOUNTER — Ambulatory Visit (INDEPENDENT_AMBULATORY_CARE_PROVIDER_SITE_OTHER): Payer: Medicare HMO | Admitting: Internal Medicine

## 2022-03-09 ENCOUNTER — Ambulatory Visit (INDEPENDENT_AMBULATORY_CARE_PROVIDER_SITE_OTHER): Payer: Medicare HMO

## 2022-03-09 VITALS — BP 136/82 | HR 66 | Temp 97.8°F | Ht 69.0 in | Wt 173.0 lb

## 2022-03-09 DIAGNOSIS — G8929 Other chronic pain: Secondary | ICD-10-CM | POA: Diagnosis not present

## 2022-03-09 DIAGNOSIS — M79642 Pain in left hand: Secondary | ICD-10-CM

## 2022-03-09 DIAGNOSIS — I10 Essential (primary) hypertension: Secondary | ICD-10-CM

## 2022-03-09 NOTE — Progress Notes (Unsigned)
Subjective:  Patient ID: Andrew Stuart, male    DOB: Jul 31, 1950  Age: 71 y.o. MRN: [id number]  CC: Osteoarthritis and Back Pain   HPI Andrew Stuart presents for f/up -  He is an active walker and denies chest pain.  He only experiences dyspnea on exertion when he is on a steep incline.  He complains of a burning sensation in both hips and chronic low back pain.  He recently saw orthopedics.  He complains of a several month history of left hand pain with no injury or swelling.  He wants to have an x-ray done.  He is controlling the pain with Tylenol.  Outpatient Medications Prior to Visit  Medication Sig Dispense Refill   aspirin EC 81 MG tablet Take 1 tablet (81 mg total) by mouth daily. 90 tablet 1   atorvastatin (LIPITOR) 20 MG tablet Take 1 tablet (20 mg total) by mouth daily. 90 tablet 1   fluticasone (FLONASE) 50 MCG/ACT nasal spray Place into both nostrils daily.     Naproxen Sodium (ALEVE PO) Take by mouth.     No facility-administered medications prior to visit.    ROS Review of Systems  Constitutional: Negative.  Negative for chills, diaphoresis, fatigue and fever.  HENT: Negative.    Respiratory:  Positive for shortness of breath. Negative for chest tightness and wheezing.   Cardiovascular:  Negative for chest pain, palpitations and leg swelling.  Gastrointestinal:  Negative for abdominal pain, diarrhea, nausea and vomiting.  Genitourinary: Negative.  Negative for difficulty urinating.  Musculoskeletal:  Positive for arthralgias and back pain. Negative for joint swelling and myalgias.  Skin: Negative.   Neurological:  Positive for weakness. Negative for dizziness, light-headedness and numbness.  Hematological:  Negative for adenopathy. Does not bruise/bleed easily.  Psychiatric/Behavioral: Negative.      Objective:  BP 136/82 (BP Location: Left Arm, Patient Position: Sitting, Cuff Size: Large)   Pulse 66   Temp 97.8 F (36.6 C) (Oral)   Ht 5\' 9"  (1.753 m)   Wt  173 lb (78.5 kg)   SpO2 98%   BMI 25.55 kg/m   BP Readings from Last 3 Encounters:  03/09/22 136/82  02/22/22 (!) 144/74  01/04/22 128/70    Wt Readings from Last 3 Encounters:  03/09/22 173 lb (78.5 kg)  02/22/22 174 lb 9.6 oz (79.2 kg)  01/16/22 170 lb (77.1 kg)    Physical Exam Vitals reviewed.  Constitutional:      Appearance: He is not ill-appearing.  HENT:     Nose: Nose normal.     Mouth/Throat:     Mouth: Mucous membranes are moist.  Eyes:     General: No scleral icterus.    Conjunctiva/sclera: Conjunctivae normal.  Cardiovascular:     Rate and Rhythm: Normal rate.     Heart sounds: No murmur heard.    No gallop.  Pulmonary:     Effort: Pulmonary effort is normal.     Breath sounds: No stridor. No wheezing, rhonchi or rales.  Abdominal:     General: Abdomen is flat.     Palpations: There is no mass.     Tenderness: There is no abdominal tenderness. There is no guarding.     Hernia: No hernia is present.  Musculoskeletal:        General: Deformity (djd) present. No swelling.     Cervical back: Neck supple.     Right lower leg: No edema.     Left lower leg: No edema.  Lymphadenopathy:     Cervical: No cervical adenopathy.  Skin:    General: Skin is warm and dry.  Neurological:     General: No focal deficit present.     Mental Status: He is alert. Mental status is at baseline.  Psychiatric:        Mood and Affect: Mood normal.        Behavior: Behavior normal.     Lab Results  Component Value Date   WBC 7.5 10/06/2021   HGB 14.4 10/06/2021   HCT 42.0 10/06/2021   PLT 246.0 10/06/2021   GLUCOSE 104 (H) 10/06/2021   CHOL 155 06/28/2021   TRIG 88.0 06/28/2021   HDL 57.10 06/28/2021   LDLCALC 80 06/28/2021   ALT 23 10/06/2021   AST 23 10/06/2021   NA 141 10/06/2021   K 4.5 10/06/2021   CL 103 10/06/2021   CREATININE 0.91 10/06/2021   BUN 15 10/06/2021   CO2 30 10/06/2021   TSH 2.03 10/06/2021   PSA 2.16 01/04/2022   HGBA1C 5.9 10/06/2021     VAS [place] LOWER EXTREMITY VENOUS REFLUX  Result Date: 11/17/2021  Lower Venous Reflux Study Patient Name:  Andrew Stuart  Date of Exam:   11/17/2021 Medical Rec #: [id number]       Accession #:    [id number] Date of Birth: [birth year]        Patient Gender: M Patient Age:   64 years Exam Location:  [facility] Procedure:      VAS [place] LOWER EXTREMITY VENOUS REFLUX Referring Phys: Coral Else --------------------------------------------------------------------------------  Indications: Varicosities, and venous insufficiency.  Performing Technologist: Thereasa Parkin RVT  Examination Guidelines: A complete evaluation includes B-mode imaging, spectral Doppler, color Doppler, and power Doppler as needed of all accessible portions of each vessel. Bilateral testing is considered an integral part of a complete examination. Limited examinations for reoccurring indications may be performed as noted. The reflux portion of the exam is performed with the patient in reverse Trendelenburg. Significant venous reflux is defined as >500 ms in the superficial venous system, and >1 second in the deep venous system.  +--------------+---------+------+-----------+------------+----------------+ LEFT          Reflux NoRefluxReflux TimeDiameter cmsComments                                 Yes                                          +--------------+---------+------+-----------+------------+----------------+ CFV           no                                                     +--------------+---------+------+-----------+------------+----------------+ FV prox       no                                                     +--------------+---------+------+-----------+------------+----------------+ FV mid        no                                                     +--------------+---------+------+-----------+------------+----------------+  FV dist       no                                                      +--------------+---------+------+-----------+------------+----------------+ Popliteal     no                                                     +--------------+---------+------+-----------+------------+----------------+ GSV at SFJ              yes    >500 ms     0.568                     +--------------+---------+------+-----------+------------+----------------+ GSV prox thigh          yes    >500 ms      0.43                     +--------------+---------+------+-----------+------------+----------------+ GSV mid thigh           yes    >500 ms      0.43                     +--------------+---------+------+-----------+------------+----------------+ GSV dist thigh          yes    >500 ms      0.47                     +--------------+---------+------+-----------+------------+----------------+ GSV at knee             yes    >500 ms      0.47                     +--------------+---------+------+-----------+------------+----------------+ GSV prox calf           yes    >500 ms     0.348                     +--------------+---------+------+-----------+------------+----------------+ SSV Pop Fossa                              0.147                     +--------------+---------+------+-----------+------------+----------------+ SSV prox calf                                       chronic thrombus +--------------+---------+------+-----------+------------+----------------+ SSV mid calf                                        chronic thrombus +--------------+---------+------+-----------+------------+----------------+   Summary: Left: - Color duplex evaluation of the left lower extremity shows there is thrombus in the lesser saphenous vein. 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 - Venous reflux is noted in the left sapheno-femoral junction. - Venous reflux is noted in the left greater saphenous vein in the thigh. - Venous reflux is noted in  the left  greater saphenous vein in the calf.  *See table(s) above for measurements and observations. Electronically signed by Deitra Mayo MD on 11/17/2021 at 1:58:39 PM.    Final    DG Hand Complete Left  Result Date: 03/12/2022 CLINICAL DATA:  Left hand pain for 2-3 months EXAM: LEFT HAND - COMPLETE 3+ VIEW COMPARISON:  None Available. FINDINGS: Frontal, oblique, and lateral views of the left hand are obtained. No acute fracture, subluxation, or dislocation. There is joint space narrowing throughout the distal interphalangeal joints and first carpometacarpal joint, compatible with osteoarthritis. Benign-appearing soft tissue calcifications are seen within the first and second digits, which may be related to sequela of chronic trauma. Soft tissues are otherwise unremarkable. IMPRESSION: 1. Multifocal osteoarthritis as above.  No acute bony abnormality. Electronically Signed   By: Randa Ngo M.D.   On: 03/12/2022 20:46     Assessment & Plan:   Kashe was seen today for osteoarthritis and back pain.  Diagnoses and all orders for this visit:  Chronic hand pain, left- Plain films are consistent with osteoarthritis.  Will continue Tylenol as needed. -     DG Hand Complete Left; Future  Primary hypertension- His blood pressure is well controlled.   I am having Andrew Byars. Reisman "Barnabas Lister" maintain his Naproxen Sodium (ALEVE PO), fluticasone, aspirin EC, and atorvastatin.  No orders of the defined types were placed in this encounter.    Follow-up: No follow-ups on file.  Scarlette Calico, MD

## 2022-03-10 ENCOUNTER — Other Ambulatory Visit: Payer: Self-pay | Admitting: *Deleted

## 2022-03-10 DIAGNOSIS — I83812 Varicose veins of left lower extremities with pain: Secondary | ICD-10-CM

## 2022-03-14 ENCOUNTER — Encounter: Payer: Self-pay | Admitting: Internal Medicine

## 2022-04-04 ENCOUNTER — Other Ambulatory Visit: Payer: Self-pay | Admitting: *Deleted

## 2022-04-04 MED ORDER — LORAZEPAM 1 MG PO TABS: ORAL_TABLET | ORAL | 0 refills | Status: DC

## 2022-04-05 ENCOUNTER — Ambulatory Visit (INDEPENDENT_AMBULATORY_CARE_PROVIDER_SITE_OTHER): Payer: Medicare HMO | Admitting: *Deleted

## 2022-04-05 DIAGNOSIS — E538 Deficiency of other specified B group vitamins: Secondary | ICD-10-CM | POA: Diagnosis not present

## 2022-04-05 MED ORDER — CYANOCOBALAMIN 1000 MCG/ML IJ SOLN
1000.0000 ug | Freq: Once | INTRAMUSCULAR | Status: AC
  Administered 2022-04-05: 1000 ug via INTRAMUSCULAR

## 2022-04-05 NOTE — Progress Notes (Signed)
Pls cosign for B12 inj../lmb  

## 2022-04-11 ENCOUNTER — Encounter: Payer: Self-pay | Admitting: Internal Medicine

## 2022-04-12 ENCOUNTER — Encounter: Payer: Self-pay | Admitting: Vascular Surgery

## 2022-04-12 ENCOUNTER — Ambulatory Visit (INDEPENDENT_AMBULATORY_CARE_PROVIDER_SITE_OTHER): Payer: Medicare HMO | Admitting: Vascular Surgery

## 2022-04-12 VITALS — BP 126/73 | HR 73 | Temp 98.7°F | Resp 16 | Ht 69.5 in | Wt 173.0 lb

## 2022-04-12 DIAGNOSIS — I83812 Varicose veins of left lower extremities with pain: Secondary | ICD-10-CM

## 2022-04-12 HISTORY — PX: ENDOVENOUS ABLATION SAPHENOUS VEIN W/ LASER: SUR449

## 2022-04-12 NOTE — Progress Notes (Signed)
     Laser Ablation Procedure    Date: 04/12/2022   MURRIEL EIDEM DOB:1951/01/19  Consent signed: Yes      Surgeon: Cari Caraway MD   Procedure: Laser Ablation: left Greater Saphenous Vein  BP 126/73 (BP Location: Left Arm, Patient Position: Sitting, Cuff Size: Normal)   Pulse 73   Temp 98.7 F (37.1 C) (Temporal)   Resp 16   Ht 5' 9.5" (1.765 m)   Wt 173 lb (78.5 kg)   SpO2 98%   BMI 25.18 kg/m   Tumescent Anesthesia: 450 cc 0.9% NaCl with 50 cc Lidocaine HCL 1%  and 15 cc 8.4% NaHCO3  Local Anesthesia: 8 cc Lidocaine HCL and NaHCO3 (ratio 2:1)  7 watts continuous mode     Total energy: 1504 Joules    Total time: 214 cm  Treatment Length 31 cm              Laser Fiber Ref. #  [id number]       Lot # [id number]   Stab Phlebectomy: <10 Sites: Calf  Patient tolerated procedure well  Notes: Patient wore face mask.  All staff members wore facial masks. Mr. Severe took Ativan 1 mg (1 tablet) on 04-12-2022 at 7:15 AM and 8:15 AM.     Description of Procedure:  After marking the course of the secondary varicosities, the patient was placed on the operating table in the supine position, and the left leg was prepped and draped in sterile fashion.   Local anesthetic was administered and under ultrasound guidance the saphenous vein was accessed with a micro needle and guide wire; then the mirco puncture sheath was placed.  A guide wire was inserted saphenofemoral junction , followed by a 5 french sheath.  The position of the sheath and then the laser fiber below the junction was confirmed using the ultrasound.  Tumescent anesthesia was administered along the course of the saphenous vein using ultrasound guidance. The patient was placed in Trendelenburg position and protective laser glasses were placed on patient and staff, and the laser was fired at 7 watts continuous mode for a total of 1504 joules.   For stab phlebectomies, local anesthetic was administered at the previously marked  varicosities, and tumescent anesthesia was administered around the vessels.  Less than 10 stab wounds were made using the tip of an 11 blade. And using the vein hook, the phlebectomies were performed using a hemostat to avulse the varicosities.  Adequate hemostasis was achieved.     Steri strips were applied to the stab wounds and ABD pads and thigh high compression stockings were applied.  Ace wrap bandages were applied over the phlebectomy sites and at the top of the saphenofemoral junction. Blood loss was less than 15 cc.  Discharge instructions reviewed with patient and hardcopy of discharge instructions given to patient to take home. The patient taken to his car via wheelchair out of the operating room having tolerated the procedure well.

## 2022-04-12 NOTE — Progress Notes (Signed)
   Patient name: Andrew Stuart MRN: [id number] DOB: 12/15/1950 Sex: male  REASON FOR VISIT: For laser ablation of the left great saphenous vein and less than 10 stabs.  HPI: Andrew Stuart is a 71 y.o. male with C4 venous disease.  He has superficial venous reflux but no deep venous reflux.  We discussed conservative measures but he was continuing to have symptoms despite thigh-high compression stockings with a gradient of 20 to 30 mmHg, leg elevation, and exercise.  I felt he would be a candidate for laser ablation of the left great saphenous vein with some stab phlebectomies and also 1 to 2 units of sclero.  If his symptoms progress on the right side he would need formal venous reflux testing on the right.  Current Outpatient Medications  Medication Sig Dispense Refill   aspirin EC 81 MG tablet Take 1 tablet (81 mg total) by mouth daily. 90 tablet 1   atorvastatin (LIPITOR) 20 MG tablet Take 1 tablet (20 mg total) by mouth daily. 90 tablet 1   fluticasone (FLONASE) 50 MCG/ACT nasal spray Place into both nostrils daily.     LORazepam (ATIVAN) 1 MG tablet Take 1 tablet 30 minutes prior to leaving house on day of office surgery.  Bring second tablet with you to office on day of office surgery. 2 tablet 0   Naproxen Sodium (ALEVE PO) Take by mouth.     No current facility-administered medications for this visit.    PHYSICAL EXAM: Vitals:   04/12/22 0826  BP: 126/73  Pulse: 73  Resp: 16  Temp: 98.7 F (37.1 C)  TempSrc: Temporal  SpO2: 98%  Weight: 173 lb (78.5 kg)  Height: 5' 9.5" (1.765 m)    PROCEDURE: Laser ablation left great saphenous vein with less than 10 stabs  TECHNIQUE: The patient was taken to the exam room and the dilated varicose veins were marked in the left leg with the patient standing.  The patient was then placed supine.  I looked at the left great saphenous vein with the SonoSite and I felt that I could cannulate this in the distal thigh.  Left leg was prepped and  draped in usual sterile fashion.  Under ultrasound guidance, after the skin was anesthetized, I cannulated the left great saphenous vein in the distal thigh with a micropuncture needle and a micropuncture sheath was introduced over the wire.  I advanced the J-wire to just below the saphenofemoral junction.  The dilator and sheath were advanced over the wire and the wire and dilator removed.  The laser fiber was positioned at the end of the 45 cm sheath and the sheath retracted.  I position the laser fiber 2.5 cm distal to the saphenofemoral junction.  Tumescent anesthesia was then administered circumferentially around the vein.  The patient was placed in Trendelenburg.  We placed on laser glasses.  Laser ablation was performed of the left great saphenous vein from 2-1/2 cm distal to the saphenofemoral junction to the distal thigh.  50 J/cm was used at 7 W.  Attention was then turned to stab phlebectomies.  I made approximately 6 small stab incisions" the varicose veins.  They were brought above the skin and then gently avulsed with a hemostat.  Pressure was held.  Steri-Strips were applied.  A pressure dressing was applied.  The patient tolerated the procedure well and will return in 2 weeks for follow-up duplex.  Waverly Ferrari [facility] [place] [phone]

## 2022-04-26 ENCOUNTER — Ambulatory Visit (HOSPITAL_COMMUNITY)
Admission: RE | Admit: 2022-04-26 | Discharge: 2022-04-26 | Disposition: A | Discharge status: Home / Self Care | Payer: Medicare HMO | Source: Ambulatory Visit | Attending: Vascular Surgery | Admitting: Vascular Surgery

## 2022-04-26 ENCOUNTER — Ambulatory Visit (INDEPENDENT_AMBULATORY_CARE_PROVIDER_SITE_OTHER): Payer: Medicare HMO | Admitting: Vascular Surgery

## 2022-04-26 ENCOUNTER — Encounter: Payer: Self-pay | Admitting: Vascular Surgery

## 2022-04-26 VITALS — BP 115/75 | HR 72 | Temp 98.3°F | Resp 20 | Ht 69.5 in | Wt 173.0 lb

## 2022-04-26 DIAGNOSIS — I872 Venous insufficiency (chronic) (peripheral): Secondary | ICD-10-CM

## 2022-04-26 DIAGNOSIS — I83812 Varicose veins of left lower extremities with pain: Secondary | ICD-10-CM

## 2022-04-26 NOTE — Progress Notes (Signed)
   Patient name: Andrew Stuart MRN: [id number] DOB: 27-Nov-1950 Sex: male  REASON FOR VISIT: Follow-up after laser ablation of the left great saphenous vein with less than 10 stabs.  HPI: Andrew Stuart is a 71 y.o. male who I been following with C4 venous disease.  He had painful varicose veins of the left lower extremity and had failed conservative treatment.  He was felt to be a good candidate for laser ablation.  On 04/12/2022 he underwent laser ablation of left great saphenous vein down to the distal thigh.  He had less than 10 stabs.  Comes in for 2-week follow-up visit.  Has no specific complaints on the left.  He states that his leg feels better.  He is having significant aching pain on the right and would like to have this leg evaluated.  Current Outpatient Medications  Medication Sig Dispense Refill   aspirin EC 81 MG tablet Take 1 tablet (81 mg total) by mouth daily. 90 tablet 1   atorvastatin (LIPITOR) 20 MG tablet Take 1 tablet (20 mg total) by mouth daily. 90 tablet 1   fluticasone (FLONASE) 50 MCG/ACT nasal spray Place into both nostrils daily.     LORazepam (ATIVAN) 1 MG tablet Take 1 tablet 30 minutes prior to leaving house on day of office surgery.  Bring second tablet with you to office on day of office surgery. 2 tablet 0   Naproxen Sodium (ALEVE PO) Take by mouth.     No current facility-administered medications for this visit.   REVIEW OF SYSTEMS: Arly.Keller ] denotes positive finding; [  ] denotes negative finding  CARDIOVASCULAR:  [ ]  chest pain   [ ]  dyspnea on exertion  [ ]  leg swelling  CONSTITUTIONAL:  [ ]  fever   [ ]  chills  PHYSICAL EXAM: Vitals:   04/26/22 1026  BP: 115/75  Pulse: 72  Resp: 20  Temp: 98.3 F (36.8 C)  SpO2: 97%  Weight: 173 lb (78.5 kg)  Height: 5' 9.5" (1.765 m)   GENERAL: The patient is a well-nourished male, in no acute distress. The vital signs are documented above. CARDIOVASCULAR: There is a regular rate and rhythm. PULMONARY: There is  good air exchange bilaterally without wheezing or rales. VASCULAR: He has no significant bruising.  He has no significant leg swelling on the left.  DATA:  VENOUS DUPLEX: I have independently interpreted his venous duplex scan today.  On the left side there is no evidence of DVT.  The left great saphenous vein is successfully closed from the distal thigh to within 8 mm of the saphenofemoral junction.  MEDICAL ISSUES:  S/P LASER ABLATION LEFT GREAT SAPHENOUS VEIN/LESS THAN 10 STABS: The patient is doing well after his laser ablation of the left great saphenous vein.  He has been wearing his thigh-high compression stockings and elevating his legs.  I have encouraged him to remain active.  He is complaining of pain in the right leg and would like to have the right leg evaluated.  He has not had formal venous reflux testing on the right so we will set him up for formal venous reflux test on the right in January.  I will see him back at that time.  He knows to call sooner if he has problems.  [facility] [phone]

## 2022-05-03 ENCOUNTER — Other Ambulatory Visit: Payer: Self-pay

## 2022-05-03 DIAGNOSIS — I8393 Asymptomatic varicose veins of bilateral lower extremities: Secondary | ICD-10-CM

## 2022-05-03 DIAGNOSIS — I872 Venous insufficiency (chronic) (peripheral): Secondary | ICD-10-CM

## 2022-05-04 ENCOUNTER — Ambulatory Visit (INDEPENDENT_AMBULATORY_CARE_PROVIDER_SITE_OTHER): Payer: Medicare HMO | Admitting: *Deleted

## 2022-05-04 DIAGNOSIS — E538 Deficiency of other specified B group vitamins: Secondary | ICD-10-CM

## 2022-05-04 MED ORDER — CYANOCOBALAMIN 1000 MCG/ML IJ SOLN
1000.0000 ug | Freq: Once | INTRAMUSCULAR | Status: AC
  Administered 2022-05-04: 1000 ug via INTRAMUSCULAR

## 2022-05-04 NOTE — Progress Notes (Signed)
Pls cosign for B12 inj../lmb  

## 2022-06-05 ENCOUNTER — Ambulatory Visit (INDEPENDENT_AMBULATORY_CARE_PROVIDER_SITE_OTHER): Payer: Medicare HMO

## 2022-06-05 DIAGNOSIS — E538 Deficiency of other specified B group vitamins: Secondary | ICD-10-CM

## 2022-06-05 MED ORDER — CYANOCOBALAMIN 1000 MCG/ML IJ SOLN
1000.0000 ug | Freq: Once | INTRAMUSCULAR | Status: AC
  Administered 2022-06-05: 1000 ug via INTRAMUSCULAR

## 2022-06-05 NOTE — Progress Notes (Signed)
After obtaining consent, and per orders of Dr. Jones, injection of B12 given by Adisynn Suleiman P Gabriellia Rempel. Patient instructed to report any adverse reaction to me immediately.  

## 2022-06-06 ENCOUNTER — Telehealth: Payer: Self-pay

## 2022-06-06 NOTE — Telephone Encounter (Signed)
Attempted to reach pt to schedule his sclerotherapy appt. LVM to call back.

## 2022-06-21 ENCOUNTER — Ambulatory Visit (HOSPITAL_COMMUNITY)
Admission: RE | Admit: 2022-06-21 | Discharge: 2022-06-21 | Disposition: A | Discharge status: Home / Self Care | Payer: Medicare HMO | Source: Ambulatory Visit | Attending: Vascular Surgery | Admitting: Vascular Surgery

## 2022-06-21 ENCOUNTER — Ambulatory Visit (INDEPENDENT_AMBULATORY_CARE_PROVIDER_SITE_OTHER): Payer: Medicare HMO | Admitting: Vascular Surgery

## 2022-06-21 ENCOUNTER — Encounter: Payer: Self-pay | Admitting: Vascular Surgery

## 2022-06-21 VITALS — BP 128/75 | HR 75 | Temp 98.1°F | Resp 16 | Ht 69.0 in | Wt 171.0 lb

## 2022-06-21 DIAGNOSIS — I872 Venous insufficiency (chronic) (peripheral): Secondary | ICD-10-CM | POA: Diagnosis not present

## 2022-06-21 DIAGNOSIS — I8393 Asymptomatic varicose veins of bilateral lower extremities: Secondary | ICD-10-CM | POA: Diagnosis not present

## 2022-06-21 NOTE — Progress Notes (Signed)
REASON FOR VISIT:   Follow-up of chronic venous insufficiency  MEDICAL ISSUES:   CHRONIC VENOUS INSUFFICIENCY: The patient does have some deep venous reflux on the right but really no significant superficial venous reflux.  Therefore currently he is not a candidate for laser ablation of the right great saphenous vein.  We have discussed importance of leg elevation, compression therapy, and exercise.  If his symptoms progress in the future I be happy to see him back at any time.  HPI:   Andrew Stuart is a pleasant 72 y.o. male who previously underwent laser ablation of the left great saphenous vein on 04/12/2022.  He did well after this.  When I saw him last he was having some symptoms in the right leg and we set him up for a routine follow-up visit with formal venous reflux testing on the right.  Since I saw him last he continues to have some occasional aching pain in his legs associated with prolonged standing.  He has been wearing his compression stockings which help.  He does elevate his legs.  Overall symptoms have been stable.  He is scheduled to have sclerotherapy by Estell Harpin, RN in a couple of days.  Past Medical History:  Diagnosis Date   Allergy    Arthritis    Hepatitis    Hyperlipidemia     Family History  Problem Relation Age of Onset   Arthritis Mother    Diabetes Mother    Early death Mother    Heart disease Mother    Hyperlipidemia Mother    Colon cancer Mother    Early death Father    Early death Sister    Colon cancer Sister    Early death Brother    Lung cancer Brother    ALS Sister     SOCIAL HISTORY: Social History   Tobacco Use   Smoking status: Never    Passive exposure: Never   Smokeless tobacco: Never  Substance Use Topics   Alcohol use: Yes    Alcohol/week: 7.0 standard drinks of alcohol    Types: 7 Glasses of wine per week    Allergies  Allergen Reactions   Bactrim [Sulfamethoxazole-Trimethoprim] Rash   Azithromycin    Bee  Pollen    Blueberry Flavor    Strawberry Extract     Current Outpatient Medications  Medication Sig Dispense Refill   aspirin EC 81 MG tablet Take 1 tablet (81 mg total) by mouth daily. 90 tablet 1   atorvastatin (LIPITOR) 20 MG tablet Take 1 tablet (20 mg total) by mouth daily. 90 tablet 1   fluticasone (FLONASE) 50 MCG/ACT nasal spray Place into both nostrils daily.     Naproxen Sodium (ALEVE PO) Take by mouth.     LORazepam (ATIVAN) 1 MG tablet Take 1 tablet 30 minutes prior to leaving house on day of office surgery.  Bring second tablet with you to office on day of office surgery. (Patient not taking: Reported on 06/21/2022) 2 tablet 0   No current facility-administered medications for this visit.    REVIEW OF SYSTEMS:  '[X]'$  denotes positive finding, '[ ]'$  denotes negative finding Cardiac  Comments:  Chest pain or chest pressure:    Shortness of breath upon exertion:    Short of breath when lying flat:    Irregular heart rhythm:        Vascular    Pain in calf, thigh, or hip brought on by ambulation:    Pain in feet  at night that wakes you up from your sleep:     Blood clot in your veins:    Leg swelling:         Pulmonary    Oxygen at home:    Productive cough:     Wheezing:         Neurologic    Sudden weakness in arms or legs:     Sudden numbness in arms or legs:     Sudden onset of difficulty speaking or slurred speech:    Temporary loss of vision in one eye:     Problems with dizziness:         Gastrointestinal    Blood in stool:     Vomited blood:         Genitourinary    Burning when urinating:     Blood in urine:        Psychiatric    Major depression:         Hematologic    Bleeding problems:    Problems with blood clotting too easily:        Skin    Rashes or ulcers:        Constitutional    Fever or chills:     PHYSICAL EXAM:   Vitals:   06/21/22 1427  BP: 128/75  Pulse: 75  Resp: 16  Temp: 98.1 F (36.7 C)  TempSrc: Temporal  SpO2:  98%  Weight: 171 lb (77.6 kg)  Height: '5\' 9"'$  (1.753 m)    GENERAL: The patient is a well-nourished male, in no acute distress. The vital signs are documented above. CARDIAC: There is a regular rate and rhythm.  VASCULAR: I do not detect carotid bruits. He has palpable pedal pulses. He has some is bilaterally. PULMONARY: There is good air exchange bilaterally without wheezing or rales. MUSCULOSKELETAL: There are no major deformities or cyanosis. NEUROLOGIC: No focal weakness or paresthesias are detected. SKIN: There are no ulcers or rashes noted. PSYCHIATRIC: The patient has a normal affect.  DATA:    VENOUS DUPLEX: I have independently interpreted the patient's venous duplex scan.  This was of the right lower extremity only.  There was no evidence of DVT.  There was deep venous reflux in the common femoral vein only.  There was really minimal significant superficial venous reflux.  The vein had some reflux at the saphenofemoral junction mid thigh and proximal calf.  However the vein in these areas was quite small with diameters ranging from 2.4-3.2 mm.  The results of the study are summarized in the diagram below.     Deitra Mayo [facility] [phone]

## 2022-06-23 ENCOUNTER — Ambulatory Visit (INDEPENDENT_AMBULATORY_CARE_PROVIDER_SITE_OTHER): Payer: Medicare HMO

## 2022-06-23 DIAGNOSIS — I8393 Asymptomatic varicose veins of bilateral lower extremities: Secondary | ICD-10-CM

## 2022-06-23 DIAGNOSIS — I83813 Varicose veins of bilateral lower extremities with pain: Secondary | ICD-10-CM | POA: Diagnosis not present

## 2022-06-23 NOTE — Progress Notes (Signed)
Treated pt's LLE spider and small reticular veins with Asclera 1%  administered with a 27g butterfly.  Patient received a total of 4 mL. Pt tolerated well. Easy access; anticipate good results. Pt was given post treatment instructions on handout and verbally. Will call us if he has any questions/concerns.    Photos: Yes.    Compression stockings applied: Yes.

## 2022-07-05 ENCOUNTER — Encounter: Payer: Self-pay | Admitting: Internal Medicine

## 2022-07-05 ENCOUNTER — Ambulatory Visit (INDEPENDENT_AMBULATORY_CARE_PROVIDER_SITE_OTHER): Payer: Medicare HMO | Admitting: Internal Medicine

## 2022-07-05 VITALS — BP 124/80 | HR 74 | Temp 98.2°F | Ht 69.0 in | Wt 175.0 lb

## 2022-07-05 DIAGNOSIS — E538 Deficiency of other specified B group vitamins: Secondary | ICD-10-CM

## 2022-07-05 DIAGNOSIS — G603 Idiopathic progressive neuropathy: Secondary | ICD-10-CM

## 2022-07-05 DIAGNOSIS — E785 Hyperlipidemia, unspecified: Secondary | ICD-10-CM

## 2022-07-05 DIAGNOSIS — M5416 Radiculopathy, lumbar region: Secondary | ICD-10-CM | POA: Insufficient documentation

## 2022-07-05 DIAGNOSIS — M5441 Lumbago with sciatica, right side: Secondary | ICD-10-CM

## 2022-07-05 DIAGNOSIS — G8929 Other chronic pain: Secondary | ICD-10-CM | POA: Insufficient documentation

## 2022-07-05 DIAGNOSIS — I10 Essential (primary) hypertension: Secondary | ICD-10-CM

## 2022-07-05 LAB — CBC WITH DIFFERENTIAL/PLATELET
Basophils Absolute: 0 10*3/uL (ref 0.0–0.1)
Basophils Relative: 0.5 % (ref 0.0–3.0)
Eosinophils Absolute: 0.3 10*3/uL (ref 0.0–0.7)
Eosinophils Relative: 4.1 % (ref 0.0–5.0)
HCT: 42.6 % (ref 39.0–52.0)
Hemoglobin: 14.7 g/dL (ref 13.0–17.0)
Lymphocytes Relative: 28 % (ref 12.0–46.0)
Lymphs Abs: 1.8 10*3/uL (ref 0.7–4.0)
MCHC: 34.6 g/dL (ref 30.0–36.0)
MCV: 88.8 fl (ref 78.0–100.0)
Monocytes Absolute: 0.6 10*3/uL (ref 0.1–1.0)
Monocytes Relative: 8.7 % (ref 3.0–12.0)
Neutro Abs: 3.8 10*3/uL (ref 1.4–7.7)
Neutrophils Relative %: 58.7 % (ref 43.0–77.0)
Platelets: 250 10*3/uL (ref 150.0–400.0)
RBC: 4.8 Mil/uL (ref 4.22–5.81)
RDW: 13.5 % (ref 11.5–15.5)
WBC: 6.4 10*3/uL (ref 4.0–10.5)

## 2022-07-05 LAB — BASIC METABOLIC PANEL
BUN: 19 mg/dL (ref 6–23)
CO2: 29 mEq/L (ref 19–32)
Calcium: 9.7 mg/dL (ref 8.4–10.5)
Chloride: 102 mEq/L (ref 96–112)
Creatinine, Ser: 0.85 mg/dL (ref 0.40–1.50)
GFR: 87.63 mL/min (ref >60.00)
Glucose, Bld: 88 mg/dL (ref 70–99)
Potassium: 4.4 mEq/L (ref 3.5–5.1)
Sodium: 139 mEq/L (ref 135–145)

## 2022-07-05 LAB — HEPATIC FUNCTION PANEL
ALT: 21 U/L (ref 0–53)
AST: 24 U/L (ref 0–37)
Albumin: 4 g/dL (ref 3.5–5.2)
Alkaline Phosphatase: 83 U/L (ref 39–117)
Bilirubin, Direct: 0.1 mg/dL (ref 0.0–0.3)
Total Bilirubin: 0.6 mg/dL (ref 0.2–1.2)
Total Protein: 6.8 g/dL (ref 6.0–8.3)

## 2022-07-05 LAB — LIPID PANEL
Cholesterol: 185 mg/dL (ref 0–200)
HDL: 57.7 mg/dL (ref >39.00)
LDL Cholesterol: 98 mg/dL (ref 0–99)
NonHDL: 127.57
Total CHOL/HDL Ratio: 3
Triglycerides: 150 mg/dL — ABNORMAL HIGH (ref 0.0–149.0)
VLDL: 30 mg/dL (ref 0.0–40.0)

## 2022-07-05 LAB — CK: Total CK: 121 U/L (ref 7–232)

## 2022-07-05 MED ORDER — CYANOCOBALAMIN 1000 MCG/ML IJ SOLN
1000.0000 ug | Freq: Once | INTRAMUSCULAR | Status: AC
  Administered 2022-07-05: 1000 ug via INTRAMUSCULAR

## 2022-07-05 NOTE — Patient Instructions (Signed)

## 2022-07-05 NOTE — Progress Notes (Unsigned)
Subjective:  Patient ID: Andrew Stuart, male    DOB: [birth year]  Age: 72 y.o. MRN: [id number]  CC: Hypertension, Back Pain, and Hyperlipidemia   HPI Andrew Stuart presents for f/up ----  He complains of a 62-monthhistory of worsening right lower back pain that he describes as cramping and stiffness.  The pain radiates into his right lower extremity and he has worsening lower extremity paresthesias and incoordination.  He is active and denies chest pain, shortness of breath, diaphoresis, or edema.  Outpatient Medications Prior to Visit  Medication Sig Dispense Refill   aspirin EC 81 MG tablet Take 1 tablet (81 mg total) by mouth daily. 90 tablet 1   atorvastatin (LIPITOR) 20 MG tablet Take 1 tablet (20 mg total) by mouth daily. 90 tablet 1   fluticasone (FLONASE) 50 MCG/ACT nasal spray Place into both nostrils daily.     Naproxen Sodium (ALEVE PO) Take by mouth.     LORazepam (ATIVAN) 1 MG tablet Take 1 tablet 30 minutes prior to leaving house on day of office surgery.  Bring second tablet with you to office on day of office surgery. (Patient not taking: Reported on 06/21/2022) 2 tablet 0   No facility-administered medications prior to visit.    ROS Review of Systems  Constitutional:  Positive for fatigue. Negative for appetite change, chills, diaphoresis and unexpected weight change.  HENT: Negative.    Eyes: Negative.   Respiratory:  Negative for shortness of breath and wheezing.   Cardiovascular:  Negative for chest pain, palpitations and leg swelling.  Gastrointestinal:  Negative for abdominal pain, diarrhea, nausea and vomiting.  Endocrine: Negative.   Genitourinary: Negative.  Negative for difficulty urinating.  Musculoskeletal:  Positive for arthralgias, back pain and gait problem. Negative for joint swelling and myalgias.  Skin: Negative.   Neurological:  Positive for weakness and numbness. Negative for dizziness and light-headedness.  Hematological:  Negative for  adenopathy. Does not bruise/bleed easily.  Psychiatric/Behavioral: Negative.      Objective:  BP 124/80 (BP Location: Left Arm, Patient Position: Sitting, Cuff Size: Normal)   Pulse 74   Temp 98.2 F (36.8 C) (Oral)   Ht '5\' 9"'$  (1.753 m)   Wt 175 lb (79.4 kg)   SpO2 96%   BMI 25.84 kg/m   BP Readings from Last 3 Encounters:  07/05/22 124/80  06/21/22 128/75  04/26/22 115/75    Wt Readings from Last 3 Encounters:  07/05/22 175 lb (79.4 kg)  06/21/22 171 lb (77.6 kg)  04/26/22 173 lb (78.5 kg)    Physical Exam Vitals reviewed.  HENT:     Nose: Nose normal.     Mouth/Throat:     Mouth: Mucous membranes are moist.  Eyes:     General: No scleral icterus.    Conjunctiva/sclera: Conjunctivae normal.  Cardiovascular:     Rate and Rhythm: Normal rate and regular rhythm.     Pulses:          Dorsalis pedis pulses are 1+ on the right side and 1+ on the left side.       Posterior tibial pulses are 1+ on the right side and 1+ on the left side.     Heart sounds: No murmur heard.    No gallop.  Pulmonary:     Effort: Pulmonary effort is normal.     Breath sounds: No stridor. No wheezing, rhonchi or rales.  Abdominal:     General: Abdomen is flat.  Palpations: There is no mass.     Tenderness: There is no abdominal tenderness. There is no guarding.     Hernia: No hernia is present.  Musculoskeletal:        General: No swelling. Normal range of motion.     Cervical back: Neck supple.     Right lower leg: No edema.     Left lower leg: No edema.  Lymphadenopathy:     Cervical: No cervical adenopathy.  Skin:    General: Skin is warm and dry.     Findings: No rash.  Neurological:     Mental Status: He is alert.     Cranial Nerves: No cranial nerve deficit.     Sensory: No sensory deficit.     Motor: Weakness present.     Coordination: Coordination abnormal.     Gait: Gait abnormal.     Deep Tendon Reflexes: Reflexes abnormal.     Reflex Scores:      Tricep reflexes  are 1+ on the right side and 1+ on the left side.      Bicep reflexes are 1+ on the right side and 1+ on the left side.      Brachioradialis reflexes are 1+ on the right side and 1+ on the left side.      Patellar reflexes are 2+ on the right side and 1+ on the left side.      Achilles reflexes are 0 on the right side and 1+ on the left side.    Lab Results  Component Value Date   WBC 6.4 07/05/2022   HGB 14.7 07/05/2022   HCT 42.6 07/05/2022   PLT 250.0 07/05/2022   GLUCOSE 88 07/05/2022   CHOL 185 07/05/2022   TRIG 150.0 (H) 07/05/2022   HDL 57.70 07/05/2022   LDLCALC 98 07/05/2022   ALT 21 07/05/2022   AST 24 07/05/2022   NA 139 07/05/2022   K 4.4 07/05/2022   CL 102 07/05/2022   CREATININE 0.85 07/05/2022   BUN 19 07/05/2022   CO2 29 07/05/2022   TSH 2.03 10/06/2021   PSA 2.16 01/04/2022   HGBA1C 5.9 10/06/2021    VAS [place] LOWER EXTREMITY VENOUS REFLUX  Result Date: 06/21/2022  Lower Venous Reflux Study Patient Name:  Andrew Stuart  Date of Exam:   06/21/2022 Medical Rec #: [id number]       Accession #:    [id number] Date of Birth: 11/21/1950        Patient Gender: M Patient Age:   4 years Exam Location:  [facility] Procedure:      VAS [place] LOWER EXTREMITY VENOUS REFLUX Referring Phys: Harrell Gave DICKSON --------------------------------------------------------------------------------  Indications: Edema.  Performing Technologist: Alvia Grove RVT  Examination Guidelines: A complete evaluation includes B-mode imaging, spectral Doppler, color Doppler, and power Doppler as needed of all accessible portions of each vessel. Bilateral testing is considered an integral part of a complete examination. Limited examinations for reoccurring indications may be performed as noted. The reflux portion of the exam is performed with the patient in reverse Trendelenburg. Significant venous reflux is defined as >500 ms in the superficial venous system, and >1 second in the deep venous  system.  Venous Reflux Times +--------------+---------+------+-----------+------------+----------------+ RIGHT         Reflux NoRefluxReflux TimeDiameter cmsComments  Yes                                          +--------------+---------+------+-----------+------------+----------------+ CFV                     yes   >1 second                              +--------------+---------+------+-----------+------------+----------------+ FV mid        no                                                     +--------------+---------+------+-----------+------------+----------------+ Popliteal     no                                                     +--------------+---------+------+-----------+------------+----------------+ GSV at SFJ              yes    >500 ms      0.30                     +--------------+---------+------+-----------+------------+----------------+ GSV prox thighno                            0.26                     +--------------+---------+------+-----------+------------+----------------+ GSV mid thigh           yes    >500 ms      0.25                     +--------------+---------+------+-----------+------------+----------------+ GSV dist thighno                            0.45                     +--------------+---------+------+-----------+------------+----------------+ GSV at knee             yes    >500 ms      0.32                     +--------------+---------+------+-----------+------------+----------------+ GSV prox calf           yes    >500 ms      0.36                     +--------------+---------+------+-----------+------------+----------------+ GSV mid calf            yes    >500 ms      0.24                     +--------------+---------+------+-----------+------------+----------------+ SSV Pop Fossa no                            0.26                      +--------------+---------+------+-----------+------------+----------------+  SSV prox calf no                            0.16    chronic thrombus +--------------+---------+------+-----------+------------+----------------+ SSV mid calf  no                            0.20    chronic thrombus +--------------+---------+------+-----------+------------+----------------+ AASV O        no                                    NV               +--------------+---------+------+-----------+------------+----------------+ AASV P        no                                    too small        +--------------+---------+------+-----------+------------+----------------+   Summary: Right: - No evidence of deep vein thrombosis seen in the right lower extremity, from the common femoral through the popliteal veins. - No evidence of superficial venous reflux seen in the right short saphenous vein. - Venous reflux is noted in the right common femoral vein. - Venous reflux is noted in the right sapheno-femoral junction. - Venous reflux is noted in the right greater saphenous vein in the thigh. - Venous reflux is noted in the right greater saphenous vein in the calf.  *See table(s) above for measurements and observations. Electronically signed by Deitra Mayo MD on 06/21/2022 at 2:41:53 PM.    Final     Assessment & Plan:   Gradin was seen today for hypertension, back pain and hyperlipidemia.  Diagnoses and all orders for this visit:  Vitamin B12 deficiency -     CBC with Differential/Platelet; Future -     CBC with Differential/Platelet -     cyanocobalamin (VITAMIN B12) injection 1,000 mcg  Idiopathic progressive neuropathy  Primary hypertension-his blood pressure is adequately well-controlled. -     Basic metabolic panel; Future -     Hepatic function panel; Future -     Hepatic function panel -     Basic metabolic panel  Hyperlipidemia with target LDL less than 130- LDL goal achieved.  Doing well on the statin  -     Lipid panel; Future -     Hepatic function panel; Future -     CK; Future -     CK -     Hepatic function panel -     Lipid panel  Right lumbar radiculitis- His symptoms and exam are concerning for a spinal cord lesion.  I recommended that he undergo an MRI without contrast to see if he has something that could be treated with a surgical intervention. -     MR Lumbar Spine Wo Contrast; Future  Chronic right-sided low back pain with right-sided sciatica -     MR Lumbar Spine Wo Contrast; Future   I have discontinued Benson Setting. Neu "Jack"'s LORazepam. I am also having him maintain his Naproxen Sodium (ALEVE PO), fluticasone, aspirin EC, and atorvastatin. We administered cyanocobalamin.  Meds ordered this encounter  Medications   cyanocobalamin (VITAMIN B12) injection 1,000 mcg     Follow-up: Return in about 6 months (around 01/05/2023).  Marcello Moores  Ronnald Ramp, MD

## 2022-07-06 ENCOUNTER — Encounter: Payer: Self-pay | Admitting: Internal Medicine

## 2022-07-17 ENCOUNTER — Other Ambulatory Visit: Payer: Self-pay | Admitting: Internal Medicine

## 2022-07-17 DIAGNOSIS — M5416 Radiculopathy, lumbar region: Secondary | ICD-10-CM

## 2022-07-17 DIAGNOSIS — G8929 Other chronic pain: Secondary | ICD-10-CM

## 2022-07-21 ENCOUNTER — Encounter: Payer: Self-pay | Admitting: Physical Medicine and Rehabilitation

## 2022-07-21 ENCOUNTER — Encounter: Payer: Self-pay | Admitting: Internal Medicine

## 2022-07-23 ENCOUNTER — Other Ambulatory Visit: Payer: Medicare HMO

## 2022-08-02 ENCOUNTER — Ambulatory Visit (INDEPENDENT_AMBULATORY_CARE_PROVIDER_SITE_OTHER): Payer: Medicare HMO | Admitting: *Deleted

## 2022-08-02 DIAGNOSIS — E538 Deficiency of other specified B group vitamins: Secondary | ICD-10-CM | POA: Diagnosis not present

## 2022-08-02 MED ORDER — CYANOCOBALAMIN 1000 MCG/ML IJ SOLN
1000.0000 ug | Freq: Once | INTRAMUSCULAR | Status: AC
  Administered 2022-08-02: 1000 ug via INTRAMUSCULAR

## 2022-08-02 NOTE — Progress Notes (Signed)
Pls cosign for B12 inj../lmb  

## 2022-08-05 ENCOUNTER — Other Ambulatory Visit: Payer: Self-pay | Admitting: Internal Medicine

## 2022-08-05 DIAGNOSIS — E785 Hyperlipidemia, unspecified: Secondary | ICD-10-CM

## 2022-08-16 ENCOUNTER — Encounter: Payer: Medicare HMO | Attending: Physical Medicine and Rehabilitation | Admitting: Physical Medicine and Rehabilitation

## 2022-08-16 ENCOUNTER — Encounter: Payer: Self-pay | Admitting: Physical Medicine and Rehabilitation

## 2022-08-16 VITALS — BP 127/79 | HR 65 | Ht 69.0 in | Wt 172.0 lb

## 2022-08-16 DIAGNOSIS — M47816 Spondylosis without myelopathy or radiculopathy, lumbar region: Secondary | ICD-10-CM | POA: Diagnosis present

## 2022-08-16 DIAGNOSIS — E538 Deficiency of other specified B group vitamins: Secondary | ICD-10-CM | POA: Insufficient documentation

## 2022-08-16 DIAGNOSIS — M629 Disorder of muscle, unspecified: Secondary | ICD-10-CM | POA: Diagnosis present

## 2022-08-16 DIAGNOSIS — I872 Venous insufficiency (chronic) (peripheral): Secondary | ICD-10-CM | POA: Insufficient documentation

## 2022-08-16 DIAGNOSIS — G8929 Other chronic pain: Secondary | ICD-10-CM

## 2022-08-16 DIAGNOSIS — G603 Idiopathic progressive neuropathy: Secondary | ICD-10-CM | POA: Diagnosis present

## 2022-08-16 DIAGNOSIS — M5441 Lumbago with sciatica, right side: Secondary | ICD-10-CM | POA: Diagnosis present

## 2022-08-16 MED ORDER — GABAPENTIN 300 MG PO CAPS: 300.0000 mg | ORAL_CAPSULE | Freq: Three times a day (TID) | ORAL | 2 refills | Status: DC

## 2022-08-16 NOTE — Progress Notes (Deleted)
Subjective:    Patient ID: Andrew Stuart, male    DOB: December 25, 1950, 72 y.o.   MRN: [id number]  HPI   Pain Inventory Average Pain 3 Pain Right Now 1 My pain is burning and aching  LOCATION OF PAIN  ***  BOWEL Number of stools per week: *** Oral laxative use {YES/NO:21197} Type of laxative *** Enema or suppository use {YES/NO:21197} History of colostomy {YES/NO:21197} Incontinent {YES/NO:21197}  BLADDER {bladder options:24190} In and out cath, frequency *** Able to self cath {YES/NO:21197} Bladder incontinence {YES/NO:21197} Frequent urination {YES/NO:21197} Leakage with coughing {YES/NO:21197} Difficulty starting stream {YES/NO:21197} Incomplete bladder emptying {YES/NO:21197}   Mobility {MOBILITY WUJ:81191478}  Function {FUNCTION:21022946}  Neuro/Psych {NEURO/PSYCH:21022948}  Prior Studies {CPRM PRIOR STUDIES:21022953}  Physicians involved in your care {CPRM PHYSICIANS INVOLVED IN YOUR CARE:21022954}   Family History  Problem Relation Age of Onset   Arthritis Mother    Diabetes Mother    Early death Mother    Heart disease Mother    Hyperlipidemia Mother    Colon cancer Mother    Early death Father    Early death Sister    Colon cancer Sister    ALS Sister    Early death Brother    Lung cancer Brother    Liver cancer Brother    Social History   Socioeconomic History   Marital status: Married    Spouse name: Not on file   Number of children: Not on file   Years of education: Not on file   Highest education level: Not on file  Occupational History   Not on file  Tobacco Use   Smoking status: Never    Passive exposure: Never   Smokeless tobacco: Never  Vaping Use   Vaping Use: Never used  Substance and Sexual Activity   Alcohol use: Yes    Alcohol/week: 7.0 standard drinks of alcohol    Types: 7 Glasses of wine per week   Drug use: Never   Sexual activity: Yes    Partners: Male    Birth control/protection: Condom  Other Topics  Concern   Not on file  Social History Narrative   Not on file   Social Determinants of Health   Financial Resource Strain: Low Risk  (01/16/2022)   Overall Financial Resource Strain (CARDIA)    Difficulty of Paying Living Expenses: Not hard at all  Food Insecurity: No Food Insecurity (01/16/2022)   Hunger Vital Sign    Worried About Running Out of Food in the Last Year: Never true    Ran Out of Food in the Last Year: Never true  Transportation Needs: No Transportation Needs (01/16/2022)   PRAPARE - Administrator, Civil Service (Medical): No    Lack of Transportation (Non-Medical): No  Physical Activity: Sufficiently Active (01/16/2022)   Exercise Vital Sign    Days of Exercise per Week: 6 days    Minutes of Exercise per Session: 60 min  Stress: No Stress Concern Present (01/16/2022)   [employer] of Occupational Health - Occupational Stress Questionnaire    Feeling of Stress : Not at all  Social Connections: Moderately Isolated (01/16/2022)   Social Connection and Isolation Panel [NHANES]    Frequency of Communication with Friends and Family: More than three times a week    Frequency of Social Gatherings with Friends and Family: More than three times a week    Attends Religious Services: Never    Database administrator or Organizations: No    Attends [employer]  or Organization Meetings: Never    Marital Status: Married   Past Surgical History:  Procedure Laterality Date   CARPAL TUNNEL RELEASE Right 2015   ENDOVENOUS ABLATION SAPHENOUS VEIN W/ LASER Left 04/12/2022   endovenous laser ablation left greater saphenous vein and stab phlebectomy < 10 incisions left leg by Cari Caraway MD   TONSILLECTOMY     Past Medical History:  Diagnosis Date   Allergy    Arthritis    Hepatitis    Hyperlipidemia    Ht  (1.753 m)   Wt 172 lb (78 kg)   BMI 25.40 kg/m   Opioid Risk Score:   Fall Risk Score:  `1  Depression screen PHQ 2/9     07/05/2022    1:00 PM  01/16/2022    8:46 AM 10/06/2021   11:32 AM 08/26/2020    9:54 AM 04/28/2020    8:34 AM  Depression screen PHQ 2/9  Decreased Interest 0 0 0 0 0  Down, Depressed, Hopeless 0 0 0 0 0  PHQ - 2 Score 0 0 0 0 0    Review of Systems     Objective:   Physical Exam        Assessment & Plan:

## 2022-08-16 NOTE — Patient Instructions (Signed)
I have prescribed you gabapentin 300 mg capsules.  Start with 1 capsule at nighttime.  Continue for 2 to 3 days.  If no side effects, no increased lethargy, can increase up to 1 capsule 3 times daily.  Please call clinic in 2 weeks to report any side effects.  I am sending you to physical therapy to work on your back pain and bilateral hamstring tightness.  Once you have completed physical therapy, we can reattempt authorization for an MRI of your low back for possible intervention planning.  In the interim, I will have you schedule an EMG with either myself or Dr. Wynn Banker.  We will schedule a general follow-up in 2 to 3 months.

## 2022-08-18 NOTE — Progress Notes (Deleted)
   Established Patient Office Visit  Subjective   Patient ID: Andrew Stuart, male    DOB: 1950-12-06  Age: 72 y.o. MRN: [id number]  No chief complaint on file.   HPI  {History (Optional):23778}  ROS    Objective:     BP 127/79   Pulse 65   Ht  (1.753 m)   Wt 172 lb (78 kg)   SpO2 96%   BMI 25.40 kg/m  {Vitals History (Optional):23777}  Physical Exam   No results found for any visits on 08/16/22.  {Labs (Optional):23779}  The 10-year ASCVD risk score (Arnett DK, et al., 2019) is: 17.4%    Assessment & Plan:   Problem List Items Addressed This Visit       Cardiovascular and Mediastinum   Chronic venous insufficiency     Nervous and Auditory   Idiopathic progressive neuropathy   Chronic right-sided low back pain with right-sided sciatica - Primary   Relevant Medications   gabapentin (NEURONTIN) 300 MG capsule   Other Relevant Orders   Ambulatory referral to Physical Therapy     Musculoskeletal and Integument   Facet arthropathy, lumbar   Relevant Orders   Ambulatory referral to Physical Therapy   Hamstring tightness of both lower extremities     Other   Vitamin B12 deficiency    Return for EMG RLE - schedule with myself or Dr. Wynn Banker - L5 radiculopathy vs peroneal neuropathy.    Charise Carwin, RMA

## 2022-08-20 NOTE — Progress Notes (Signed)
Subjective:      Subjective  Patient ID: Andrew Stuart, male    DOB: [birth year], 71 y.o.   MRN: [id number]   HPI     Andrew Stuart is a 72 y.o. year old male  who  has a past medical history of Allergy, Arthritis, Hepatitis, and Hyperlipidemia.   They are presenting to PM&R clinic as a new patient for pain management evaluation. They were referred by Dr. Sanda Linger for treatment of lumbar radiculitis pain.    Per his last note 07/05/22:  "He complains of a 23-month history of worsening right lower back pain that he describes as cramping and stiffness. The pain radiates into his right lower extremity and he has worsening lower extremity paresthesias and incoordination. "   Lumbar xray 01/2021: IMPRESSION: Advanced spondylosis, without acute osseous abnormality.   [facility] MRI 03/2021: IMPRESSION: 1. No acute process or suspicious mass identified in the pelvis. 2. Prostatomegaly. 3. Mild colonic diverticulosis. 4. 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   Insurance denied lumbar MRI   Source: Across low back and RLE; weakness in both legs, R>L Inciting incident: Picked up a box last August, 25 lbs of peanuts; he felt a cramp and 8/10 pain immediately afterward. His husband took him to [facility], they diagnosed him with a pulled muscle. He got lumbar xray at that time.    He does note his weakness started around the time he was diagnosed with B12 deficiency. He is compliant with B12 injection and had good levels at last check.    Description of pain: "Hamstring is more of a pain, but it's more of a weakness." Pain quality is cramping, intermittent.   Exacerbating factors: bending forward, bending backwards, laying flat, sitting, standing, activity, rest, and cold Remitting factors: heating pad and relaxing with stretching in front of the TV Red flag symptoms: No red flags for back pain endorsed in Hx or  ROS   Medications tried: Topical medications (mild effect) : CBD cream, PRN Nsaids (mild effect) : Will take one aleve PRN Tylenol  (mild effect) : Uses tylenol intermittently, but is not as effective as aleve Opiates  (never tried) : never Gabapentin / Lyrica  (no effect) : Was on Lyrica 25 mg TID for neuropathy, did not feel it was effective TCAs  (never tried) :  SNRIs  (never tried) :  Other  (moderate effect) : Methocarbamol for a short time, which helped for cramping; not numbness or weakness. , and a steroid taper for a short time.    Other treatments: PT/OT  (mild effect) : Had PT last year for LBP and hamstring tendonitis emergortho; still having symptoms in the hip and knee but did have some mild benefit at the time.    Accupuncture/chiropractor/massage  (never tried) :  TENs unit (never tried) :  Injections (never tried) :  Surgery (never tried) :  Other  (mild effect) : Heat pad   Goals for pain control: would like to get better with weakness so he can go back to the gym; he does do gardening, mowing, and yardwork.    Prior UDS results:  Labs (Brief)  No results found for: "LABOPIA", "COCAINSCRNUR", "LABBENZ", "AMPHETMU", "THCU", "LABBARB"       Pain Inventory Average Pain 3 Pain Right Now 1 My pain is burning and aching   In the last 24 hours, has pain interfered with the following? General activity 2  Relation with others 0 Enjoyment of life 2 What TIME of day is your pain at its worst? morning  Sleep (in general) Good   Pain is worse with: walking and standing Pain improves with: rest, heat/ice, therapy/exercise, and medication Relief from Meds: 7   walk without assistance how many minutes can you walk? 60 ability to climb steps?  yes do you drive?  yes   retired   weakness numbness trouble walking spasms   none   Any changes since last visit?  no            Family History  Problem Relation Age of Onset   Arthritis Mother     Diabetes  Mother     Early death Mother     Heart disease Mother     Hyperlipidemia Mother     Colon cancer Mother     Early death Father     Early death Sister     Colon cancer Sister     ALS Sister     Early death Brother     Lung cancer Brother     Liver cancer Brother      Social History         Socioeconomic History   Marital status: Married      Spouse name: Not on file   Number of children: Not on file   Years of education: Not on file   Highest education level: Not on file  Occupational History   Not on file  Tobacco Use   Smoking status: Never      Passive exposure: Never   Smokeless tobacco: Never  Vaping Use   Vaping Use: Never used  Substance and Sexual Activity   Alcohol use: Yes      Alcohol/week: 7.0 standard drinks of alcohol      Types: 7 Glasses of wine per week   Drug use: Never   Sexual activity: Yes      Partners: Male      Birth control/protection: Condom  Other Topics Concern   Not on file  Social History Narrative   Not on file    Social Determinants of Health        Financial Resource Strain: Low Risk  (01/16/2022)    Overall Financial Resource Strain (CARDIA)     Difficulty of Paying Living Expenses: Not hard at all  Food Insecurity: No Food Insecurity (01/16/2022)    Hunger Vital Sign     Worried About Running Out of Food in the Last Year: Never true     Ran Out of Food in the Last Year: Never true  Transportation Needs: No Transportation Needs (01/16/2022)    PRAPARE - Therapist, art (Medical): No     Lack of Transportation (Non-Medical): No  Physical Activity: Sufficiently Active (01/16/2022)    Exercise Vital Sign     Days of Exercise per Week: 6 days     Minutes of Exercise per Session: 60 min  Stress: No Stress Concern Present (01/16/2022)    [employer] of Occupational Health - Occupational Stress Questionnaire     Feeling of Stress : Not at all  Social Connections: Moderately Isolated (01/16/2022)     Social Connection and Isolation Panel [NHANES]     Frequency of Communication with Friends and Family: More than three times a week     Frequency of Social Gatherings with Friends and Family: More than three times a week  Attends Religious Services: Never     Active Member of Clubs or Organizations: No     Attends Banker Meetings: Never     Marital Status: Married         Past Surgical History:  Procedure Laterality Date   CARPAL TUNNEL RELEASE Right 2015   ENDOVENOUS ABLATION SAPHENOUS VEIN W/ LASER Left 04/12/2022    endovenous laser ablation left greater saphenous vein and stab phlebectomy < 10 incisions left leg by Cari Caraway MD   TONSILLECTOMY            Past Medical History:  Diagnosis Date   Allergy     Arthritis     Hepatitis     Hyperlipidemia      Ht  (1.753 m)   Wt 172 lb (78 kg)   BMI 25.40 kg/m    Opioid Risk Score:   Fall Risk Score:  `1   Depression screen [facility] 2/9       08/16/2022    9:54 AM 07/05/2022    1:00 PM 01/16/2022    8:46 AM 10/06/2021   11:32 AM 08/26/2020    9:54 AM 04/28/2020    8:34 AM  Depression screen PHQ 2/9  Decreased Interest 0 0 0 0 0 0  Down, Depressed, Hopeless 0 0 0 0 0 0  PHQ - 2 Score 0 0 0 0 0 0  Altered sleeping 0            Tired, decreased energy 1            Change in appetite 0            Feeling bad or failure about yourself  0            Trouble concentrating 0            Moving slowly or fidgety/restless 0            PHQ-9 Score 1                Review of Systems  Musculoskeletal:        Trouble walking   Neurological:  Positive for weakness and numbness.       Spasms  All other systems reviewed and are negative.         Objective:    Objective Expand by Default Physical Exam     PE: Constitution: Appropriate appearance for age. No apparent distress   Resp: No respiratory distress. No accessory muscle usage. on RA and CTAB Cardio: Well perfused appearance. 1+peripheral  edema, with thigh-high compression hose. Abdomen: Nondistended. Nontender.   Psych: Appropriate mood and affect. Neuro: AAOx4. No apparent cognitive deficits    Neurologic Exam:   DTRs: Reflexes were 2+ in bilateral achilles, patella, biceps, BR and triceps. Babinsky: flexor responses b/l.   Hoffmans: negative b/l Sensory exam: revealed normal sensation in all dermatomal regions in bilateral lower extremities and with reduced sensation to light touch in right lateral mid-shin about 6 inches below fibular head Motor exam: strength 5/5 throughout bilateral lower extremities and with exception of R 1st toe 4+/5 Coordination: Fine motor coordination was normal.   Gait: normal   Back and hip exam:  + Muscle tightness in R lumbar and thoracic paraspinals, bilateral lateral hamstrings R>L Mild dextroscoliosis Mild back pain with facet loading +R outter hip pain with FABER No TTP throughout         Assessment & Plan:    Andrew Stuart is a 72 y.o. year old male  who  has a past medical history of Allergy, Arthritis, Hepatitis, and Hyperlipidemia.   They are presenting to PM&R clinic as a new patient for treatment of  lumbar radiculitis pain . They were referred by Dr. Sanda Linger .     Chronic right-sided low back pain with right-sided sciatica -     Ambulatory referral to Physical Therapy I have prescribed you gabapentin 300 mg capsules.  Start with 1 capsule at nighttime.  Continue for 2 to 3 days.  If no side effects, no increased lethargy, can increase up to 1 capsule 3 times daily.  Please call clinic in 2 weeks to report any side effects.   Once you have completed physical therapy, we can reattempt authorization for an MRI of your low back for possible intervention planning.   In the interim, I will have you schedule an EMG with either myself or Dr. Wynn Banker.  We will schedule a general follow-up in 2 to 3 months.   Facet arthropathy, lumbar -     Ambulatory referral to Physical  Therapy Lumbar xray 01/2021: IMPRESSION: Advanced spondylosis, without acute osseous abnormality.   Idiopathic progressive neuropathy -     Gabapentin; Take 1 capsule (300 mg total) by mouth 3 (three) times daily. Start with 1 capsule at nighttime.  Continue for 2 to 3 days.  If no side effects, no increased lethargy, can increase up to 1 capsule 3 times daily.  Please call clinic in 2 weeks to report any side effects.  Dispense: 90 capsule; Refill: 2     Vitamin B12 deficiency Reportedly WNL on last levels check and complicant with injections Does note pain started around time he was diagnosed with this   Hamstring tightness of both lower extremities I am sending you to physical therapy to work on your back pain and bilateral hamstring tightness.     [facility] MRI 03/2021: IMPRESSION: 1. No acute process or suspicious mass identified in the pelvis. 2. Prostatomegaly. 3. Mild colonic diverticulosis. 4. 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

## 2022-08-23 ENCOUNTER — Ambulatory Visit: Payer: Medicare HMO | Attending: Physical Medicine and Rehabilitation

## 2022-08-23 ENCOUNTER — Encounter: Payer: Self-pay | Admitting: Physical Medicine and Rehabilitation

## 2022-08-23 DIAGNOSIS — M47816 Spondylosis without myelopathy or radiculopathy, lumbar region: Secondary | ICD-10-CM | POA: Insufficient documentation

## 2022-08-23 DIAGNOSIS — M5441 Lumbago with sciatica, right side: Secondary | ICD-10-CM | POA: Insufficient documentation

## 2022-08-23 DIAGNOSIS — R293 Abnormal posture: Secondary | ICD-10-CM | POA: Insufficient documentation

## 2022-08-23 DIAGNOSIS — G8929 Other chronic pain: Secondary | ICD-10-CM | POA: Diagnosis not present

## 2022-08-23 DIAGNOSIS — M6281 Muscle weakness (generalized): Secondary | ICD-10-CM | POA: Insufficient documentation

## 2022-08-23 DIAGNOSIS — R2689 Other abnormalities of gait and mobility: Secondary | ICD-10-CM | POA: Diagnosis present

## 2022-08-23 NOTE — Therapy (Signed)
OUTPATIENT PHYSICAL THERAPY THORACOLUMBAR EVALUATION   Patient Name: Andrew Stuart MRN: [id number] DOB:1950-08-15, 72 y.o., male Today's Date: 08/23/2022  END OF SESSION:  PT End of Session - 08/23/22 1047     Visit Number 1    Number of Visits 7    Date for PT Re-Evaluation 10/06/22    Authorization Type [employer]    PT Start Time 1100    PT Stop Time 1140    PT Time Calculation (min) 40 min    Activity Tolerance Patient tolerated treatment well    Behavior During Therapy [facility] for tasks assessed/performed             Past Medical History:  Diagnosis Date   Allergy    Arthritis    Hepatitis    Hyperlipidemia    Past Surgical History:  Procedure Laterality Date   CARPAL TUNNEL RELEASE Right 2015   ENDOVENOUS ABLATION SAPHENOUS VEIN W/ LASER Left 04/12/2022   endovenous laser ablation left greater saphenous vein and stab phlebectomy < 10 incisions left leg by Cari Caraway MD   TONSILLECTOMY     Patient Active Problem List   Diagnosis Date Noted   Facet arthropathy, lumbar 08/16/2022   Hamstring tightness of both lower extremities 08/16/2022   Right lumbar radiculitis 07/05/2022   Chronic right-sided low back pain with right-sided sciatica 07/05/2022   Chronic hand pain, left 03/09/2022   Primary hypertension 03/09/2022   Encounter for general adult medical examination with abnormal findings 01/04/2022   Idiopathic progressive neuropathy 10/06/2021   Hyperglycemia 10/06/2021   Varicose veins of both lower extremities with pain 10/06/2021   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 03/01/2021   Abnormal electrocardiogram (ECG) (EKG) 03/01/2021   Vitamin B12 deficiency 02/18/2021   Chronic venous insufficiency 02/17/2021   Hyperlipidemia with target LDL less than 130 08/26/2020   BPH without obstruction/lower urinary tract symptoms 08/26/2020   Carpal tunnel syndrome on left 04/28/2020   Nephrolithiasis 03/02/2015    PCP: Sanda Linger, MD  REFERRING  PROVIDER: Angelina Sheriff, DO  REFERRING DIAG: [phone] (ICD-10-CM) - Chronic right-sided low back pain with right-sided sciatica M47.816 (ICD-10-CM) - Facet arthropathy, lumbar  Rationale for Evaluation and Treatment: Rehabilitation  THERAPY DIAG:  Abnormal posture - Plan: PT plan of care cert/re-cert  Muscle weakness (generalized) - Plan: PT plan of care cert/re-cert  Other abnormalities of gait and mobility - Plan: PT plan of care cert/re-cert  ONSET DATE: 08/16/2022 referral  SUBJECTIVE:  SUBJECTIVE STATEMENT: Patient arrives to clinic with spouse who stays in waiting room. Ambulating without an AD. Reports long h/o LBP, but injury in August 2023 exacerbated pain. Does have continued numbness in distal R LE. R hamstring will "catch" sometimes and feels tight. Current pain level 0 when seated. When hamstring "pops" pain can get as high as 4-5/10.   PERTINENT HISTORY:  Back injury August 2023, hepatitis  PAIN:  Are you having pain? No  PRECAUTIONS: Fall  WEIGHT BEARING RESTRICTIONS: No  FALLS:  Has patient fallen in last 6 months? No  LIVING ENVIRONMENT: Lives with: lives with their spouse Lives in: House/apartment Stairs: Yes: Internal: 14 steps; on right going up and External: 2 steps; none Has following equipment at home: Single point cane and Walker - 2 wheeled  OCCUPATION: retired  PLOF: Independent  PATIENT GOALS: "to get back close to where I was; to get back to the gym"  OBJECTIVE:   DIAGNOSTIC FINDINGS:  Lumbar xray 01/2021: IMPRESSION: Advanced spondylosis, without acute osseous abnormality.   [facility] MRI 03/2021: IMPRESSION: 1. No acute process or suspicious mass identified in the pelvis. 2. Prostatomegaly. 3. Mild colonic diverticulosis. 4. Tendinosis of the  bilateral proximal hamstring tendons which is moderate to severe on the left with partial tearing.   Insurance denied lumbar MRI  PATIENT SURVEYS:  Modified Oswestry 7; mild disability    SCREENING FOR RED FLAGS: Bowel or bladder incontinence: No Spinal tumors: No Cauda equina syndrome: No Compression fracture: No Abdominal aneurysm: No  COGNITION: Overall cognitive status: Within functional limits for tasks assessed     SENSATION: Decreased light touch and pain to distal R lateral LE  MUSCLE LENGTH:   POSTURE: posterior pelvic tilt  PALPATION: Areas of numbness across low back originating on R side   LUMBAR ROM:  WFL with no pain provocation   LOWER EXTREMITY MMT:    MMT Right eval Left eval  Hip flexion    Hip extension    Hip abduction    Hip adduction    Hip internal rotation    Hip external rotation    Knee flexion    Knee extension    Ankle dorsiflexion 4+ 5  Ankle plantarflexion 4 5  Ankle inversion 4 (Pain) 5  Ankle eversion 4+ 5   (Blank rows = not tested)  LUMBAR SPECIAL TESTS:  Slump test: Negative, Quadrant test: Positive, Trendelenburg sign: Positive, and Thomas test: Positive  GAIT: Distance walked: clinic Assistive device utilized: None Level of assistance: Complete Independence Comments: no obvious gait impairments  TODAY'S TREATMENT:                                                                                                                              N/a eval   PATIENT EDUCATION:  Education details: PT POC, exam findings, initial HEP Person educated: Patient Education method: Explanation and Handouts Education comprehension: verbalized understanding  HOME EXERCISE PROGRAM: Access Code: [email or web address] URL: [email or web address]  Date: 08/23/2022 Prepared by: Merry Lofty  Exercises - Seated Hamstring Stretch  - 1 x daily - 7 x weekly - 3 sets - 30-45s hold - Supine Double Knee to Chest  - 1 x daily - 7 x  weekly - 3 sets - 30-45s hold - Seated Slump Nerve Glide  - 1 x daily - 7 x weekly - 3 sets - 10 reps  ASSESSMENT:  CLINICAL IMPRESSION: Patient is a 72 y.o. male who was seen today for physical therapy evaluation and treatment for low back pain. Patient with long history of low back with recent exacerbation. He does have persistent R distal lateral lower extremity numbness. No significant recreation of pain on eval, but patient does report "tightness" in B hamstrings. He was able to touch his toes from a standing position with minimal knee flexion. He scored a 7 on the Oswestry indicating mild disability due to LBP. He would benefit from skilled PT services to address the above mentioned deficits.    OBJECTIVE IMPAIRMENTS: decreased knowledge of condition, decreased mobility, increased fascial restrictions, impaired sensation, postural dysfunction, and pain.   ACTIVITY LIMITATIONS: carrying, lifting, squatting, dressing, and caring for others  PARTICIPATION LIMITATIONS: interpersonal relationship, community activity, and yard work  PERSONAL FACTORS: Fitness, Time since onset of injury/illness/exacerbation, and 1-2 comorbidities: see below  are also affecting patient's functional outcome.   REHAB POTENTIAL: Fair time since onset  CLINICAL DECISION MAKING: Stable/uncomplicated  EVALUATION COMPLEXITY: Low   GOALS: Goals reviewed with patient? Yes  SHORT TERM GOALS: Target date: 09/15/22  Pt will be independent with initial HEP for improved symptom report  Baseline: to be updated Goal status: INITIAL   LONG TERM GOALS: Target date: 10/06/22  Pt will be independent with final HEP for improved symptom report  Baseline: to be updated  Goal status: INITIAL  2.  Patient will improve Oswestry score to </= 4 to demonstrate no disability due to back pain Baseline: 7 Goal status: INITIAL  PLAN:  PT FREQUENCY: 1x/week  PT DURATION: 6 weeks  PLANNED INTERVENTIONS: Therapeutic  exercises, Therapeutic activity, Neuromuscular re-education, Balance training, Gait training, Patient/Family education, Self Care, Joint mobilization, Stair training, Vestibular training, DME instructions, Aquatic Therapy, Dry Needling, Electrical stimulation, Spinal mobilization, Cryotherapy, Moist heat, Taping, Manual therapy, and Re-evaluation.  PLAN FOR NEXT SESSION: HEP for hip/core strength   Westley Foots, PT, DPT, CBIS 08/23/2022, 12:19 PM

## 2022-09-01 ENCOUNTER — Ambulatory Visit (INDEPENDENT_AMBULATORY_CARE_PROVIDER_SITE_OTHER): Payer: Medicare HMO

## 2022-09-01 DIAGNOSIS — E538 Deficiency of other specified B group vitamins: Secondary | ICD-10-CM

## 2022-09-01 MED ORDER — CYANOCOBALAMIN 1000 MCG/ML IJ SOLN
1000.0000 ug | Freq: Once | INTRAMUSCULAR | Status: AC
  Administered 2022-09-01: 1000 ug via INTRAMUSCULAR

## 2022-09-01 NOTE — Progress Notes (Signed)
B12 given.  Pt tolerated well. Pt is aware to give the office a call for an side effects or reactions. Please co-sign.   

## 2022-09-07 ENCOUNTER — Ambulatory Visit: Payer: Medicare HMO | Attending: Physical Medicine and Rehabilitation

## 2022-09-07 DIAGNOSIS — R293 Abnormal posture: Secondary | ICD-10-CM | POA: Insufficient documentation

## 2022-09-07 DIAGNOSIS — R2689 Other abnormalities of gait and mobility: Secondary | ICD-10-CM | POA: Diagnosis present

## 2022-09-07 DIAGNOSIS — M6281 Muscle weakness (generalized): Secondary | ICD-10-CM | POA: Insufficient documentation

## 2022-09-07 NOTE — Therapy (Signed)
OUTPATIENT PHYSICAL THERAPY THORACOLUMBAR TREATMENT   Patient Name: Andrew Stuart MRN: [id number] DOB:01/04/1951, 72 y.o., male Today's Date: 09/07/2022  END OF SESSION:  PT End of Session - 09/07/22 1407     Visit Number 2    Number of Visits 7    Date for PT Re-Evaluation 10/06/22    Authorization Type [employer]    PT Start Time 1407   patient in bathroom   PT Stop Time 1448    PT Time Calculation (min) 41 min    Activity Tolerance Patient tolerated treatment well    Behavior During Therapy [facility] for tasks assessed/performed             Past Medical History:  Diagnosis Date   Allergy    Arthritis    Hepatitis    Hyperlipidemia    Past Surgical History:  Procedure Laterality Date   CARPAL TUNNEL RELEASE Right 2015   ENDOVENOUS ABLATION SAPHENOUS VEIN W/ LASER Left 04/12/2022   endovenous laser ablation left greater saphenous vein and stab phlebectomy < 10 incisions left leg by Cari Caraway MD   TONSILLECTOMY     Patient Active Problem List   Diagnosis Date Noted   Facet arthropathy, lumbar 08/16/2022   Hamstring tightness of both lower extremities 08/16/2022   Right lumbar radiculitis 07/05/2022   Chronic right-sided low back pain with right-sided sciatica 07/05/2022   Chronic hand pain, left 03/09/2022   Primary hypertension 03/09/2022   Encounter for general adult medical examination with abnormal findings 01/04/2022   Idiopathic progressive neuropathy 10/06/2021   Hyperglycemia 10/06/2021   Varicose veins of both lower extremities with pain 10/06/2021   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   Abnormal electrocardiogram (ECG) (EKG) 03/01/2021   Vitamin B12 deficiency 02/18/2021   Chronic venous insufficiency 02/17/2021   Hyperlipidemia with target LDL less than 130 08/26/2020   BPH without obstruction/lower urinary tract symptoms 08/26/2020   Carpal tunnel syndrome on left 04/28/2020   Nephrolithiasis 03/02/2015    PCP:  Sanda Linger, MD  REFERRING PROVIDER: Angelina Sheriff, DO  REFERRING DIAG: [phone] (ICD-10-CM) - Chronic right-sided low back pain with right-sided sciatica M47.816 (ICD-10-CM) - Facet arthropathy, lumbar  Rationale for Evaluation and Treatment: Rehabilitation  THERAPY DIAG:  Abnormal posture  Muscle weakness (generalized)  Other abnormalities of gait and mobility  ONSET DATE: 08/16/2022 referral  SUBJECTIVE:                                                                                                                                                                                           SUBJECTIVE STATEMENT: Patient reports doing well.  Denies falls/near falls. Reports an average pain of 3/10 since last appt. Has been walking quite a bit and will report some stiffness afterward, but no significant pain.   PERTINENT HISTORY:  Back injury August 2023, hepatitis  PAIN:  Are you having pain? No  PRECAUTIONS: Fall  PATIENT GOALS: "to get back close to where I was; to get back to the gym"  OBJECTIVE:   DIAGNOSTIC FINDINGS:  Lumbar xray 01/2021: IMPRESSION: Advanced spondylosis, without acute osseous abnormality.   [facility] MRI 03/2021: IMPRESSION: 1. No acute process or suspicious mass identified in the pelvis. 2. Prostatomegaly. 3. Mild colonic diverticulosis. 4. 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   Insurance denied lumbar MRI  TODAY'S TREATMENT:                                                                                                                              -scifit hills level 3 x10 mins B LE only for improved strength  -HEP (see below)    PATIENT EDUCATION:  Education details: HEP Person educated: Patient Education method: Chief Technology Officer Education comprehension: verbalized understanding  HOME EXERCISE PROGRAM: Access Code: [email or web address] URL:  [email or web address] Date: 09/07/2022 Prepared by: Merry Lofty  Exercises - Supine Lower Trunk Rotation  - 1 x daily - 7 x weekly - 3 sets - 10 reps - Supine Single Knee to Chest Stretch  - 1 x daily - 7 x weekly - 3 sets - 30s hold - Leg Overs  - 1 x daily - 7 x weekly - 3 sets - 30s hold - Bird Dog  - 1 x daily - 7 x weekly - 3 sets - 10 reps - Prone Press Up  - 1 x daily - 7 x weekly - 3 sets - 10 reps - 2-3s hold - Plank with Thoracic Rotation on Counter  - 1 x daily - 7 x weekly - 3 sets - 10 reps - Side Stepping with Resistance at Ankles  - 1 x daily - 7 x weekly - 3 sets - 10 reps  ASSESSMENT:  CLINICAL IMPRESSION: Patient seen for skilled PT session with emphasis on establishing HEP. He notes slight tension in R hamstring > L hamstring, but manageable and no overt pain. Demonstrates decreased thoracolumbar rotation. Continue POC.   OBJECTIVE IMPAIRMENTS: decreased knowledge of condition, decreased mobility, increased fascial restrictions, impaired sensation, postural dysfunction, and pain.   ACTIVITY LIMITATIONS: carrying, lifting, squatting, dressing, and caring for others  PARTICIPATION LIMITATIONS: interpersonal relationship, community activity, and yard work  PERSONAL FACTORS: Fitness, Time since onset of injury/illness/exacerbation, and 1-2 comorbidities: see below  are also affecting patient's functional outcome.   REHAB POTENTIAL: Fair time since onset  CLINICAL DECISION MAKING: Stable/uncomplicated  EVALUATION COMPLEXITY: Low   GOALS: Goals reviewed with patient? Yes  SHORT TERM GOALS: Target date: 09/15/22  Pt will be independent with initial HEP for improved  symptom report  Baseline: to be updated Goal status: INITIAL   LONG TERM GOALS: Target date: 10/06/22  Pt will be independent with final HEP for improved symptom report  Baseline: to be updated  Goal status: INITIAL  2.  Patient will improve Oswestry score to </= 4 to demonstrate  no disability due to back pain Baseline: 7 Goal status: INITIAL  PLAN:  PT FREQUENCY: 1x/week  PT DURATION: 6 weeks  PLANNED INTERVENTIONS: Therapeutic exercises, Therapeutic activity, Neuromuscular re-education, Balance training, Gait training, Patient/Family education, Self Care, Joint mobilization, Stair training, Vestibular training, DME instructions, Aquatic Therapy, Dry Needling, Electrical stimulation, Spinal mobilization, Cryotherapy, Moist heat, Taping, Manual therapy, and Re-evaluation.  PLAN FOR NEXT SESSION: hip/core strength  Westley Foots, PT, DPT, CBIS 09/07/2022, 2:56 PM

## 2022-09-14 ENCOUNTER — Ambulatory Visit: Payer: Medicare HMO

## 2022-09-14 DIAGNOSIS — R293 Abnormal posture: Secondary | ICD-10-CM

## 2022-09-14 DIAGNOSIS — M6281 Muscle weakness (generalized): Secondary | ICD-10-CM

## 2022-09-14 DIAGNOSIS — R2689 Other abnormalities of gait and mobility: Secondary | ICD-10-CM

## 2022-09-14 NOTE — Therapy (Signed)
OUTPATIENT PHYSICAL THERAPY THORACOLUMBAR TREATMENT   Patient Name: Andrew Stuart MRN: [id number] DOB:08-12-1950, 72 y.o., male Today's Date: 09/14/2022  END OF SESSION:  PT End of Session - 09/14/22 1347     Visit Number 3    Number of Visits 7    Date for PT Re-Evaluation 10/06/22    Authorization Type [employer] Medicare    PT Start Time 1345   agreeable to start early   PT Stop Time 1430    PT Time Calculation (min) 45 min    Activity Tolerance Patient tolerated treatment well    Behavior During Therapy [facility] for tasks assessed/performed             Past Medical History:  Diagnosis Date   Allergy    Arthritis    Hepatitis    Hyperlipidemia    Past Surgical History:  Procedure Laterality Date   CARPAL TUNNEL RELEASE Right 2015   ENDOVENOUS ABLATION SAPHENOUS VEIN W/ LASER Left 04/12/2022   endovenous laser ablation left greater saphenous vein and stab phlebectomy < 10 incisions left leg by Cari Caraway MD   TONSILLECTOMY     Patient Active Problem List   Diagnosis Date Noted   Facet arthropathy, lumbar 08/16/2022   Hamstring tightness of both lower extremities 08/16/2022   Right lumbar radiculitis 07/05/2022   Chronic right-sided low back pain with right-sided sciatica 07/05/2022   Chronic hand pain, left 03/09/2022   Primary hypertension 03/09/2022   Encounter for general adult medical examination with abnormal findings 01/04/2022   Idiopathic progressive neuropathy 10/06/2021   Hyperglycemia 10/06/2021   Varicose veins of both lower extremities with pain 10/06/2021   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   Abnormal electrocardiogram (ECG) (EKG) 03/01/2021   Vitamin B12 deficiency 02/18/2021   Chronic venous insufficiency 02/17/2021   Hyperlipidemia with target LDL less than 130 08/26/2020   BPH without obstruction/lower urinary tract symptoms 08/26/2020   Carpal tunnel syndrome on left 04/28/2020   Nephrolithiasis 03/02/2015     PCP: Sanda Linger, MD  REFERRING PROVIDER: Angelina Sheriff, DO  REFERRING DIAG: [phone] (ICD-10-CM) - Chronic right-sided low back pain with right-sided sciatica M47.816 (ICD-10-CM) - Facet arthropathy, lumbar  Rationale for Evaluation and Treatment: Rehabilitation  THERAPY DIAG:  Abnormal posture  Muscle weakness (generalized)  Other abnormalities of gait and mobility  ONSET DATE: 08/16/2022 referral  SUBJECTIVE:                                                                                                                                                                                           SUBJECTIVE STATEMENT: Patient reports doing  well. Continues to have 2-3/10 soreness/stiffness. Bird/dog is challenging because of his shoulder, but otherwise good. Denies falls/near falls.   PERTINENT HISTORY:  Back injury August 2023, hepatitis  PAIN:  Are you having pain? No  PRECAUTIONS: Fall  PATIENT GOALS: "to get back close to where I was; to get back to the gym"  OBJECTIVE:   DIAGNOSTIC FINDINGS:  Lumbar xray 01/2021: IMPRESSION: Advanced spondylosis, without acute osseous abnormality.   [facility] MRI 03/2021: IMPRESSION: 1. No acute process or suspicious mass identified in the pelvis. 2. Prostatomegaly. 3. Mild colonic diverticulosis. 4. 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   Insurance denied lumbar MRI  TODAY'S TREATMENT:                                                                                                                              -nustep level 5 B LE only  - single LE biased lateral lunge with 5# kettlebell (4" step)  -single LE biased deadlift with 5# kettlebell (4" step)  -anterior lunge on bosu ball-> added gentle trunk twist with 5# kettlebell  -resisted trunk rotation with black theraband  -SKTC -gentle lower trunk twist supine    PATIENT EDUCATION:   Education details: continue HEP Person educated: Patient Education method: Explanation and Handouts Education comprehension: verbalized understanding  HOME EXERCISE PROGRAM: Access Code: [email or web address] URL: [email or web address] Date: 09/07/2022 Prepared by: Merry Lofty  Exercises - Supine Lower Trunk Rotation  - 1 x daily - 7 x weekly - 3 sets - 10 reps - Supine Single Knee to Chest Stretch  - 1 x daily - 7 x weekly - 3 sets - 30s hold - Leg Overs  - 1 x daily - 7 x weekly - 3 sets - 30s hold - Bird Dog  - 1 x daily - 7 x weekly - 3 sets - 10 reps - Prone Press Up  - 1 x daily - 7 x weekly - 3 sets - 10 reps - 2-3s hold - Plank with Thoracic Rotation on Counter  - 1 x daily - 7 x weekly - 3 sets - 10 reps - Side Stepping with Resistance at Ankles  - 1 x daily - 7 x weekly - 3 sets - 10 reps  ASSESSMENT:  CLINICAL IMPRESSION: Patient seen for skilled PT session with emphasis on progressing therex. Tolerating added resistance and challenge of exercises well with minimal increase in low back pain. Encouraged to end HEP with stretches- patient verbalized understanding. Continue POC.   OBJECTIVE IMPAIRMENTS: decreased knowledge of condition, decreased mobility, increased fascial restrictions, impaired sensation, postural dysfunction, and pain.   ACTIVITY LIMITATIONS: carrying, lifting, squatting, dressing, and caring for others  PARTICIPATION LIMITATIONS: interpersonal relationship, community activity, and yard work  PERSONAL FACTORS: Fitness, Time since onset of injury/illness/exacerbation, and 1-2 comorbidities: see below  are also affecting patient's functional outcome.   REHAB POTENTIAL: Fair time since  onset  CLINICAL DECISION MAKING: Stable/uncomplicated  EVALUATION COMPLEXITY: Low   GOALS: Goals reviewed with patient? Yes  SHORT TERM GOALS: Target date: 09/15/22  Pt will be independent with initial HEP for improved symptom report  Baseline: to be updated;  provided Goal status: MET   LONG TERM GOALS: Target date: 10/06/22  Pt will be independent with final HEP for improved symptom report  Baseline: to be updated  Goal status: INITIAL  2.  Patient will improve Oswestry score to </= 4 to demonstrate no disability due to back pain Baseline: 7 Goal status: INITIAL  PLAN:  PT FREQUENCY: 1x/week  PT DURATION: 6 weeks  PLANNED INTERVENTIONS: Therapeutic exercises, Therapeutic activity, Neuromuscular re-education, Balance training, Gait training, Patient/Family education, Self Care, Joint mobilization, Stair training, Vestibular training, DME instructions, Aquatic Therapy, Dry Needling, Electrical stimulation, Spinal mobilization, Cryotherapy, Moist heat, Taping, Manual therapy, and Re-evaluation.  PLAN FOR NEXT SESSION: hip/core strength  Westley Foots, PT, DPT, CBIS 09/14/2022, 2:31 PM

## 2022-09-21 ENCOUNTER — Ambulatory Visit: Payer: Medicare HMO | Admitting: Physical Therapy

## 2022-09-21 DIAGNOSIS — R293 Abnormal posture: Secondary | ICD-10-CM

## 2022-09-21 DIAGNOSIS — R2689 Other abnormalities of gait and mobility: Secondary | ICD-10-CM

## 2022-09-21 DIAGNOSIS — M6281 Muscle weakness (generalized): Secondary | ICD-10-CM

## 2022-09-21 NOTE — Therapy (Signed)
OUTPATIENT PHYSICAL THERAPY THORACOLUMBAR TREATMENT   Patient Name: Andrew Stuart MRN: [id number] DOB:1950/05/26, 72 y.o., male Today's Date: 09/21/2022  END OF SESSION:  PT End of Session - 09/21/22 1314     Visit Number 4    Number of Visits 7    Date for PT Re-Evaluation 10/06/22    Authorization Type [employer] Medicare    PT Start Time 1311    PT Stop Time 1400    PT Time Calculation (min) 49 min    Activity Tolerance Patient tolerated treatment well    Behavior During Therapy [facility] for tasks assessed/performed              Past Medical History:  Diagnosis Date   Allergy    Arthritis    Hepatitis    Hyperlipidemia    Past Surgical History:  Procedure Laterality Date   CARPAL TUNNEL RELEASE Right 2015   ENDOVENOUS ABLATION SAPHENOUS VEIN W/ LASER Left 04/12/2022   endovenous laser ablation left greater saphenous vein and stab phlebectomy < 10 incisions left leg by Cari Caraway MD   TONSILLECTOMY     Patient Active Problem List   Diagnosis Date Noted   Facet arthropathy, lumbar 08/16/2022   Hamstring tightness of both lower extremities 08/16/2022   Right lumbar radiculitis 07/05/2022   Chronic right-sided low back pain with right-sided sciatica 07/05/2022   Chronic hand pain, left 03/09/2022   Primary hypertension 03/09/2022   Encounter for general adult medical examination with abnormal findings 01/04/2022   Idiopathic progressive neuropathy 10/06/2021   Hyperglycemia 10/06/2021   Varicose veins of both lower extremities with pain 10/06/2021   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   Abnormal electrocardiogram (ECG) (EKG) 03/01/2021   Vitamin B12 deficiency 02/18/2021   Chronic venous insufficiency 02/17/2021   Hyperlipidemia with target LDL less than 130 08/26/2020   BPH without obstruction/lower urinary tract symptoms 08/26/2020   Carpal tunnel syndrome on left 04/28/2020   Nephrolithiasis 03/02/2015    PCP: Sanda Linger,  MD  REFERRING PROVIDER: Angelina Sheriff, DO  REFERRING DIAG: [phone] (ICD-10-CM) - Chronic right-sided low back pain with right-sided sciatica M47.816 (ICD-10-CM) - Facet arthropathy, lumbar  Rationale for Evaluation and Treatment: Rehabilitation  THERAPY DIAG:  Abnormal posture  Muscle weakness (generalized)  Other abnormalities of gait and mobility  ONSET DATE: 08/16/2022 referral  SUBJECTIVE:                                                                                                                                                                                           SUBJECTIVE STATEMENT: Pt reports some stiffness in his low  back and hips. He was able to change a lightbulb standing on a ladder this morning, went up with his LLE first as this is his stronger leg. Pt has a nerve test with Dr. Shearon Stalls next Wednesday. Pt reports he tries to walk about 2 miles every other day.  PERTINENT HISTORY:  Back injury August 2023, hepatitis  PAIN:  Are you having pain? No  PRECAUTIONS: Fall  PATIENT GOALS: "to get back close to where I was; to get back to the gym"  OBJECTIVE:   DIAGNOSTIC FINDINGS:  Lumbar xray 01/2021: IMPRESSION: Advanced spondylosis, without acute osseous abnormality.   [facility] MRI 03/2021: IMPRESSION: 1. No acute process or suspicious mass identified in the pelvis. 2. Prostatomegaly. 3. Mild colonic diverticulosis. 4. 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   Insurance denied lumbar MRI  TODAY'S TREATMENT:                                                                                                                              TherEx: SciFit multi-peaks level 4 for 8 minutes using BUE/BLEs for neural priming for reciprocal movement, dynamic cardiovascular warmup and increased amplitude of stepping.    For hip strengthening resisted with GTB: Forwards/backwards  monster walk with GTB 4 x 10 ft no UE support Resisted 6" and 12" step taps with GTB 2 x 10 reps each   Seated on green Swiss ball for core strengthening and stability: Alt L/R marches 2 x 15 reps with TA contract Alt L/R LAQ 2 x 15 reps with TA contract 5# weighted dowel punch-outs 3 x 15 reps L/R "paddling" with 5# weighted dowel 3 x 15 reps  Added to HEP, see bolded below   PATIENT EDUCATION:  Education details: continue HEP, added to HEP Person educated: Patient Education method: Explanation and Handouts Education comprehension: verbalized understanding  HOME EXERCISE PROGRAM: Access Code: [email or web address] URL: [email or web address] Date: 09/07/2022 Prepared by: Merry Lofty  Exercises - Supine Lower Trunk Rotation  - 1 x daily - 7 x weekly - 3 sets - 10 reps - Supine Single Knee to Chest Stretch  - 1 x daily - 7 x weekly - 3 sets - 30s hold - Leg Overs  - 1 x daily - 7 x weekly - 3 sets - 30s hold - Bird Dog  - 1 x daily - 7 x weekly - 3 sets - 10 reps - Prone Press Up  - 1 x daily - 7 x weekly - 3 sets - 10 reps - 2-3s hold - Plank with Thoracic Rotation on Counter  - 1 x daily - 7 x weekly - 3 sets - 10 reps - Side Stepping with Resistance at Ankles  - 1 x daily - 7 x weekly - 3 sets - 10 reps - Forward Monster Walks  - 1 x daily - 7 x weekly - 3 sets - 10 reps -  Swiss Ball March  - 1 x daily - 7 x weekly - 3 sets - 10 reps - Seated Long Arc Quad  - 1 x daily - 7 x weekly - 3 sets - 10 reps - Seated Chest Press with Bar  - 1 x daily - 7 x weekly - 3 sets - 10 reps   ASSESSMENT:  CLINICAL IMPRESSION: Patient seen for skilled PT session on continuing to work on core strengthening and stability in order to improve management of pain symptoms. Pt with good tolerance for treatment this date, does have some onset of RLE pain but reports this is usual for him during physical activity. Pt reiterates his goal of returning to fitness at community level as he enjoyed  going to the gym prior to this injury. Continue POC.   OBJECTIVE IMPAIRMENTS: decreased knowledge of condition, decreased mobility, increased fascial restrictions, impaired sensation, postural dysfunction, and pain.   ACTIVITY LIMITATIONS: carrying, lifting, squatting, dressing, and caring for others  PARTICIPATION LIMITATIONS: interpersonal relationship, community activity, and yard work  PERSONAL FACTORS: Fitness, Time since onset of injury/illness/exacerbation, and 1-2 comorbidities: see below  are also affecting patient's functional outcome.   REHAB POTENTIAL: Fair time since onset  CLINICAL DECISION MAKING: Stable/uncomplicated  EVALUATION COMPLEXITY: Low   GOALS: Goals reviewed with patient? Yes  SHORT TERM GOALS: Target date: 09/15/22  Pt will be independent with initial HEP for improved symptom report  Baseline: to be updated; provided Goal status: MET   LONG TERM GOALS: Target date: 10/06/22  Pt will be independent with final HEP for improved symptom report  Baseline: to be updated  Goal status: INITIAL  2.  Patient will improve Oswestry score to </= 4 to demonstrate no disability due to back pain Baseline: 7 Goal status: INITIAL  PLAN:  PT FREQUENCY: 1x/week  PT DURATION: 6 weeks  PLANNED INTERVENTIONS: Therapeutic exercises, Therapeutic activity, Neuromuscular re-education, Balance training, Gait training, Patient/Family education, Self Care, Joint mobilization, Stair training, Vestibular training, DME instructions, Aquatic Therapy, Dry Needling, Electrical stimulation, Spinal mobilization, Cryotherapy, Moist heat, Taping, Manual therapy, and Re-evaluation.  PLAN FOR NEXT SESSION: hip/core strength  Peter Congo, PT, DPT, CSRS 09/21/2022, 2:00 PM

## 2022-09-27 ENCOUNTER — Encounter: Payer: Medicare HMO | Attending: Physical Medicine and Rehabilitation | Admitting: Physical Medicine and Rehabilitation

## 2022-09-27 VITALS — BP 128/81 | HR 62 | Ht 69.0 in | Wt 173.0 lb

## 2022-09-27 DIAGNOSIS — M5441 Lumbago with sciatica, right side: Secondary | ICD-10-CM | POA: Insufficient documentation

## 2022-09-27 DIAGNOSIS — G8929 Other chronic pain: Secondary | ICD-10-CM | POA: Diagnosis not present

## 2022-09-28 ENCOUNTER — Ambulatory Visit: Payer: Medicare HMO

## 2022-09-29 NOTE — Progress Notes (Signed)
Will scan results/fax in 2-3 business days

## 2022-10-04 ENCOUNTER — Ambulatory Visit (INDEPENDENT_AMBULATORY_CARE_PROVIDER_SITE_OTHER): Payer: Medicare HMO | Admitting: *Deleted

## 2022-10-04 DIAGNOSIS — E538 Deficiency of other specified B group vitamins: Secondary | ICD-10-CM

## 2022-10-04 MED ORDER — CYANOCOBALAMIN 1000 MCG/ML IJ SOLN
1000.0000 ug | Freq: Once | INTRAMUSCULAR | Status: AC
  Administered 2022-10-04: 1000 ug via INTRAMUSCULAR

## 2022-10-04 NOTE — Progress Notes (Signed)
Pls cosign for B12 inj../lmb  

## 2022-10-05 ENCOUNTER — Ambulatory Visit: Payer: Medicare HMO

## 2022-10-06 ENCOUNTER — Ambulatory Visit: Payer: Medicare HMO | Attending: Physical Medicine and Rehabilitation | Admitting: Physical Therapy

## 2022-10-06 DIAGNOSIS — R293 Abnormal posture: Secondary | ICD-10-CM | POA: Diagnosis present

## 2022-10-06 DIAGNOSIS — R2689 Other abnormalities of gait and mobility: Secondary | ICD-10-CM | POA: Insufficient documentation

## 2022-10-06 DIAGNOSIS — M6281 Muscle weakness (generalized): Secondary | ICD-10-CM | POA: Diagnosis present

## 2022-10-06 NOTE — Therapy (Signed)
OUTPATIENT PHYSICAL THERAPY THORACOLUMBAR TREATMENT - RECERTIFICATION   Patient Name: ABHIMANYU CRUCES MRN: [id number] DOB:1951-03-04, 72 y.o., male Today's Date: 10/06/2022  END OF SESSION:  PT End of Session - 10/06/22 1108     Visit Number 5    Number of Visits 7    Date for PT Re-Evaluation 11/03/22   recert   Authorization Type [employer]    PT Start Time 1104   pt in restroom   PT Stop Time 1142    PT Time Calculation (min) 38 min    Activity Tolerance Patient tolerated treatment well    Behavior During Therapy [facility] for tasks assessed/performed               Past Medical History:  Diagnosis Date   Allergy    Arthritis    Hepatitis    Hyperlipidemia    Past Surgical History:  Procedure Laterality Date   CARPAL TUNNEL RELEASE Right 2015   ENDOVENOUS ABLATION SAPHENOUS VEIN W/ LASER Left 04/12/2022   endovenous laser ablation left greater saphenous vein and stab phlebectomy < 10 incisions left leg by Cari Caraway MD   TONSILLECTOMY     Patient Active Problem List   Diagnosis Date Noted   Facet arthropathy, lumbar 08/16/2022   Hamstring tightness of both lower extremities 08/16/2022   Right lumbar radiculitis 07/05/2022   Chronic right-sided low back pain with right-sided sciatica 07/05/2022   Chronic hand pain, left 03/09/2022   Primary hypertension 03/09/2022   Encounter for general adult medical examination with abnormal findings 01/04/2022   Idiopathic progressive neuropathy 10/06/2021   Hyperglycemia 10/06/2021   Varicose veins of both lower extremities with pain 10/06/2021   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   Abnormal electrocardiogram (ECG) (EKG) 03/01/2021   Vitamin B12 deficiency 02/18/2021   Chronic venous insufficiency 02/17/2021   Hyperlipidemia with target LDL less than 130 08/26/2020   BPH without obstruction/lower urinary tract symptoms 08/26/2020   Carpal tunnel syndrome on left 04/28/2020   Nephrolithiasis  03/02/2015    PCP: Sanda Linger, MD  REFERRING PROVIDER: Angelina Sheriff, DO  REFERRING DIAG: [phone] (ICD-10-CM) - Chronic right-sided low back pain with right-sided sciatica M47.816 (ICD-10-CM) - Facet arthropathy, lumbar  Rationale for Evaluation and Treatment: Rehabilitation  THERAPY DIAG:  Abnormal posture  Muscle weakness (generalized)  Other abnormalities of gait and mobility  ONSET DATE: 08/16/2022 referral  SUBJECTIVE:  SUBJECTIVE STATEMENT: Pt reports that he had his nerve conduction study done last week, still waiting on results but Dr. Shearon Stalls did tell him he has a pinched nerve. Pt sees Dr. Shearon Stalls again 6/24. Pt with some back stiffness today but no complaints of pain. Pt reports he only feels the pain from his R sciatic nerve intermittently. Pt does have continuing numbness down his RLE into his foot but does report overall the sciatic pain and numbness has improved from when his symptoms started. Pt reports he continues to walk 2-3 miles every other and then does his HEP on the days he doesn't work on walking. Pt reports that his exercises are going well, continues to have a goal of returning to exercise at the gym.  PERTINENT HISTORY:  Back injury August 2023, hepatitis  PAIN:  Are you having pain? No  PRECAUTIONS: Fall  PATIENT GOALS: "to get back close to where I was; to get back to the gym"  OBJECTIVE:   DIAGNOSTIC FINDINGS:  Lumbar xray 01/2021: IMPRESSION: Advanced spondylosis, without acute osseous abnormality.   [facility] MRI 03/2021: IMPRESSION: 1. No acute process or suspicious mass identified in the pelvis. 2. Prostatomegaly. 3. Mild colonic diverticulosis. 4. Tendinosis of the bilateral proximal hamstring tendons which is moderate to severe on the left with  partial tearing.   Insurance denied lumbar MRI  TODAY'S TREATMENT:                                                                                                                               TherEx: Seated piriformis stretch 3 x 30 sec each Supine piriformis stretch 3 x 30 sec each Supine bridges x 10 reps with 5 sec hold Supine SK cross body stretch 3 x 30 sec each Childs pose 3 x 30 sec each  Added appropriate exercises to HEP, see bolded below   TherAct: For goal assessment: Oswestry: 5/50  PATIENT EDUCATION:  Education details: continue HEP, added to HEP, PT POC Person educated: Patient Education method: Explanation and Handouts Education comprehension: verbalized understanding  HOME EXERCISE PROGRAM: Access Code: [email or web address] URL: [email or web address] Date: 09/07/2022 Prepared by: Merry Lofty  Exercises - Supine Lower Trunk Rotation  - 1 x daily - 7 x weekly - 3 sets - 10 reps - Supine Single Knee to Chest Stretch  - 1 x daily - 7 x weekly - 3 sets - 30s hold - Leg Overs  - 1 x daily - 7 x weekly - 3 sets - 30s hold - Bird Dog  - 1 x daily - 7 x weekly - 3 sets - 10 reps - Prone Press Up  - 1 x daily - 7 x weekly - 3 sets - 10 reps - 2-3s hold - Plank with Thoracic Rotation on Counter  - 1 x daily - 7 x weekly - 3 sets - 10 reps - Side Stepping with Resistance at Ankles  - 1 x daily - 7 x  weekly - 3 sets - 10 reps - Forward Monster Walks  - 1 x daily - 7 x weekly - 3 sets - 10 reps - Swiss Ball March  - 1 x daily - 7 x weekly - 3 sets - 10 reps - Seated Long Arc Quad  - 1 x daily - 7 x weekly - 3 sets - 10 reps - Seated Chest Press with Bar  - 1 x daily - 7 x weekly - 3 sets - 10 reps - Seated Piriformis Stretch with Trunk Bend  - 1 x daily - 7 x weekly - 1 sets - 3-5 reps - 30 sec hold - Supine Piriformis Stretch with Leg Straight  - 1 x daily - 7 x weekly - 1 sets - 3-5 reps - 30 sec hold - Child's Pose Stretch  - 1 x daily - 7 x weekly - 1 sets -  5 reps - 30 sec hold   ASSESSMENT:  CLINICAL IMPRESSION: Emphasis of skilled PT session on assessing LTG and creating new LTG for recertification of PT services. Pt demonstrates improved management of pain symptoms with decrease in score on the Oswestry from 7/50 to 5/50, did not quite meet his goal of 4/50. Additionally, pt is independent with his current HEP but HEP has not yet been finalized so he is making progress towards 2/2 LTG. Pt continues to benefit from skilled therapy services to work towards increasing independence with management of his pain symptoms. Continue POC.   OBJECTIVE IMPAIRMENTS: decreased knowledge of condition, decreased mobility, increased fascial restrictions, impaired sensation, postural dysfunction, and pain.   ACTIVITY LIMITATIONS: carrying, lifting, squatting, dressing, and caring for others  PARTICIPATION LIMITATIONS: interpersonal relationship, community activity, and yard work  PERSONAL FACTORS: Fitness, Time since onset of injury/illness/exacerbation, and 1-2 comorbidities: see below  are also affecting patient's functional outcome.   REHAB POTENTIAL: Fair time since onset  CLINICAL DECISION MAKING: Stable/uncomplicated  EVALUATION COMPLEXITY: Low   GOALS: Goals reviewed with patient? Yes  SHORT TERM GOALS: Target date: 09/15/22  Pt will be independent with initial HEP for improved symptom report  Baseline: to be updated; provided Goal status: MET   LONG TERM GOALS: Target date: 10/06/22  Pt will be independent with final HEP for improved symptom report  Baseline: to be updated, HEP in progress but not finalized yet (6/7) Goal status: IN PROGRESS  2.  Patient will improve Oswestry score to </= 4 to demonstrate no disability due to back pain Baseline: 7, 5/50 (6/7) Goal status: IN PROGRESS  NEW SHORT TERM GOALS=LONG TERM GOALS due to length of POC   NEW LONG TERM GOALS:  Target date: 11/03/2022  Pt will be independent with final HEP for  improved strength, balance, transfers and gait. Baseline: HEP in progress but not finalized yet (6/7) Goal status: IN PROGRESS  2.  Patient will improve Oswestry score to </= 4 to demonstrate no disability due to back pain Baseline:  7, 5/50 (6/7) Goal status: INITIAL    PLAN:  PT FREQUENCY: 1x/week  PT DURATION: 6 weeks + 4 weeks (recert)  PLANNED INTERVENTIONS: Therapeutic exercises, Therapeutic activity, Neuromuscular re-education, Balance training, Gait training, Patient/Family education, Self Care, Joint mobilization, Stair training, Vestibular training, DME instructions, Aquatic Therapy, Dry Needling, Electrical stimulation, Spinal mobilization, Cryotherapy, Moist heat, Taping, Manual therapy, and Re-evaluation.  PLAN FOR NEXT SESSION: hip/core strength, sciatic nerve glides, weighted ball punch-outs  Peter Congo, PT, DPT, CSRS 10/06/2022, 12:11 PM

## 2022-10-11 ENCOUNTER — Encounter: Payer: Self-pay | Admitting: Physical Medicine and Rehabilitation

## 2022-10-11 ENCOUNTER — Ambulatory Visit: Payer: Medicare HMO | Admitting: Physical Therapy

## 2022-10-11 DIAGNOSIS — R293 Abnormal posture: Secondary | ICD-10-CM

## 2022-10-11 DIAGNOSIS — M6281 Muscle weakness (generalized): Secondary | ICD-10-CM

## 2022-10-11 DIAGNOSIS — R2689 Other abnormalities of gait and mobility: Secondary | ICD-10-CM

## 2022-10-11 NOTE — Therapy (Signed)
OUTPATIENT PHYSICAL THERAPY THORACOLUMBAR TREATMENT   Patient Name: Andrew Stuart MRN: [id number] DOB:10/09/1950, 72 y.o., male Today's Date: 10/11/2022  END OF SESSION:  PT End of Session - 10/11/22 1101     Visit Number 6    Number of Visits 7    Date for PT Re-Evaluation 11/03/22   recert   Authorization Type [employer]    PT Start Time 1100    PT Stop Time 1142    PT Time Calculation (min) 42 min    Activity Tolerance Patient tolerated treatment well    Behavior During Therapy [facility] for tasks assessed/performed                Past Medical History:  Diagnosis Date   Allergy    Arthritis    Hepatitis    Hyperlipidemia    Past Surgical History:  Procedure Laterality Date   CARPAL TUNNEL RELEASE Right 2015   ENDOVENOUS ABLATION SAPHENOUS VEIN W/ LASER Left 04/12/2022   endovenous laser ablation left greater saphenous vein and stab phlebectomy < 10 incisions left leg by Cari Caraway MD   TONSILLECTOMY     Patient Active Problem List   Diagnosis Date Noted   Facet arthropathy, lumbar 08/16/2022   Hamstring tightness of both lower extremities 08/16/2022   Right lumbar radiculitis 07/05/2022   Chronic right-sided low back pain with right-sided sciatica 07/05/2022   Chronic hand pain, left 03/09/2022   Primary hypertension 03/09/2022   Encounter for general adult medical examination with abnormal findings 01/04/2022   Idiopathic progressive neuropathy 10/06/2021   Hyperglycemia 10/06/2021   Varicose veins of both lower extremities with pain 10/06/2021   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   Abnormal electrocardiogram (ECG) (EKG) 03/01/2021   Vitamin B12 deficiency 02/18/2021   Chronic venous insufficiency 02/17/2021   Hyperlipidemia with target LDL less than 130 08/26/2020   BPH without obstruction/lower urinary tract symptoms 08/26/2020   Carpal tunnel syndrome on left 04/28/2020   Nephrolithiasis 03/02/2015    PCP: Sanda Linger, MD  REFERRING PROVIDER: Angelina Sheriff, DO  REFERRING DIAG: [phone] (ICD-10-CM) - Chronic right-sided low back pain with right-sided sciatica M47.816 (ICD-10-CM) - Facet arthropathy, lumbar  Rationale for Evaluation and Treatment: Rehabilitation  THERAPY DIAG:  Abnormal posture  Muscle weakness (generalized)  Other abnormalities of gait and mobility  ONSET DATE: 08/16/2022 referral  SUBJECTIVE:                                                                                                                                                                                           SUBJECTIVE STATEMENT: Pt reports some  stiffness and pulling his in L side lower back today. Pt is alternating work on walking (up to 3.5 miles), HEP, arm weights; feels like energy and recovery from exercise is pretty good.  Pt has not heard about results from testing last week, sees Dr. Shearon Stalls 6/24.  PERTINENT HISTORY:  Back injury August 2023, hepatitis  PAIN:  Are you having pain? No  PRECAUTIONS: Fall  PATIENT GOALS: "to get back close to where I was; to get back to the gym"  OBJECTIVE:   DIAGNOSTIC FINDINGS:  Lumbar xray 01/2021: IMPRESSION: Advanced spondylosis, without acute osseous abnormality.   [facility] MRI 03/2021: IMPRESSION: 1. No acute process or suspicious mass identified in the pelvis. 2. Prostatomegaly. 3. Mild colonic diverticulosis. 4. 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   Insurance denied lumbar MRI  TODAY'S TREATMENT:                                                                                                                               TherEx: SciFit multi-peaks level 5 for 8 minutes using BUE/BLEs for neural priming for reciprocal movement, dynamic cardiovascular warmup and increased amplitude of stepping.   Tried seated sciatic nerve glide, no tension or nerve  sensations felt just muscle stretch (calf in RLE, HS in LLE)  Supine marches with TA contract 2 x 10 reps. Pt feels some "sensation" in his lower back with this exercise, subsides at rest.  Weighted sit to stands with 5.5 # weighted ball 2 x 15 reps with focus on TA contraction  Quick assessment of L shoulder ROM: flexion limited to 120 before onset of pain; abduction limited to 90 degrees, pain with ER. Encouraged pt to obtain a referral from PCP for shoulder evaluation if this continues to bother him.  PATIENT EDUCATION:  Education details: continue HEP, PT POC Person educated: Patient Education method: Medical illustrator Education comprehension: verbalized understanding  HOME EXERCISE PROGRAM: Access Code: [email or web address] URL: [email or web address] Date: 09/07/2022 Prepared by: Merry Lofty  Exercises - Supine Lower Trunk Rotation  - 1 x daily - 7 x weekly - 3 sets - 10 reps - Supine Single Knee to Chest Stretch  - 1 x daily - 7 x weekly - 3 sets - 30s hold - Leg Overs  - 1 x daily - 7 x weekly - 3 sets - 30s hold - Bird Dog  - 1 x daily - 7 x weekly - 3 sets - 10 reps - Prone Press Up  - 1 x daily - 7 x weekly - 3 sets - 10 reps - 2-3s hold - Plank with Thoracic Rotation on Counter  - 1 x daily - 7 x weekly - 3 sets - 10 reps - Side Stepping with Resistance at Ankles  - 1 x daily - 7 x weekly - 3 sets - 10 reps - Forward Monster Walks  - 1 x  daily - 7 x weekly - 3 sets - 10 reps - Swiss Ball March  - 1 x daily - 7 x weekly - 3 sets - 10 reps - Seated Long Arc Quad  - 1 x daily - 7 x weekly - 3 sets - 10 reps - Seated Chest Press with Bar  - 1 x daily - 7 x weekly - 3 sets - 10 reps - Seated Piriformis Stretch with Trunk Bend  - 1 x daily - 7 x weekly - 1 sets - 3-5 reps - 30 sec hold - Supine Piriformis Stretch with Leg Straight  - 1 x daily - 7 x weekly - 1 sets - 3-5 reps - 30 sec hold - Child's Pose Stretch  - 1 x daily - 7 x weekly - 1 sets - 5 reps - 30  sec hold   ASSESSMENT:  CLINICAL IMPRESSION: Emphasis of skilled PT session on continuing to work on core strengthening/stability and on quick assessment of L shoulder due to ongoing complaints of pain in this joint limiting his ability to perform quadruped exercises on HEP. Pt continues to exhibit good management of pain symptoms with no increase in pain following therapy. Pt agreeable with plan to d/c from OPPT services next session and continue with his HEP, will obtain referral for L shoulder if warranted. Continue POC.   OBJECTIVE IMPAIRMENTS: decreased knowledge of condition, decreased mobility, increased fascial restrictions, impaired sensation, postural dysfunction, and pain.   ACTIVITY LIMITATIONS: carrying, lifting, squatting, dressing, and caring for others  PARTICIPATION LIMITATIONS: interpersonal relationship, community activity, and yard work  PERSONAL FACTORS: Fitness, Time since onset of injury/illness/exacerbation, and 1-2 comorbidities: see below  are also affecting patient's functional outcome.   REHAB POTENTIAL: Fair time since onset  CLINICAL DECISION MAKING: Stable/uncomplicated  EVALUATION COMPLEXITY: Low   GOALS: Goals reviewed with patient? Yes  SHORT TERM GOALS: Target date: 09/15/22  Pt will be independent with initial HEP for improved symptom report  Baseline: to be updated; provided Goal status: MET   LONG TERM GOALS: Target date: 10/06/22  Pt will be independent with final HEP for improved symptom report  Baseline: to be updated, HEP in progress but not finalized yet (6/7) Goal status: IN PROGRESS  2.  Patient will improve Oswestry score to </= 4 to demonstrate no disability due to back pain Baseline: 7, 5/50 (6/7) Goal status: IN PROGRESS  NEW SHORT TERM GOALS=LONG TERM GOALS due to length of POC   NEW LONG TERM GOALS:  Target date: 11/03/2022  Pt will be independent with final HEP for improved strength, balance, transfers and  gait. Baseline: HEP in progress but not finalized yet (6/7) Goal status: IN PROGRESS  2.  Patient will improve Oswestry score to </= 4 to demonstrate no disability due to back pain Baseline:  7, 5/50 (6/7) Goal status: INITIAL    PLAN:  PT FREQUENCY: 1x/week  PT DURATION: 6 weeks + 4 weeks (recert)  PLANNED INTERVENTIONS: Therapeutic exercises, Therapeutic activity, Neuromuscular re-education, Balance training, Gait training, Patient/Family education, Self Care, Joint mobilization, Stair training, Vestibular training, DME instructions, Aquatic Therapy, Dry Needling, Electrical stimulation, Spinal mobilization, Cryotherapy, Moist heat, Taping, Manual therapy, and Re-evaluation.  PLAN FOR NEXT SESSION: assess LTG and d/c, shoulder referral?  Peter Congo, PT, DPT, CSRS 10/11/2022, 11:43 AM

## 2022-10-12 ENCOUNTER — Ambulatory Visit: Payer: Medicare HMO

## 2022-10-18 ENCOUNTER — Ambulatory Visit: Payer: Medicare HMO | Admitting: Physical Therapy

## 2022-10-18 DIAGNOSIS — R2689 Other abnormalities of gait and mobility: Secondary | ICD-10-CM

## 2022-10-18 DIAGNOSIS — R293 Abnormal posture: Secondary | ICD-10-CM

## 2022-10-18 DIAGNOSIS — M6281 Muscle weakness (generalized): Secondary | ICD-10-CM

## 2022-10-18 NOTE — Therapy (Signed)
OUTPATIENT PHYSICAL THERAPY THORACOLUMBAR TREATMENT-DISCHARGE NOTE   Patient Name: Andrew Stuart MRN: [id number] DOB:[birth year], 72 y.o., male Today's Date: 10/18/2022   PHYSICAL THERAPY DISCHARGE SUMMARY  Visits from Start of Care: 7  Current functional level related to goals / functional outcomes: Independent   Remaining deficits: Mild disability level due to back pain   Education / Equipment: Handout for HEP   Patient agrees to discharge. Patient goals were met. Patient is being discharged due to meeting the stated rehab goals.   END OF SESSION:  PT End of Session - 10/18/22 1116     Visit Number 7    Number of Visits 7    Date for PT Re-Evaluation 11/03/22   recert   Authorization Type [employer]    PT Start Time 1104    PT Stop Time 1116   d/c   PT Time Calculation (min) 12 min    Activity Tolerance Patient tolerated treatment well    Behavior During Therapy WFL for tasks assessed/performed                Past Medical History:  Diagnosis Date   Allergy    Arthritis    Hepatitis    Hyperlipidemia    Past Surgical History:  Procedure Laterality Date   CARPAL TUNNEL RELEASE Right 2015   ENDOVENOUS ABLATION SAPHENOUS VEIN W/ LASER Left 04/12/2022   endovenous laser ablation left greater saphenous vein and stab phlebectomy < 10 incisions left leg by Cari Caraway MD   TONSILLECTOMY     Patient Active Problem List   Diagnosis Date Noted   Facet arthropathy, lumbar 08/16/2022   Hamstring tightness of both lower extremities 08/16/2022   Right lumbar radiculitis 07/05/2022   Chronic right-sided low back pain with right-sided sciatica 07/05/2022   Chronic hand pain, left 03/09/2022   Primary hypertension 03/09/2022   Encounter for general adult medical examination with abnormal findings 01/04/2022   Idiopathic progressive neuropathy 10/06/2021   Hyperglycemia 10/06/2021   Varicose veins of both lower extremities with pain 10/06/2021    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   Abnormal electrocardiogram (ECG) (EKG) 03/01/2021   Vitamin B12 deficiency 02/18/2021   Chronic venous insufficiency 02/17/2021   Hyperlipidemia with target LDL less than 130 08/26/2020   BPH without obstruction/lower urinary tract symptoms 08/26/2020   Carpal tunnel syndrome on left 04/28/2020   Nephrolithiasis 03/02/2015    PCP: Sanda Linger, MD  REFERRING PROVIDER: Angelina Sheriff, DO  REFERRING DIAG: [phone] (ICD-10-CM) - Chronic right-sided low back pain with right-sided sciatica M47.816 (ICD-10-CM) - Facet arthropathy, lumbar  Rationale for Evaluation and Treatment: Rehabilitation  THERAPY DIAG:  Abnormal posture  Muscle weakness (generalized)  Other abnormalities of gait and mobility  ONSET DATE: 08/16/2022 referral  SUBJECTIVE:  SUBJECTIVE STATEMENT: Pt reports increased stiffness in his hips today but states that he did a lot yesterday including stairs.  Pt reports that he does get a weird sensation down his RLE at times that feels like a wave of water, not painful just a sensation. Pt reports he also does have some tenderness in what he indicates as his tibilias anterior muscles.Pt reports he did get back results of EMG and it shows decreased activation of his tibialis anterior bilaterally. Pt reports most of his HEP feels good and doable except for the quadruped exercise causes a lot of pain in his lower back and SI area.  Pt sees Dr. Shearon Stalls 6/24.  PERTINENT HISTORY:  Back injury August 2023, hepatitis  PAIN:  Are you having pain? No  PRECAUTIONS: Fall  PATIENT GOALS: "to get back close to where I was; to get back to the gym"  OBJECTIVE:   DIAGNOSTIC FINDINGS:  Lumbar xray 01/2021: IMPRESSION: Advanced  spondylosis, without acute osseous abnormality.   [facility] MRI 03/2021: IMPRESSION: 1. No acute process or suspicious mass identified in the pelvis. 2. Prostatomegaly. 3. Mild colonic diverticulosis. 4. 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   Insurance denied lumbar MRI  TODAY'S TREATMENT:                                                                                                                               TherAct: Reassessed Oswestry: 3/50  Discussed PT POC with plan to d/c from PT services this date.  PATIENT EDUCATION:  Education details: continue HEP Person educated: Patient Education method: Explanation Education comprehension: verbalized understanding  HOME EXERCISE PROGRAM: Access Code: [email or web address] URL: [email or web address] Date: 09/07/2022 Prepared by: Merry Lofty  Exercises - Supine Lower Trunk Rotation  - 1 x daily - 7 x weekly - 3 sets - 10 reps - Supine Single Knee to Chest Stretch  - 1 x daily - 7 x weekly - 3 sets - 30s hold - Leg Overs  - 1 x daily - 7 x weekly - 3 sets - 30s hold - Bird Dog  - 1 x daily - 7 x weekly - 3 sets - 10 reps - Prone Press Up  - 1 x daily - 7 x weekly - 3 sets - 10 reps - 2-3s hold - Plank with Thoracic Rotation on Counter  - 1 x daily - 7 x weekly - 3 sets - 10 reps - Side Stepping with Resistance at Ankles  - 1 x daily - 7 x weekly - 3 sets - 10 reps - Forward Monster Walks  - 1 x daily - 7 x weekly - 3 sets - 10 reps - Swiss Ball March  - 1 x daily - 7 x weekly - 3 sets - 10 reps - Seated Long Arc Quad  - 1 x daily - 7 x weekly - 3 sets - 10  reps - Seated Chest Press with Bar  - 1 x daily - 7 x weekly - 3 sets - 10 reps - Seated Piriformis Stretch with Trunk Bend  - 1 x daily - 7 x weekly - 1 sets - 3-5 reps - 30 sec hold - Supine Piriformis Stretch with Leg Straight  - 1 x daily - 7 x weekly - 1 sets - 3-5 reps - 30 sec hold -  Child's Pose Stretch  - 1 x daily - 7 x weekly - 1 sets - 5 reps - 30 sec hold   ASSESSMENT:  CLINICAL IMPRESSION: Emphasis of skilled PT session on reassessing LTG in preparation for d/c from PT services this date. Pt has met 2/2 LTG due to being independent with his final HEP and improving his score on the Oswestry from 7/50 initially to 3/50 this date, indicating decreased disability level. Pt agreeable with plan to d/c from OPPT services this date with plan to return in the future if warranted and/or follow up regarding referral for PT to address his shoulder pain.   OBJECTIVE IMPAIRMENTS: decreased knowledge of condition, decreased mobility, increased fascial restrictions, impaired sensation, postural dysfunction, and pain.   ACTIVITY LIMITATIONS: carrying, lifting, squatting, dressing, and caring for others  PARTICIPATION LIMITATIONS: interpersonal relationship, community activity, and yard work  PERSONAL FACTORS: Fitness, Time since onset of injury/illness/exacerbation, and 1-2 comorbidities: see below  are also affecting patient's functional outcome.   REHAB POTENTIAL: Fair time since onset  CLINICAL DECISION MAKING: Stable/uncomplicated  EVALUATION COMPLEXITY: Low   GOALS: Goals reviewed with patient? Yes  SHORT TERM GOALS: Target date: 09/15/22  Pt will be independent with initial HEP for improved symptom report  Baseline: to be updated; provided Goal status: MET   LONG TERM GOALS: Target date: 10/06/22  Pt will be independent with final HEP for improved symptom report  Baseline: to be updated, HEP in progress but not finalized yet (6/7) Goal status: IN PROGRESS  2.  Patient will improve Oswestry score to </= 4 to demonstrate no disability due to back pain Baseline: 7, 5/50 (6/7) Goal status: IN PROGRESS  NEW SHORT TERM GOALS=LONG TERM GOALS due to length of POC   NEW LONG TERM GOALS:  Target date: 11/03/2022  Pt will be independent with final HEP for improved  strength, balance, transfers and gait. Baseline: HEP in progress but not finalized yet (6/7) Goal status: MET  2.  Patient will improve Oswestry score to </= 4 to demonstrate no disability due to back pain Baseline:  7, 5/50 (6/7), 3/50 (6/19) Goal status: MET     Peter Congo, PT, DPT, CSRS 10/18/2022, 11:17 AM

## 2022-10-23 ENCOUNTER — Encounter: Payer: Medicare HMO | Attending: Physical Medicine and Rehabilitation | Admitting: Physical Medicine and Rehabilitation

## 2022-10-23 VITALS — BP 133/79 | HR 64 | Ht 69.0 in | Wt 173.0 lb

## 2022-10-23 DIAGNOSIS — G8929 Other chronic pain: Secondary | ICD-10-CM | POA: Insufficient documentation

## 2022-10-23 DIAGNOSIS — R252 Cramp and spasm: Secondary | ICD-10-CM | POA: Diagnosis present

## 2022-10-23 DIAGNOSIS — M47816 Spondylosis without myelopathy or radiculopathy, lumbar region: Secondary | ICD-10-CM | POA: Insufficient documentation

## 2022-10-23 DIAGNOSIS — I872 Venous insufficiency (chronic) (peripheral): Secondary | ICD-10-CM | POA: Insufficient documentation

## 2022-10-23 DIAGNOSIS — M629 Disorder of muscle, unspecified: Secondary | ICD-10-CM | POA: Insufficient documentation

## 2022-10-23 DIAGNOSIS — M5441 Lumbago with sciatica, right side: Secondary | ICD-10-CM | POA: Diagnosis present

## 2022-10-23 DIAGNOSIS — M6289 Other specified disorders of muscle: Secondary | ICD-10-CM | POA: Insufficient documentation

## 2022-10-23 MED ORDER — GABAPENTIN 100 MG PO CAPS: 100.0000 mg | ORAL_CAPSULE | Freq: Three times a day (TID) | ORAL | 5 refills | Status: DC

## 2022-10-23 MED ORDER — METHOCARBAMOL 500 MG PO TABS: 500.0000 mg | ORAL_TABLET | Freq: Three times a day (TID) | ORAL | 2 refills | Status: DC | PRN

## 2022-10-23 NOTE — Progress Notes (Signed)
Subjective:    Patient ID: Andrew Stuart, male    DOB: 1950/09/16, 72 y.o.   MRN: [id number]  HPI   Andrew Stuart is a 72 y.o. year old male  who  has a past medical history of Allergy, Arthritis, Hepatitis, and Hyperlipidemia.   They are presenting to PM&R clinic for follow up related to R lumbar radiculitis pain  Plan from last visit:  Chronic right-sided low back pain with right-sided sciatica -     Ambulatory referral to Physical Therapy I have prescribed you gabapentin 300 mg capsules.  Start with 1 capsule at nighttime.  Continue for 2 to 3 days.  If no side effects, no increased lethargy, can increase up to 1 capsule 3 times daily.  Please call clinic in 2 weeks to report any side effects.   Once you have completed physical therapy, we can reattempt authorization for an MRI of your low back for possible intervention planning.   In the interim, I will have you schedule an EMG with either myself or Dr. Wynn Banker.  We will schedule a general follow-up in 2 to 3 months.   Facet arthropathy, lumbar -     Ambulatory referral to Physical Therapy Lumbar xray 01/2021: IMPRESSION: Advanced spondylosis, without acute osseous abnormality.   Idiopathic progressive neuropathy -     Gabapentin; Take 1 capsule (300 mg total) by mouth 3 (three) times daily. Start with 1 capsule at nighttime.  Continue for 2 to 3 days.  If no side effects, no increased lethargy, can increase up to 1 capsule 3 times daily.  Please call clinic in 2 weeks to report any side effects.  Dispense: 90 capsule; Refill: 2     Vitamin B12 deficiency Reportedly WNL on last levels check and complicant with injections Does note pain started around time he was diagnosed with this   Hamstring tightness of both lower extremities I am sending you to physical therapy to work on your back pain and bilateral hamstring tightness.     [facility] MRI 03/2021: IMPRESSION: 1. No acute process or suspicious mass identified in the  pelvis. 2. Prostatomegaly. 3. Mild colonic diverticulosis. 4. 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   Interval Hx:  - Therapies: Finished PT Thursday; helped "some things"; heled strecth hamstrings but cannot tolerate laying on his stomach due to shooting pains down the right leg.   - Follow ups: Chronic R L5-S1 radiculopathy on EMG, with signs of active denervation   - Falls: none   - DME: none   - Medications: "It's good for sleeping at nighttime"; takes 1 at nighttime 300 mg. Does make him groggy/sleepy, makes it hard to wake up in the AM.    - Other concerns: Re: R leg shooting pains; "they comes and go; I can go 2 or 3 days without it and then it will come on". Usually comes on when he gets up after sitting down. Usually lasts very short period but will come/go periodically. Husband states oit is effecting his quality of life at this point.   Patient had a sibling recently pass away from ALS and wishes to discuss if he should be worried about this.   Pain Inventory Average Pain 4 Pain Right Now 2 My pain is intermittent, sharp, and dull  In the last 24 hours, has pain interfered with the following? General activity 2 Relation with others 1 Enjoyment of life 0 What  TIME of day is your pain at its worst? morning  Sleep (in general) Good  Pain is worse with: standing and some activites Pain improves with: rest, heat/ice, therapy/exercise, and medication Relief from Meds: 5  Family History  Problem Relation Age of Onset   Arthritis Mother    Diabetes Mother    Early death Mother    Heart disease Mother    Hyperlipidemia Mother    Colon cancer Mother    Early death Father    Early death Sister    Colon cancer Sister    ALS Sister    Early death Brother    Lung cancer Brother    Liver cancer Brother    Social History   Socioeconomic History   Marital status: Married    Spouse name:  Not on file   Number of children: Not on file   Years of education: Not on file   Highest education level: Not on file  Occupational History   Not on file  Tobacco Use   Smoking status: Never    Passive exposure: Never   Smokeless tobacco: Never  Vaping Use   Vaping Use: Never used  Substance and Sexual Activity   Alcohol use: Yes    Alcohol/week: 7.0 standard drinks of alcohol    Types: 7 Glasses of wine per week   Drug use: Never   Sexual activity: Yes    Partners: Male    Birth control/protection: Condom  Other Topics Concern   Not on file  Social History Narrative   Not on file   Social Determinants of Health   Financial Resource Strain: Low Risk  (01/16/2022)   Overall Financial Resource Strain (CARDIA)    Difficulty of Paying Living Expenses: Not hard at all  Food Insecurity: No Food Insecurity (01/16/2022)   Hunger Vital Sign    Worried About Running Out of Food in the Last Year: Never true    Ran Out of Food in the Last Year: Never true  Transportation Needs: No Transportation Needs (01/16/2022)   PRAPARE - Administrator, Civil Service (Medical): No    Lack of Transportation (Non-Medical): No  Physical Activity: Sufficiently Active (01/16/2022)   Exercise Vital Sign    Days of Exercise per Week: 6 days    Minutes of Exercise per Session: 60 min  Stress: No Stress Concern Present (01/16/2022)   [employer] of Occupational Health - Occupational Stress Questionnaire    Feeling of Stress : Not at all  Social Connections: Moderately Isolated (01/16/2022)   Social Connection and Isolation Panel [NHANES]    Frequency of Communication with Friends and Family: More than three times a week    Frequency of Social Gatherings with Friends and Family: More than three times a week    Attends Religious Services: Never    Database administrator or Organizations: No    Attends Engineer, structural: Never    Marital Status: Married   Past Surgical  History:  Procedure Laterality Date   CARPAL TUNNEL RELEASE Right 2015   ENDOVENOUS ABLATION SAPHENOUS VEIN W/ LASER Left 04/12/2022   endovenous laser ablation left greater saphenous vein and stab phlebectomy < 10 incisions left leg by Cari Caraway MD   TONSILLECTOMY     Past Surgical History:  Procedure Laterality Date   CARPAL TUNNEL RELEASE Right 2015   ENDOVENOUS ABLATION SAPHENOUS VEIN W/ LASER Left 04/12/2022   endovenous laser ablation left greater saphenous vein and stab phlebectomy <  10 incisions left leg by Cari Caraway MD   TONSILLECTOMY     Past Medical History:  Diagnosis Date   Allergy    Arthritis    Hepatitis    Hyperlipidemia    There were no vitals taken for this visit.  Opioid Risk Score:   Fall Risk Score:  `1  Depression screen [facility] 2/9     08/16/2022    9:54 AM 07/05/2022    1:00 PM 01/16/2022    8:46 AM 10/06/2021   11:32 AM 08/26/2020    9:54 AM 04/28/2020    8:34 AM  Depression screen PHQ 2/9  Decreased Interest 0 0 0 0 0 0  Down, Depressed, Hopeless 0 0 0 0 0 0  PHQ - 2 Score 0 0 0 0 0 0  Altered sleeping 0       Tired, decreased energy 1       Change in appetite 0       Feeling bad or failure about yourself  0       Trouble concentrating 0       Moving slowly or fidgety/restless 0       PHQ-9 Score 1           Review of Systems  Musculoskeletal:  Positive for back pain.       LT shoulder/arm pain Rt leg pain  All other systems reviewed and are negative.      Objective:   Physical Exam   PE: Constitution: Appropriate appearance for age. No apparent distress  Resp: No respiratory distress. No accessory muscle usage. on RA and CTAB Cardio: Well perfused appearance. No peripheral edema. Abdomen: Nondistended. Nontender.   Psych: Appropriate mood and affect. Neuro: AAOx4. No apparent cognitive deficits   Neurologic Exam:   DTRs: Reflexes were 2+ in bilateral achilles, patella, biceps, BR and triceps. Babinsky: flexor responses  b/l.   Hoffmans: negative b/l Sensory exam: revealed normal sensation in all dermatomal regions in left lower extremity and with reduced sensation to light touch in RLE lateral calf and across low buttocks Motor exam: strength 5/5 throughout bilateral lower extremities Coordination: Fine motor coordination was normal.   Gait: Normal MSK: + B/l lateral hamstring tightness; ROM knees and ankles WNL + Calf spasms on R with SLR       Assessment & Plan:   KAYGE BOURGEOIS is a 72 y.o. year old male  who  has a past medical history of Allergy, Arthritis, Hepatitis, and Hyperlipidemia.   They are presenting to PM&R clinic for follow up related to R lumbar radiculitis pain and cramping/tightness of bilateral LE.  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  If no contraindications, I will have you get a epidural steroid injection with Dr. Wynn Banker and follow-up with you 1 to 2 months after this injection to assess response  Due to lethargy on 300 mg gabapentin, reduced dose to 100 mg 3 times daily.  Prescription sent.  Leg cramping Hamstring tightness of both lower extremities Continue home exercise program, stretching, remaining active  Recommended heat and foam roller to use at least 3 times weekly  Prescribed methocarbamol 500 mg up to 3 times daily as needed for cramping and tightness.  Chronic venous insufficiency Use compression socks when doing strenuous activity  or on your feet for long periods.

## 2022-10-23 NOTE — Patient Instructions (Signed)
  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  If no contraindications, I will have you get a epidural steroid injection with Dr. Wynn Banker and follow-up with you 1 to 2 months after this injection to assess response  Due to lethargy on 300 mg gabapentin, reduced dose to 100 mg 3 times daily.  Prescription sent.  Leg cramping Hamstring tightness of both lower extremities Continue home exercise program, stretching, remaining active  Recommended heat and foam roller to use at least 3 times weekly  Prescribed methocarbamol 500 mg up to 3 times daily as needed for cramping and tightness.  Chronic venous insufficiency Use compression socks when doing strenuous activity or on your feet for long periods.

## 2022-10-24 ENCOUNTER — Encounter: Payer: Self-pay | Admitting: Physical Medicine and Rehabilitation

## 2022-10-29 ENCOUNTER — Ambulatory Visit
Admission: RE | Admit: 2022-10-29 | Discharge: 2022-10-29 | Disposition: A | Discharge status: Home / Self Care | Payer: Medicare HMO | Source: Ambulatory Visit | Attending: Physical Medicine and Rehabilitation | Admitting: Physical Medicine and Rehabilitation

## 2022-10-29 DIAGNOSIS — G8929 Other chronic pain: Secondary | ICD-10-CM

## 2022-11-03 ENCOUNTER — Ambulatory Visit (INDEPENDENT_AMBULATORY_CARE_PROVIDER_SITE_OTHER): Payer: Medicare HMO

## 2022-11-03 DIAGNOSIS — E538 Deficiency of other specified B group vitamins: Secondary | ICD-10-CM | POA: Diagnosis not present

## 2022-11-03 MED ORDER — CYANOCOBALAMIN 1000 MCG/ML IJ SOLN
1000.0000 ug | Freq: Once | INTRAMUSCULAR | Status: AC
  Administered 2022-11-03: 1000 ug via INTRAMUSCULAR

## 2022-11-03 NOTE — Progress Notes (Signed)
Pt here for monthly B12 injection per   B12 1000mcg given IM and pt tolerated injection well.    

## 2022-11-06 ENCOUNTER — Encounter: Payer: Self-pay | Admitting: Physical Medicine and Rehabilitation

## 2022-11-06 DIAGNOSIS — M48062 Spinal stenosis, lumbar region with neurogenic claudication: Secondary | ICD-10-CM | POA: Insufficient documentation

## 2022-11-06 DIAGNOSIS — R252 Cramp and spasm: Secondary | ICD-10-CM

## 2022-11-06 DIAGNOSIS — G834 Cauda equina syndrome: Secondary | ICD-10-CM | POA: Insufficient documentation

## 2022-11-06 DIAGNOSIS — G8929 Other chronic pain: Secondary | ICD-10-CM

## 2022-11-06 NOTE — Telephone Encounter (Signed)
Called patient regarding results of MRI. Reviewing red flag symptoms, he endorses worsening buttocks numbness/tingling and bowel control issues which are somewhat helped with metamucil. Still with lower extremity weakness and neuropathic pain down RLE. Discussed with patient referral to Neurosurgery prior to trying ESI as I feel evaluation by them is most important given results of MRI; patient in agreement. He has been informed to go to the ER if any changes in or worsening of symptoms prior to his appointment.  Neurosurgical referral sent. If managed conservatively, happy to reschedule him for [facility] with our office per their preference.    Angelina Sheriff, DO 11/06/2022

## 2022-11-08 NOTE — Progress Notes (Unsigned)
Referring Physician:  Angelina Sheriff, DO [address] [place],  [place] [address]  Primary Physician:  [name], MD  History of Present Illness: 11/09/2022 [name] is here today with a chief complaint of weakness in his right leg and some difficulty with walking.  He also gets pain and numbness into his legs prickly when he walks.  Activity makes it worse.  He sometimes has to sit down to help with his discomfort.  Heat and Aleve also help.  He has right lateral hip pain as well as posterior thigh pain.  He has bilateral calf cramping intermittently.  He gets numbness in his right lateral calf as well.  His legs feel tight in the morning but get better as he loosens up.  His husband reports that he is not as active as he previously was.  He is also noted that he walks bent forward compared to prior.   He reports arm numbness when he bends his head back.   Bowel/Bladder Dysfunction: none  Conservative measures:  Physical therapy:   has participated in from 08/23/22 to 10/18/22 for his balance and back Multimodal medical therapy including regular antiinflammatories: gabapentin, methocarbamol, naproxen  Injections: has not received epidural steroid injections  Past Surgery: denies  ANTHONEY SHEPPARD has some symptoms of cervical myelopathy.  The symptoms are causing a significant impact on the patient's life.   I have utilized the care everywhere function in epic to review the outside records available from external health systems.  Review of Systems:  A 10 point review of systems is negative, except for the pertinent positives and negatives detailed in the HPI.  Past Medical History: Past Medical History:  Diagnosis Date   Allergy    Arthritis    Hepatitis    Hyperlipidemia     Past Surgical History: Past Surgical History:  Procedure Laterality Date   CARPAL TUNNEL RELEASE Right 2015   ENDOVENOUS ABLATION SAPHENOUS VEIN W/ LASER Left  04/12/2022   endovenous laser ablation left greater saphenous vein and stab phlebectomy < 10 incisions left leg by Cari Caraway MD   TONSILLECTOMY      Allergies: Allergies as of 11/09/2022 - Review Complete 11/09/2022  Allergen Reaction Noted   Bactrim [sulfamethoxazole-trimethoprim] Rash 03/30/2021   Azithromycin  01/04/2022   Bee pollen  04/28/2020   Blueberry flavor  04/28/2020   Strawberry extract  04/28/2020    Medications:  Current Outpatient Medications:    aspirin EC 81 MG tablet, Take 1 tablet (81 mg total) by mouth daily., Disp: 90 tablet, Rfl: 1   atorvastatin (LIPITOR) 20 MG tablet, TAKE 1 TABLET BY MOUTH EVERY DAY, Disp: 90 tablet, Rfl: 1   fluticasone (FLONASE) 50 MCG/ACT nasal spray, Place into both nostrils daily., Disp: , Rfl:    gabapentin (NEURONTIN) 100 MG capsule, Take 1 capsule (100 mg total) by mouth 3 (three) times daily., Disp: 90 capsule, Rfl: 5   methocarbamol (ROBAXIN) 500 MG tablet, Take 1 tablet (500 mg total) by mouth every 8 (eight) hours as needed for muscle spasms., Disp: 90 tablet, Rfl: 2   Multiple Vitamin (MULTIVITAMIN WITH MINERALS) TABS tablet, Take 1 tablet by mouth daily., Disp: , Rfl:    Naproxen Sodium (ALEVE PO), Take by mouth., Disp: , Rfl:   Social History: Social History   Tobacco Use   Smoking status: Never    Passive exposure: Never   Smokeless tobacco: Never  Vaping Use   Vaping status: Never  Used  Substance Use Topics   Alcohol use: Yes    Alcohol/week: 7.0 standard drinks of alcohol    Types: 7 Glasses of wine per week   Drug use: Never    Family Medical History: Family History  Problem Relation Age of Onset   Arthritis Mother    Diabetes Mother    Early death Mother    Heart disease Mother    Hyperlipidemia Mother    Colon cancer Mother    Early death Father    Early death Sister    Colon cancer Sister    ALS Sister    Early death Brother    Lung cancer Brother    Liver cancer Brother     Physical  Examination: Vitals:   11/09/22 0903  BP: 118/82    General: Patient is in no apparent distress. Attention to examination is appropriate.  Neck:   Supple.  Full range of motion.  Respiratory: Patient is breathing without any difficulty.   NEUROLOGICAL:     Awake, alert, oriented to person, place, and time.  Speech is clear and fluent.   Cranial Nerves: Pupils equal round and reactive to light.  Facial tone is symmetric.  Facial sensation is symmetric. Shoulder shrug is symmetric. Tongue protrusion is midline.  There is no pronator drift.  Strength: Side Biceps Triceps Deltoid Interossei Grip Wrist Ext. Wrist Flex.  R 5 5 5 5 5 5 5   L 5 5 5 5 5 5 5    Side Iliopsoas Quads Hamstring PF DF EHL  R 4+ 4+ 5 5 5 5   L 4+ 5 5 5 5 5    Reflexes are 1+ and symmetric at the biceps, triceps, brachioradialis, 3+patella and 2+achilles.   Hoffman's is absent.   Bilateral upper and lower extremity sensation is intact to light touch with exception of his legs which are diminished.    No evidence of dysmetria noted.  Gait is wide-based.     Medical Decision Making  Imaging: MRI L spine 11/04/2022 Disc levels:   T12-L1: Small disc bulge without spinal canal stenosis   L1-L2: Small disc bulge. No spinal canal stenosis. No neural foraminal stenosis.   L2-L3: Small disc bulge with moderate facet hypertrophy. Mild spinal canal stenosis. No neural foraminal stenosis.   L3-L4: Large disc bulge with endplate spurring. Severe spinal canal stenosis. Mild right and moderate left neural foraminal stenosis.   L4-L5: Moderate facet hypertrophy and intermediate sized disc bulge with endplate spurring. Mild spinal canal stenosis. Moderate right and mild left neural foraminal stenosis.   L5-S1: Small disc bulge. No spinal canal stenosis. No neural foraminal stenosis.   Visualized sacrum: Normal.   IMPRESSION: 1. Severe spinal canal stenosis at L3-L4 with impingement of the cauda equina and  moderate left neural foraminal stenosis. 2. Mild spinal canal stenosis at L2-L3 and L4-L5. 3. Moderate right L4-L5 neural foraminal stenosis.     Electronically Signed   By: Deatra Robinson M.D.   On: 11/04/2022 23:53  I have personally reviewed the images and agree with the above interpretation.  Assessment and Plan: Mr. Angell is a pleasant 72 y.o. 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  Additionally, he has some signs and symptoms concerning for possible cervical or thoracic myelopathy.  He has elevated reflexes in his legs versus his arms.  Additionally, there is a hint of possible myelomalacia in the thoracic spine on his lumbar MRI scan.  He has tried and failed conservative management for  his neurogenic claudication.  At this point, no further conservative management is indicated.  I recommend an L3-4 decompression.  I discussed the planned procedure at length with the patient, including the risks, benefits, alternatives, and indications. The risks discussed include but are not limited to bleeding, infection, need for reoperation, spinal fluid leak, stroke, vision loss, anesthetic complication, coma, paralysis, and even death. I also described in detail that improvement was not guaranteed.  The patient expressed understanding of these risks, and asked that we proceed with surgery. I described the surgery in layman's terms, and gave ample opportunity for questions, which were answered to the best of my ability.  Before moving forward with the surgery, I would like to obtain MRI scan of the cervical and thoracic spine to ensure that he does not have substantial compression at 1 of these 2 levels that is causing his difficulty with walking and hyperreflexia.  Thank you for involving me in the care of this patient.      Cordarro Spinnato K. Myer Haff MD, [facility]

## 2022-11-09 ENCOUNTER — Encounter: Payer: Self-pay | Admitting: Neurosurgery

## 2022-11-09 ENCOUNTER — Ambulatory Visit (INDEPENDENT_AMBULATORY_CARE_PROVIDER_SITE_OTHER): Payer: Medicare HMO | Admitting: Neurosurgery

## 2022-11-09 VITALS — BP 118/82 | Ht 69.0 in | Wt 173.2 lb

## 2022-11-09 DIAGNOSIS — M4714 Other spondylosis with myelopathy, thoracic region: Secondary | ICD-10-CM | POA: Diagnosis not present

## 2022-11-09 DIAGNOSIS — M48062 Spinal stenosis, lumbar region with neurogenic claudication: Secondary | ICD-10-CM

## 2022-11-09 DIAGNOSIS — G959 Disease of spinal cord, unspecified: Secondary | ICD-10-CM | POA: Diagnosis not present

## 2022-11-09 NOTE — Patient Instructions (Signed)
Please see below for information in regards to your upcoming surgery:   Planned surgery: L3-4 decompression   Surgery date: 12/13/22 at [facility] - you will find out your arrival time the business day before your surgery.   Pre-op appointment at [facility] Pre-admit Testing: we will call you with a date/time for this. Pre-admit testing is located on the first floor of the [facility] building, [address] [facility] [address]. Please bring all prescriptions in the original prescription bottles to your appointment, even if you have reviewed medications by phone with a pharmacy representative. During this appointment, they will advise you which medications you can take the morning of surgery, and which medications you will need to hold for surgery. Pre-op labs may be done at your pre-op appointment. You are not required to fast for these labs. Should you need to change your pre-op appointment, please call Pre-admit testing at [phone].    Surgical clearance: we will send a clearance form to Dr Sanda Linger     Blood thinners:   Aspirin:   stop aspirin 7 days prior, resume aspirin 14 days after    Common restrictions after surgery: No bending, lifting, or twisting ("BLT"). Avoid lifting objects heavier than 10 pounds for the first 6 weeks after surgery. Where possible, avoid household activities that involve lifting, bending, reaching, pushing, or pulling such as laundry, vacuuming, grocery shopping, and childcare. Try to arrange for help from friends and family for these activities while your back heals. Do not drive while taking prescription pain medication. Weeks 6 through 12 after surgery: avoid lifting more than 25 pounds.     How to contact us:  If you have any questions/concerns before or after surgery, you can reach [place] at [phone], or you can send a mychart message. We can be reached by phone or mychart 8am-4pm, Monday-Friday.  *Please note:  Calls after 4pm are forwarded to a third party answering service. Mychart messages are not routinely monitored during evenings, weekends, and holidays. Please call our office to contact the answering service for urgent concerns during non-business hours.    Appointments/FMLA & disability paperwork: Patty [employer] Cristin  Nurse: Royston Cowper  Medical assistants: Laurann Montana Physician Assistant's: Manning Charity & Drake Leach Surgeons: Venetia Night, MD & Ernestine Mcmurray, MD

## 2022-11-10 ENCOUNTER — Telehealth: Payer: Self-pay | Admitting: Neurosurgery

## 2022-11-10 NOTE — Telephone Encounter (Signed)
L3-4 decompression on 12/13/22  He wants to change his surgery to 9/25 if possible?

## 2022-11-10 NOTE — Telephone Encounter (Signed)
Patient has been notified that his surgery has been rescheduled to 01/24/23 and all his postop appts have been scheduled accordingly.

## 2022-11-27 DIAGNOSIS — I8393 Asymptomatic varicose veins of bilateral lower extremities: Secondary | ICD-10-CM

## 2022-11-28 ENCOUNTER — Telehealth: Payer: Self-pay | Admitting: Neurosurgery

## 2022-11-28 DIAGNOSIS — G959 Disease of spinal cord, unspecified: Secondary | ICD-10-CM

## 2022-11-28 DIAGNOSIS — M4714 Other spondylosis with myelopathy, thoracic region: Secondary | ICD-10-CM

## 2022-11-28 DIAGNOSIS — M48062 Spinal stenosis, lumbar region with neurogenic claudication: Secondary | ICD-10-CM

## 2022-11-28 MED ORDER — DIAZEPAM 5 MG PO TABS: ORAL_TABLET | ORAL | 0 refills | Status: DC

## 2022-11-28 NOTE — Telephone Encounter (Signed)
Valium sent to pharmacy for MRI scans. Remind him that he will need a driver.

## 2022-11-28 NOTE — Telephone Encounter (Signed)
Patient was not able to complete his MRI, he still got claustrophobic. They recommended that he ask his doctor for medication and then they will reschedule the MRI.  [facility] [address] in [place]

## 2022-11-28 NOTE — Telephone Encounter (Signed)
Junious Dresser from [facility] is asking for new MRI thoracic and cervical orders. The previous orders were marked "failed study". Fax [phone], any questions call Junious Dresser at [phone] option 2

## 2022-11-28 NOTE — Telephone Encounter (Signed)
Orders faxed

## 2022-11-28 NOTE — Telephone Encounter (Signed)
Can you send him in something to take for his MRIs. He was unable to complete the ones he was scheduled for.

## 2022-12-01 ENCOUNTER — Ambulatory Visit: Payer: Medicare HMO | Admitting: Physical Medicine & Rehabilitation

## 2022-12-01 ENCOUNTER — Other Ambulatory Visit: Payer: Self-pay

## 2022-12-01 DIAGNOSIS — Z01818 Encounter for other preprocedural examination: Secondary | ICD-10-CM

## 2022-12-04 ENCOUNTER — Ambulatory Visit (INDEPENDENT_AMBULATORY_CARE_PROVIDER_SITE_OTHER): Payer: Medicare HMO

## 2022-12-04 DIAGNOSIS — E538 Deficiency of other specified B group vitamins: Secondary | ICD-10-CM

## 2022-12-04 MED ORDER — CYANOCOBALAMIN 1000 MCG/ML IJ SOLN
1000.0000 ug | Freq: Once | INTRAMUSCULAR | Status: AC
Start: 2022-12-04 — End: 2022-12-04
  Administered 2022-12-04: 1000 ug via INTRAMUSCULAR

## 2022-12-04 NOTE — Progress Notes (Signed)
After obtaining consent, and per orders of Dr. Yetta Barre, injection of 12 given by Ferdie Ping. Patient instructed to report any adverse reaction to me immediately.

## 2022-12-08 ENCOUNTER — Telehealth: Payer: Self-pay | Admitting: Orthopedic Surgery

## 2022-12-08 ENCOUNTER — Other Ambulatory Visit: Payer: Self-pay | Admitting: Neurosurgery

## 2022-12-08 DIAGNOSIS — M48062 Spinal stenosis, lumbar region with neurogenic claudication: Secondary | ICD-10-CM

## 2022-12-08 DIAGNOSIS — G959 Disease of spinal cord, unspecified: Secondary | ICD-10-CM

## 2022-12-08 DIAGNOSIS — M4714 Other spondylosis with myelopathy, thoracic region: Secondary | ICD-10-CM

## 2022-12-08 MED ORDER — DIAZEPAM 5 MG PO TABS: ORAL_TABLET | ORAL | 0 refills | Status: DC

## 2022-12-08 NOTE — Telephone Encounter (Signed)
Patient has been notified about prescription.

## 2022-12-08 NOTE — Telephone Encounter (Signed)
Can you send in the prescription of Valium?
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
## 2022-12-14 ENCOUNTER — Encounter (INDEPENDENT_AMBULATORY_CARE_PROVIDER_SITE_OTHER): Payer: Self-pay

## 2022-12-21 ENCOUNTER — Telehealth: Payer: Self-pay

## 2022-12-21 ENCOUNTER — Inpatient Hospital Stay
Admission: RE | Admit: 2022-12-21 | Discharge: 2022-12-21 | Disposition: A | Discharge status: Home / Self Care | Payer: Self-pay | Source: Ambulatory Visit | Attending: Neurosurgery | Admitting: Neurosurgery

## 2022-12-21 ENCOUNTER — Other Ambulatory Visit: Payer: Self-pay

## 2022-12-21 DIAGNOSIS — Z049 Encounter for examination and observation for unspecified reason: Secondary | ICD-10-CM

## 2022-12-21 NOTE — Telephone Encounter (Addendum)
MRI cervical and thoracic spine have been completed at [facility]. Reports are in Care Everywhere. Images have been loaded to Epic.

## 2022-12-21 NOTE — Telephone Encounter (Signed)
Images have been received and loaded to Epic

## 2022-12-21 NOTE — Telephone Encounter (Signed)
MRI thoracic results sent from [facility]

## 2022-12-21 NOTE — Telephone Encounter (Signed)
Routed images to Dr Myer Haff for review

## 2022-12-21 NOTE — Telephone Encounter (Signed)
MRI cervical also

## 2022-12-21 NOTE — Telephone Encounter (Signed)
[employer] request sent to [facility] 12/15/22: MRI cervical 12/15/22: MRI thoracic

## 2022-12-22 ENCOUNTER — Encounter: Payer: Self-pay | Admitting: Neurosurgery

## 2022-12-25 ENCOUNTER — Telehealth (INDEPENDENT_AMBULATORY_CARE_PROVIDER_SITE_OTHER): Payer: Medicare HMO | Admitting: Neurosurgery

## 2022-12-25 DIAGNOSIS — M4802 Spinal stenosis, cervical region: Secondary | ICD-10-CM

## 2022-12-25 NOTE — Telephone Encounter (Signed)
I called Andrew Stuart to review his imaging findings.  He has severe cervical stenosis mild at C3-4.  He has symptoms of cervical spondylotic myelopathy.  We discussed that conservative management is not indicated for cervical spondylotic myelopathy.  I recommended that he move forward with C3-7 cervical laminoplasty with left C3-5 foraminotomies and left C6-7 foraminotomies.  We reviewed that many of the risks of the same as they were with lumbar spine surgery, but there are specific risks for neck surgery including spinal cord injury or worsening of his condition.  He and his husband expressed understanding.  Will work on scheduling surgery.

## 2022-12-26 ENCOUNTER — Telehealth: Payer: Self-pay

## 2022-12-26 ENCOUNTER — Other Ambulatory Visit: Payer: Self-pay

## 2022-12-26 DIAGNOSIS — M4802 Spinal stenosis, cervical region: Secondary | ICD-10-CM

## 2022-12-26 DIAGNOSIS — G9589 Other specified diseases of spinal cord: Secondary | ICD-10-CM

## 2022-12-26 DIAGNOSIS — Z01818 Encounter for other preprocedural examination: Secondary | ICD-10-CM

## 2022-12-26 DIAGNOSIS — G959 Disease of spinal cord, unspecified: Secondary | ICD-10-CM

## 2022-12-26 NOTE — Telephone Encounter (Signed)
Per Dr Myer Haff, we will plan to keep him on the OR schedule for 01/24/23, but we will cancel the L3-4 decompression and schedule C3-7 laminoplasty due to cervical MRI findings.   L3-4 decompression will be rescheduled at a later time.  Dr Myer Haff has discussed this with Andrew Stuart.  Previously discussed instructions (during his office visit on 11/09/22) will stay the same (with the exception of the "planned surgery", which is now "C3-7 laminoplasty").  His pre-op and post op appointments remain unchanged because the surgery date is the same. I have sent him a mychart message about this.

## 2022-12-27 ENCOUNTER — Ambulatory Visit: Payer: Medicare HMO | Admitting: Physical Medicine and Rehabilitation

## 2022-12-28 ENCOUNTER — Encounter: Payer: Medicare HMO | Admitting: Orthopedic Surgery

## 2023-01-02 ENCOUNTER — Ambulatory Visit (INDEPENDENT_AMBULATORY_CARE_PROVIDER_SITE_OTHER): Payer: Medicare HMO

## 2023-01-02 VITALS — Ht 69.0 in | Wt 169.0 lb

## 2023-01-02 DIAGNOSIS — Z0000 Encounter for general adult medical examination without abnormal findings: Secondary | ICD-10-CM

## 2023-01-02 NOTE — Progress Notes (Signed)
Subjective:   Andrew Stuart is a 72 y.o. male who presents for Medicare Annual/Subsequent preventive examination.  Visit Complete: Virtual  I connected with  Andrew Stuart on 01/02/23 by a audio enabled telemedicine application and verified that I am speaking with the correct person using two identifiers.  Patient Location: Home  Provider Location: Home Office  I discussed the limitations of evaluation and management by telemedicine. The patient expressed understanding and agreed to proceed.  Patient Medicare AWV questionnaire was completed by the patient on 12/29/2022; I have confirmed that all information answered by patient is correct and no changes since this date.  Vital Signs: Because this visit was a virtual/telehealth visit, some criteria may be missing or patient reported. Any vitals not documented were not able to be obtained and vitals that have been documented are patient reported.    Review of Systems     Cardiac Risk Factors include: advanced age (>63men, >56 women);dyslipidemia;family history of premature cardiovascular disease;male gender     Objective:    Today's Vitals   01/02/23 1132 01/02/23 1141  Weight: 169 lb (76.7 kg)   Height: 5\' 9"  (1.753 m)   PainSc: 4  4   PainLoc: Back    Body mass index is 24.96 kg/m.     01/02/2023   11:47 AM 08/23/2022   11:03 AM 01/16/2022    8:48 AM 01/09/2021   10:40 AM  Advanced Directives  Does Patient Have a Medical Advance Directive? Yes Yes Yes No;Yes  Type of Estate agent of [place];Living will  Healthcare Power of [place];Living will Healthcare Power of [place];Living will  Does patient want to make changes to medical advance directive? Yes (Inpatient - patient defers changing a medical advance directive and declines information at this time)     Copy of Healthcare Power of Attorney in Chart? No - copy requested  No - copy requested No - copy requested  Would patient like information on  creating a medical advance directive?    No - Patient declined    Current Medications (verified) Outpatient Encounter Medications as of 01/02/2023  Medication Sig   acetaminophen (TYLENOL) 500 MG tablet Take 500 mg by mouth every 6 (six) hours as needed for moderate pain.   atorvastatin (LIPITOR) 20 MG tablet TAKE 1 TABLET BY MOUTH EVERY DAY   diazepam (VALIUM) 5 MG tablet 1 po 30 minutes prior to MRI scan. May repeat x 1 right before MRI scan if needed. You will need a driver.   fluticasone (FLONASE) 50 MCG/ACT nasal spray Place into both nostrils daily.   gabapentin (NEURONTIN) 100 MG capsule Take 1 capsule (100 mg total) by mouth 3 (three) times daily.   methocarbamol (ROBAXIN) 500 MG tablet Take 1 tablet (500 mg total) by mouth every 8 (eight) hours as needed for muscle spasms.   Multiple Vitamin (MULTIVITAMIN WITH MINERALS) TABS tablet Take 1 tablet by mouth daily.   aspirin EC 81 MG tablet Take 1 tablet (81 mg total) by mouth daily. (Patient not taking: Reported on 01/02/2023)   Naproxen Sodium (ALEVE PO) Take by mouth. (Patient not taking: Reported on 01/02/2023)   No facility-administered encounter medications on file as of 01/02/2023.    Allergies (verified) Bactrim [sulfamethoxazole-trimethoprim], Azithromycin, Bee pollen, Blueberry flavor, and Strawberry extract   History: Past Medical History:  Diagnosis Date   Allergy    Arthritis    Hepatitis    Hyperlipidemia    Nephrolithiasis    Past Surgical History:  Procedure Laterality Date   CARPAL TUNNEL RELEASE Right 2015   COLONOSCOPY     ENDOVENOUS ABLATION SAPHENOUS VEIN W/ LASER Left 04/12/2022   endovenous laser ablation left greater saphenous vein and stab phlebectomy < 10 incisions left leg by Cari Caraway MD   HEMORRHOID SURGERY     HERNIA REPAIR     left foot surgery     TONSILLECTOMY     Family History  Problem Relation Age of Onset   Arthritis Mother    Diabetes Mother    Early death Mother    Heart disease  Mother    Hyperlipidemia Mother    Colon cancer Mother    Early death Father    Early death Sister    Colon cancer Sister    ALS Sister    Early death Brother    Lung cancer Brother    Liver cancer Brother    Social History   Socioeconomic History   Marital status: Married    Spouse name: Not on file   Number of children: Not on file   Years of education: Not on file   Highest education level: Some college, no degree  Occupational History   Not on file  Tobacco Use   Smoking status: Never    Passive exposure: Never   Smokeless tobacco: Never  Vaping Use   Vaping status: Never Used  Substance and Sexual Activity   Alcohol use: Yes    Alcohol/week: 7.0 standard drinks of alcohol    Types: 7 Glasses of wine per week   Drug use: Never   Sexual activity: Yes    Partners: Male    Birth control/protection: Condom  Other Topics Concern   Not on file  Social History Narrative   Not on file   Social Determinants of Health   Financial Resource Strain: Low Risk  (01/02/2023)   Overall Financial Resource Strain (CARDIA)    Difficulty of Paying Living Expenses: Not hard at all  Food Insecurity: No Food Insecurity (01/02/2023)   Hunger Vital Sign    Worried About Running Out of Food in the Last Year: Never true    Ran Out of Food in the Last Year: Never true  Transportation Needs: No Transportation Needs (01/02/2023)   PRAPARE - Administrator, Civil Service (Medical): No    Lack of Transportation (Non-Medical): No  Physical Activity: Sufficiently Active (01/02/2023)   Exercise Vital Sign    Days of Exercise per Week: 3 days    Minutes of Exercise per Session: 60 min  Stress: No Stress Concern Present (01/02/2023)   [employer] of Occupational Health - Occupational Stress Questionnaire    Feeling of Stress : Not at all  Social Connections: Socially Isolated (01/02/2023)   Social Connection and Isolation Panel [NHANES]    Frequency of Communication with Friends and  Family: Once a week    Frequency of Social Gatherings with Friends and Family: Once a week    Attends Religious Services: Never    Database administrator or Organizations: No    Attends Engineer, structural: Never    Marital Status: Married    Tobacco Counseling Counseling given: Not Answered   Clinical Intake:  Pre-visit preparation completed: Yes  Pain : 0-10 Pain Score: 4  Pain Type: Chronic pain, Neuropathic pain Pain Location: Back Pain Orientation: Lower     BMI - recorded: 24.96 Nutritional Status: BMI of 19-24  Normal Nutritional Risks: None Diabetes: No  How  often do you need to have someone help you when you read instructions, pamphlets, or other written materials from your doctor or pharmacy?: 1 - Never What is the last grade level you completed in school?: COLLEGE  Interpreter Needed?: No  Information entered by :: Susie Cassette, LPN.   Activities of Daily Living    01/02/2023   11:48 AM 12/29/2022    9:19 AM  In your present state of health, do you have any difficulty performing the following activities:  Hearing? 0 0  Vision? 0 0  Difficulty concentrating or making decisions? 0 0  Walking or climbing stairs? 0 0  Dressing or bathing? 0 0  Doing errands, shopping? 0 0  Preparing Food and eating ? N N  Using the Toilet? N N  In the past six months, have you accidently leaked urine? N N  Do you have problems with loss of bowel control? N N  Managing your Medications? N N  Managing your Finances? N N  Housekeeping or managing your Housekeeping? N N    Patient Care Team: [name], MD as PCP - General (Internal Medicine)  Indicate any recent Medical Services you may have received from other than [facility] providers in the past year (date may be approximate).     Assessment:   This is a routine wellness examination for Kacey.  Hearing/Vision screen Hearing Screening - Comments:: Denies hearing difficulties   Vision Screening -  Comments:: Wears rx glasses - up to date with routine eye exams with [facility]  Dietary issues and exercise activities discussed:     Goals Addressed   None   Depression Screen    01/02/2023   11:44 AM 10/23/2022    9:24 AM 08/16/2022    9:54 AM 07/05/2022    1:00 PM 01/16/2022    8:46 AM 10/06/2021   11:32 AM 08/26/2020    9:54 AM  PHQ 2/9 Scores  PHQ - 2 Score 0 0 0 0 0 0 0  PHQ- 9 Score 0  1        Fall Risk    01/02/2023   11:47 AM 12/29/2022    9:19 AM 10/23/2022    9:24 AM 08/16/2022    9:54 AM 07/05/2022    1:00 PM  Fall Risk   Falls in the past year? 0 0 0 0 0  Number falls in past yr: 0  0 0 0  Injury with Fall? 0 0 0 0 0  Risk for fall due to : No Fall Risks    No Fall Risks  Follow up Falls prevention discussed    Falls evaluation completed    MEDICARE RISK AT HOME: Medicare Risk at Home Any stairs in or around the home?: Yes If so, are there any without handrails?: No Home free of loose throw rugs in walkways, pet beds, electrical cords, etc?: Yes Adequate lighting in your home to reduce risk of falls?: Yes Life alert?: No Use of a cane, walker or w/c?: No Grab bars in the bathroom?: No Shower chair or bench in shower?: No Elevated toilet seat or a handicapped toilet?: No  TIMED UP AND GO:  Was the test performed?  No    Cognitive Function:        01/02/2023   11:48 AM 01/16/2022    8:49 AM 01/09/2021   10:39 AM  6CIT Screen  What Year? 0 points 0 points 0 points  What month? 0 points 0 points 0 points  What time? 0 points 0 points 0 points  Count back from 20 0 points 0 points 0 points  Months in reverse 0 points 0 points 0 points  Repeat phrase 0 points 0 points 2 points  Total Score 0 points 0 points 2 points    Immunizations Immunization History  Administered Date(s) Administered   COVID-19, mRNA, vaccine(Comirnaty)12 years and older 02/07/2022   Fluad Quad(high Dose 65+) 01/12/2022   Influenza, High Dose Seasonal PF 12/21/2022    Influenza,inj,Quad PF,6+ Mos 01/12/2022   Influenza-Unspecified 12/31/2019   PFIZER(Purple Top)SARS-COV-2 Vaccination 02/27/2020, 08/02/2020   PNEUMOCOCCAL CONJUGATE-20 08/26/2020   Pfizer Covid-19 Vaccine Bivalent Booster 1yrs & up 08/31/2021   Pneumococcal Polysaccharide-23 05/01/2018   Respiratory Syncytial Virus Vaccine,Recomb Aduvanted(Arexvy) 02/07/2022   Tdap 06/24/2021   Zoster Recombinant(Shingrix) 06/03/2021, 08/08/2021    TDAP status: Up to date  Flu Vaccine status: Up to date  Pneumococcal vaccine status: Up to date  Covid-19 vaccine status: Information provided on how to obtain vaccines.   Qualifies for Shingles Vaccine? Yes   Zostavax completed Yes   Shingrix Completed?: Yes  Screening Tests Health Maintenance  Topic Date Due   COVID-19 Vaccine (5 - 2023-24 season) 12/31/2022   Medicare Annual Wellness (AWV)  01/02/2024   Colonoscopy  04/13/2025   DTaP/Tdap/Td (2 - Td or Tdap) 06/25/2031   Pneumonia Vaccine 103+ Years old  Completed   INFLUENZA VACCINE  Completed   Hepatitis C Screening  Completed   Zoster Vaccines- Shingrix  Completed   HPV VACCINES  Aged Out    Health Maintenance  Health Maintenance Due  Topic Date Due   COVID-19 Vaccine (5 - 2023-24 season) 12/31/2022    Colorectal cancer screening: No longer required.   Lung Cancer Screening: (Low Dose CT Chest recommended if Age 58-80 years, 20 pack-year currently smoking OR have quit w/in 15years.) does not qualify.   Lung Cancer Screening Referral: no  Additional Screening:  Hepatitis C Screening: does qualify; Completed 01/09/2021  Vision Screening: Recommended annual ophthalmology exams for early detection of glaucoma and other disorders of the eye. Is the patient up to date with their annual eye exam?  Yes  Who is the provider or what is the name of the office in which the patient attends annual eye exams? [facility] If pt is not established with a provider, would they like to  be referred to a provider to establish care? No .   Dental Screening: Recommended annual dental exams for proper oral hygiene  Diabetic Foot Exam: N/A  Community Resource Referral / Chronic Care Management: CRR required this visit?  No   CCM required this visit?  No     Plan:     I have personally reviewed and noted the following in the patient's chart:   Medical and social history Use of alcohol, tobacco or illicit drugs  Current medications and supplements including opioid prescriptions. Patient is not currently taking opioid prescriptions. Functional ability and status Nutritional status Physical activity Advanced directives List of other physicians Hospitalizations, surgeries, and ER visits in previous 12 months Vitals Screenings to include cognitive, depression, and falls Referrals and appointments  In addition, I have reviewed and discussed with patient certain preventive protocols, quality metrics, and best practice recommendations. A written personalized care plan for preventive services as well as general preventive health recommendations were provided to patient.     Mickeal Needy, LPN   06/30/9516   After Visit Summary: (MyChart) Due to this being a telephonic  visit, the after visit summary with patients personalized plan was offered to patient via MyChart   Nurse Notes: Patient is cognitive intact via telephone conversation with this nurse.

## 2023-01-02 NOTE — Patient Instructions (Signed)
[name] , Thank you for taking time to come for your Medicare Wellness Visit. I appreciate your ongoing commitment to your health goals. Please review the following plan we discussed and let me know if I can assist you in the future.   Referrals/Orders/Follow-Ups/Clinician Recommendations: no  This is a list of the screening recommended for you and due dates:  Health Maintenance  Topic Date Due   COVID-19 Vaccine (5 - 2023-24 season) 12/31/2022   Medicare Annual Wellness Visit  01/02/2024   Colon Cancer Screening  04/13/2025   DTaP/Tdap/Td vaccine (2 - Td or Tdap) 06/25/2031   Pneumonia Vaccine  Completed   Flu Shot  Completed   Hepatitis C Screening  Completed   Zoster (Shingles) Vaccine  Completed   HPV Vaccine  Aged Out    Advanced directives: (Copy Requested) Please bring a copy of your health care power of attorney and living will to the office to be added to your chart at your convenience.  Next Medicare Annual Wellness Visit scheduled for next year: Yes  Preventive Care attachment FALL PREVENTION attachment

## 2023-01-08 ENCOUNTER — Ambulatory Visit (INDEPENDENT_AMBULATORY_CARE_PROVIDER_SITE_OTHER): Payer: Medicare HMO

## 2023-01-08 ENCOUNTER — Ambulatory Visit (INDEPENDENT_AMBULATORY_CARE_PROVIDER_SITE_OTHER): Payer: Medicare HMO | Admitting: Internal Medicine

## 2023-01-08 ENCOUNTER — Encounter: Payer: Self-pay | Admitting: Internal Medicine

## 2023-01-08 VITALS — BP 124/70 | HR 69 | Temp 97.8°F | Ht 69.0 in | Wt 168.0 lb

## 2023-01-08 DIAGNOSIS — R072 Precordial pain: Secondary | ICD-10-CM | POA: Diagnosis not present

## 2023-01-08 DIAGNOSIS — E538 Deficiency of other specified B group vitamins: Secondary | ICD-10-CM | POA: Diagnosis not present

## 2023-01-08 DIAGNOSIS — N400 Enlarged prostate without lower urinary tract symptoms: Secondary | ICD-10-CM | POA: Diagnosis not present

## 2023-01-08 DIAGNOSIS — R052 Subacute cough: Secondary | ICD-10-CM | POA: Diagnosis not present

## 2023-01-08 DIAGNOSIS — Z0001 Encounter for general adult medical examination with abnormal findings: Secondary | ICD-10-CM

## 2023-01-08 DIAGNOSIS — I10 Essential (primary) hypertension: Secondary | ICD-10-CM

## 2023-01-08 DIAGNOSIS — Z0000 Encounter for general adult medical examination without abnormal findings: Secondary | ICD-10-CM

## 2023-01-08 LAB — BASIC METABOLIC PANEL
BUN: 14 mg/dL (ref 6–23)
CO2: 31 meq/L (ref 19–32)
Calcium: 10 mg/dL (ref 8.4–10.5)
Chloride: 100 meq/L (ref 96–112)
Creatinine, Ser: 0.87 mg/dL (ref 0.40–1.50)
GFR: 86.71 mL/min (ref >60.00)
Glucose, Bld: 95 mg/dL (ref 70–99)
Potassium: 4.2 meq/L (ref 3.5–5.1)
Sodium: 139 meq/L (ref 135–145)

## 2023-01-08 LAB — URINALYSIS, ROUTINE W REFLEX MICROSCOPIC
Bilirubin Urine: NEGATIVE
Hgb urine dipstick: NEGATIVE
Ketones, ur: NEGATIVE
Leukocytes,Ua: NEGATIVE
Nitrite: NEGATIVE
RBC / HPF: NONE SEEN (ref 0–?)
Specific Gravity, Urine: <=1.005 — AB (ref 1.000–1.030)
Total Protein, Urine: NEGATIVE
Urine Glucose: NEGATIVE
Urobilinogen, UA: 0.2 (ref 0.0–1.0)
WBC, UA: NONE SEEN (ref 0–?)
pH: 6 (ref 5.0–8.0)

## 2023-01-08 LAB — PSA: PSA: 1.88 ng/mL (ref 0.10–4.00)

## 2023-01-08 LAB — CBC WITH DIFFERENTIAL/PLATELET
Basophils Absolute: 0 10*3/uL (ref 0.0–0.1)
Basophils Relative: 0.5 % (ref 0.0–3.0)
Eosinophils Absolute: 0.2 10*3/uL (ref 0.0–0.7)
Eosinophils Relative: 2.4 % (ref 0.0–5.0)
HCT: 44 % (ref 39.0–52.0)
Hemoglobin: 14.8 g/dL (ref 13.0–17.0)
Lymphocytes Relative: 30.5 % (ref 12.0–46.0)
Lymphs Abs: 2.3 10*3/uL (ref 0.7–4.0)
MCHC: 33.7 g/dL (ref 30.0–36.0)
MCV: 88.9 fl (ref 78.0–100.0)
Monocytes Absolute: 0.6 10*3/uL (ref 0.1–1.0)
Monocytes Relative: 8.6 % (ref 3.0–12.0)
Neutro Abs: 4.3 10*3/uL (ref 1.4–7.7)
Neutrophils Relative %: 58 % (ref 43.0–77.0)
Platelets: 263 10*3/uL (ref 150.0–400.0)
RBC: 4.95 Mil/uL (ref 4.22–5.81)
RDW: 13.4 % (ref 11.5–15.5)
WBC: 7.5 10*3/uL (ref 4.0–10.5)

## 2023-01-08 LAB — TROPONIN I (HIGH SENSITIVITY): High Sens Troponin I: 4 ng/L (ref 2–17)

## 2023-01-08 LAB — FOLATE: Folate: >24.2 ng/mL (ref >5.9)

## 2023-01-08 MED ORDER — CYANOCOBALAMIN 1000 MCG/ML IJ SOLN
1000.0000 ug | Freq: Once | INTRAMUSCULAR | Status: AC
Start: 2023-01-08 — End: 2023-01-08
  Administered 2023-01-08: 1000 ug via INTRAMUSCULAR

## 2023-01-08 NOTE — Patient Instructions (Signed)
Health Maintenance, Male Adopting a healthy lifestyle and getting preventive care are important in promoting health and wellness. Ask your health care provider about: The right schedule for you to have regular tests and exams. Things you can do on your own to prevent diseases and keep yourself healthy. What should I know about diet, weight, and exercise? Eat a healthy diet  Eat a diet that includes plenty of vegetables, fruits, low-fat dairy products, and lean protein. Do not eat a lot of foods that are high in solid fats, added sugars, or sodium. Maintain a healthy weight Body mass index (BMI) is a measurement that can be used to identify possible weight problems. It estimates body fat based on height and weight. Your health care provider can help determine your BMI and help you achieve or maintain a healthy weight. Get regular exercise Get regular exercise. This is one of the most important things you can do for your health. Most adults should: Exercise for at least 150 minutes each week. The exercise should increase your heart rate and make you sweat (moderate-intensity exercise). Do strengthening exercises at least twice a week. This is in addition to the moderate-intensity exercise. Spend less time sitting. Even light physical activity can be beneficial. Watch cholesterol and blood lipids Have your blood tested for lipids and cholesterol at 71 years of age, then have this test every 5 years. You may need to have your cholesterol levels checked more often if: Your lipid or cholesterol levels are high. You are older than 71 years of age. You are at high risk for heart disease. What should I know about cancer screening? Many types of cancers can be detected early and may often be prevented. Depending on your health history and family history, you may need to have cancer screening at various ages. This may include screening for: Colorectal cancer. Prostate cancer. Skin cancer. Lung  cancer. What should I know about heart disease, diabetes, and high blood pressure? Blood pressure and heart disease High blood pressure causes heart disease and increases the risk of stroke. This is more likely to develop in people who have high blood pressure readings or are overweight. Talk with your health care provider about your target blood pressure readings. Have your blood pressure checked: Every 3-5 years if you are 18-39 years of age. Every year if you are 40 years old or older. If you are between the ages of 65 and 75 and are a current or former smoker, ask your health care provider if you should have a one-time screening for abdominal aortic aneurysm (AAA). Diabetes Have regular diabetes screenings. This checks your fasting blood sugar level. Have the screening done: Once every three years after age 45 if you are at a normal weight and have a low risk for diabetes. More often and at a younger age if you are overweight or have a high risk for diabetes. What should I know about preventing infection? Hepatitis B If you have a higher risk for hepatitis B, you should be screened for this virus. Talk with your health care provider to find out if you are at risk for hepatitis B infection. Hepatitis C Blood testing is recommended for: Everyone born from 1945 through 1965. Anyone with known risk factors for hepatitis C. Sexually transmitted infections (STIs) You should be screened each year for STIs, including gonorrhea and chlamydia, if: You are sexually active and are younger than 71 years of age. You are older than 71 years of age and your   health care provider tells you that you are at risk for this type of infection. Your sexual activity has changed since you were last screened, and you are at increased risk for chlamydia or gonorrhea. Ask your health care provider if you are at risk. Ask your health care provider about whether you are at high risk for HIV. Your health care provider  may recommend a prescription medicine to help prevent HIV infection. If you choose to take medicine to prevent HIV, you should first get tested for HIV. You should then be tested every 3 months for as long as you are taking the medicine. Follow these instructions at home: Alcohol use Do not drink alcohol if your health care provider tells you not to drink. If you drink alcohol: Limit how much you have to 0-2 drinks a day. Know how much alcohol is in your drink. In the [place] one drink equals one 12 oz bottle of beer (355 mL), one 5 oz glass of wine (148 mL), or one 1 oz glass of hard liquor (44 mL). Lifestyle Do not use any products that contain nicotine or tobacco. These products include cigarettes, chewing tobacco, and vaping devices, such as e-cigarettes. If you need help quitting, ask your health care provider. Do not use street drugs. Do not share needles. Ask your health care provider for help if you need support or information about quitting drugs. General instructions Schedule regular health, dental, and eye exams. Stay current with your vaccines. Tell your health care provider if: You often feel depressed. You have ever been abused or do not feel safe at home. Summary Adopting a healthy lifestyle and getting preventive care are important in promoting health and wellness. Follow your health care provider's instructions about healthy diet, exercising, and getting tested or screened for diseases. Follow your health care provider's instructions on monitoring your cholesterol and blood pressure. This information is not intended to replace advice given to you by your health care provider. Make sure you discuss any questions you have with your health care provider. Document Revised: 09/06/2020 Document Reviewed: 09/06/2020 Elsevier Patient Education  2024 [employer]  

## 2023-01-08 NOTE — Progress Notes (Unsigned)
Subjective:  Patient ID: Andrew Stuart, male    DOB: 30-May-1950  Age: 72 y.o. MRN: [id number]  CC: Cough, Annual Exam, and Hypertension   HPI Andrew Stuart presents for a CPX and f/up ----  Discussed the use of AI scribe software for clinical note transcription with the patient, who gave verbal consent to proceed.  History of Present Illness   The patient, with a history of prostate issues and musculoskeletal pain, reports an increase in urinary frequency, particularly at night. They attribute this to an increased intake of water and decaffeinated beverages. They deny any changes in urine stream, dysuria, or dizziness.  They are scheduled for two back surgeries due to numbness in the lower back and legs, and spinal compression at the fifth and sixth vertebrae. The patient reports increased back discomfort, particularly in the mornings, and has had to limit physical activities. Despite this, they continue to walk on a treadmill a few times a week without any reported chest pain, shortness of breath, or dizziness.  The patient also reports occasional chest discomfort, particularly after physical exertion such as trimming shrubs or light workouts. This discomfort is localized to the front of the chest and sometimes radiates to the back of the shoulder blades. They deny any associated shortness of breath. The patient has not seen a cardiologist since the previous year.  They also report a dry cough that comes and goes. A stool sample test for blood was negative 3 months ago. They plan to schedule a colonoscopy in the following year, as it will be close to 10 years since their last one.       Outpatient Medications Prior to Visit  Medication Sig Dispense Refill   acetaminophen (TYLENOL) 500 MG tablet Take 500 mg by mouth every 6 (six) hours as needed for moderate pain.     aspirin EC 81 MG tablet Take 1 tablet (81 mg total) by mouth daily. 90 tablet 1   atorvastatin (LIPITOR) 20 MG tablet TAKE  1 TABLET BY MOUTH EVERY DAY 90 tablet 1   diazepam (VALIUM) 5 MG tablet 1 po 30 minutes prior to MRI scan. May repeat x 1 right before MRI scan if needed. You will need a driver. 2 tablet 0   fluticasone (FLONASE) 50 MCG/ACT nasal spray Place into both nostrils daily.     gabapentin (NEURONTIN) 100 MG capsule Take 1 capsule (100 mg total) by mouth 3 (three) times daily. 90 capsule 5   methocarbamol (ROBAXIN) 500 MG tablet Take 1 tablet (500 mg total) by mouth every 8 (eight) hours as needed for muscle spasms. 90 tablet 2   Multiple Vitamin (MULTIVITAMIN WITH MINERALS) TABS tablet Take 1 tablet by mouth daily.     Naproxen Sodium (ALEVE PO) Take by mouth.     No facility-administered medications prior to visit.    ROS Review of Systems  Constitutional: Negative.  Negative for appetite change, diaphoresis, fatigue and unexpected weight change.  HENT: Negative.    Eyes: Negative.   Respiratory:  Positive for cough. Negative for shortness of breath and wheezing.   Cardiovascular:  Positive for chest pain. Negative for palpitations and leg swelling.       3 episodes of precordial CP at rest  Gastrointestinal:  Negative for abdominal pain, constipation, diarrhea, nausea and vomiting.  Endocrine: Negative.   Genitourinary: Negative.  Negative for difficulty urinating.  Musculoskeletal:  Positive for back pain and neck pain. Negative for gait problem and myalgias.  Skin: Negative.  Negative for color change and pallor.  Allergic/Immunologic: Negative.   Neurological: Negative.  Negative for dizziness.  Hematological:  Negative for adenopathy. Does not bruise/bleed easily.  Psychiatric/Behavioral: Negative.      Objective:  BP 124/70 (BP Location: Left Arm, Patient Position: Sitting, Cuff Size: Large)   Pulse 69   Temp 97.8 F (36.6 C) (Oral)   Ht 5\' 9"  (1.753 m)   Wt 168 lb (76.2 kg)   SpO2 97%   BMI 24.81 kg/m   BP Readings from Last 3 Encounters:  01/08/23 124/70  11/09/22 118/82   10/23/22 133/79    Wt Readings from Last 3 Encounters:  01/08/23 168 lb (76.2 kg)  01/02/23 169 lb (76.7 kg)  11/09/22 173 lb 3.2 oz (78.6 kg)    Physical Exam Vitals reviewed.  Constitutional:      Appearance: Normal appearance.  HENT:     Mouth/Throat:     Mouth: Mucous membranes are moist.  Eyes:     General: No scleral icterus.    Conjunctiva/sclera: Conjunctivae normal.  Cardiovascular:     Rate and Rhythm: Normal rate and regular rhythm.     Heart sounds: Normal heart sounds, S1 normal and S2 normal. No murmur heard.    No gallop.     Comments: EKG- NSR, 66 bpm Septal infarct pattern is old No LVH or acute ST/T wave changes Unchanged Pulmonary:     Effort: Pulmonary effort is normal.     Breath sounds: No stridor. No wheezing, rhonchi or rales.  Abdominal:     General: Abdomen is flat.     Palpations: There is no mass.     Tenderness: There is no abdominal tenderness. There is no guarding.     Hernia: No hernia is present.  Musculoskeletal:     Cervical back: Neck supple.     Right lower leg: No edema.     Left lower leg: No edema.  Lymphadenopathy:     Cervical: No cervical adenopathy.  Skin:    General: Skin is warm and dry.  Neurological:     General: No focal deficit present.     Mental Status: He is alert. Mental status is at baseline.  Psychiatric:        Mood and Affect: Mood normal.        Behavior: Behavior normal.     Lab Results  Component Value Date   WBC 7.5 01/08/2023   HGB 14.8 01/08/2023   HCT 44.0 01/08/2023   PLT 263.0 01/08/2023   GLUCOSE 95 01/08/2023   CHOL 185 07/05/2022   TRIG 150.0 (H) 07/05/2022   HDL 57.70 07/05/2022   LDLCALC 98 07/05/2022   ALT 21 07/05/2022   AST 24 07/05/2022   NA 139 01/08/2023   K 4.2 01/08/2023   CL 100 01/08/2023   CREATININE 0.87 01/08/2023   BUN 14 01/08/2023   CO2 31 01/08/2023   TSH 2.03 10/06/2021   PSA 1.88 01/08/2023   HGBA1C 5.9 10/06/2021    DG Chest 2 View  Result Date:  01/08/2023 CLINICAL DATA:  Chest pain, cough EXAM: CHEST - 2 VIEW COMPARISON:  None Available. FINDINGS: The heart size and mediastinal contours are within normal limits. Both lungs are clear. 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 IMPRESSION: 1. No acute abnormality of the lungs. 2. 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 Electronically Signed  By: Jearld Lesch M.D.   On: 01/08/2023 14:47     Assessment & Plan:  Primary hypertension -     EKG 12-Lead -     Basic metabolic panel; Future -     CBC with Differential/Platelet; Future -     Urinalysis, Routine w reflex microscopic; Future  Encounter for general adult medical examination with abnormal findings  BPH without obstruction/lower urinary tract symptoms -     PSA; Future -     Urinalysis, Routine w reflex microscopic; Future  Vitamin B12 deficiency -     Cyanocobalamin -     Folate; Future -     CBC with Differential/Platelet; Future  Precordial pain -     Troponin I (High Sensitivity); Future -     DG Chest 2 View; Future  Subacute cough -     DG Chest 2 View; Future     Follow-up: Return in about 6 months (around 07/08/2023).  Sanda Linger, MD

## 2023-01-10 NOTE — Patient Instructions (Addendum)
Your procedure is scheduled on:01-24-23 Wednesday Report to the Registration Desk on the 1st floor of the [facility].Then proceed to the 2nd floor Surgery Desk To find out your arrival time, please call [phone] between 1PM - 3PM on:01-23-23 Tuesday If your arrival time is 6:00 am, do not arrive before that time as the [facility] entrance doors do not open until 6:00 am.  REMEMBER: Instructions that are not followed completely may result in serious medical risk, up to and including death; or upon the discretion of your surgeon and anesthesiologist your surgery may need to be rescheduled.  Do not eat food after midnight the night before surgery.  No gum chewing or hard candies.  You may however, drink CLEAR liquids up to 2 hours before you are scheduled to arrive for your surgery. Do not drink anything within 2 hours of your scheduled arrival time.  Clear liquids include: - water  - apple juice without pulp - gatorade (not RED colors) - black coffee or tea (Do NOT add milk or creamers to the coffee or tea) Do NOT drink anything that is not on this list  One week prior to surgery:Last dose on 01-16-23 Stop ANY OVER THE COUNTER supplement/vitamins 7 days prior to surgery (Multivitamin)   Continue taking all prescribed medications with the exception of the following: -Aspirin-Stop 7 days prior to surgery-Last dose will be on 01-16-23 Tuesday  TAKE ONLY THESE MEDICATIONS THE MORNING OF SURGERY WITH A SIP OF WATER: -atorvastatin (LIPITOR)  -gabapentin (NEURONTIN)   No Alcohol for 24 hours before or after surgery.  No Smoking including e-cigarettes for 24 hours before surgery.  No chewable tobacco products for at least 6 hours before surgery.  No nicotine patches on the day of surgery.  Do not use any "recreational" drugs for at least a week (preferably 2 weeks) before your surgery.  Please be advised that the combination of cocaine and anesthesia may have negative outcomes, up  to and including death. If you test positive for cocaine, your surgery will be cancelled.  On the morning of surgery brush your teeth with toothpaste and water, you may rinse your mouth with mouthwash if you wish. Do not swallow any toothpaste or mouthwash.  Use CHG Soap as directed on instruction sheet.  Do not wear jewelry, make-up, hairpins, clips or nail polish.  For welded (permanent) jewelry: bracelets, anklets, waist bands, etc.  Please have this removed prior to surgery.  If it is not removed, there is a chance that hospital personnel will need to cut it off on the day of surgery.  Do not wear lotions, powders, or perfumes.   Do not shave body hair from the neck down 48 hours before surgery.  Contact lenses, hearing aids and dentures may not be worn into surgery.  Do not bring valuables to the hospital. [facility] is not responsible for any missing/lost belongings or valuables. .   Notify your doctor if there is any change in your medical condition (cold, fever, infection).  Wear comfortable clothing (specific to your surgery type) to the hospital.  After surgery, you can help prevent lung complications by doing breathing exercises.  Take deep breaths and cough every 1-2 hours. Your doctor may order a device called an Incentive Spirometer to help you take deep breaths. When coughing or sneezing, hold a pillow firmly against your incision with both hands. This is called "splinting." Doing this helps protect your incision. It also decreases belly discomfort.  If you are  being admitted to the hospital overnight, leave your suitcase in the car. After surgery it may be brought to your room.  In case of increased patient census, it may be necessary for you, the patient, to continue your postoperative care in the Same Day Surgery department.  If you are being discharged the day of surgery, you will not be allowed to drive home. You will need a responsible individual to drive you  home and stay with you for 24 hours after surgery.   If you are taking public transportation, you will need to have a responsible individual with you.  Please call the Pre-admissions Testing Dept. at [phone] if you have any questions about these instructions.  Surgery Visitation Policy:  Patients having surgery or a procedure may have two visitors.  Children under the age of 22 must have an adult with them who is not the patient.  Inpatient Visitation:    Visiting hours are 7 a.m. to 8 p.m. Up to four visitors are allowed at one time in a patient room. The visitors may rotate out with other people during the day.  One visitor age 33 or older may stay with the patient overnight and must be in the room by 8 p.m.    Pre-operative 5 CHG Bath Instructions   You can play a key role in reducing the risk of infection after surgery. Your skin needs to be as free of germs as possible. You can reduce the number of germs on your skin by washing with CHG (chlorhexidine gluconate) soap before surgery. CHG is an antiseptic soap that kills germs and continues to kill germs even after washing.   DO NOT use if you have an allergy to chlorhexidine/CHG or antibacterial soaps. If your skin becomes reddened or irritated, stop using the CHG and notify one of our RNs at [phone].   Please shower with the CHG soap starting 4 days before surgery using the following schedule:     Please keep in mind the following:  DO NOT shave, including legs and underarms, starting the day of your first shower.   You may shave your face at any point before/day of surgery.  Place clean sheets on your bed the day you start using CHG soap. Use a clean washcloth (not used since being washed) for each shower. DO NOT sleep with pets once you start using the CHG.   CHG Shower Instructions:  If you choose to wash your hair and private area, wash first with your normal shampoo/soap.  After you use shampoo/soap, rinse  your hair and body thoroughly to remove shampoo/soap residue.  Turn the water OFF and apply about 3 tablespoons (45 ml) of CHG soap to a CLEAN washcloth.  Apply CHG soap ONLY FROM YOUR NECK DOWN TO YOUR TOES (washing for 3-5 minutes)  DO NOT use CHG soap on face, private areas, open wounds, or sores.  Pay special attention to the area where your surgery is being performed.  If you are having back surgery, having someone wash your back for you may be helpful. Wait 2 minutes after CHG soap is applied, then you may rinse off the CHG soap.  Pat dry with a clean towel  Put on clean clothes/pajamas   If you choose to wear lotion, please use ONLY the CHG-compatible lotions on the back of this paper.     Additional instructions for the day of surgery: DO NOT APPLY any lotions, deodorants, cologne, or perfumes.   Put on clean/comfortable  clothes.  Brush your teeth.  Ask your nurse before applying any prescription medications to the skin.      CHG Compatible Lotions   Aveeno Moisturizing lotion  Cetaphil Moisturizing Cream  Cetaphil Moisturizing Lotion  Clairol Herbal Essence Moisturizing Lotion, Dry Skin  Clairol Herbal Essence Moisturizing Lotion, Extra Dry Skin  Clairol Herbal Essence Moisturizing Lotion, Normal Skin  Curel Age Defying Therapeutic Moisturizing Lotion with Alpha Hydroxy  Curel Extreme Care Body Lotion  Curel Soothing Hands Moisturizing Hand Lotion  Curel Therapeutic Moisturizing Cream, Fragrance-Free  Curel Therapeutic Moisturizing Lotion, Fragrance-Free  Curel Therapeutic Moisturizing Lotion, Original Formula  Eucerin Daily Replenishing Lotion  Eucerin Dry Skin Therapy Plus Alpha Hydroxy Crme  Eucerin Dry Skin Therapy Plus Alpha Hydroxy Lotion  Eucerin Original Crme  Eucerin Original Lotion  Eucerin Plus Crme Eucerin Plus Lotion  Eucerin TriLipid Replenishing Lotion  Keri Anti-Bacterial Hand Lotion  Keri Deep Conditioning Original Lotion Dry Skin Formula Softly  Scented  Keri Deep Conditioning Original Lotion, Fragrance Free Sensitive Skin Formula  Keri Lotion Fast Absorbing Fragrance Free Sensitive Skin Formula  Keri Lotion Fast Absorbing Softly Scented Dry Skin Formula  Keri Original Lotion  Keri Skin Renewal Lotion Keri Silky Smooth Lotion  Keri Silky Smooth Sensitive Skin Lotion  Nivea Body Creamy Conditioning Oil  Nivea Body Extra Enriched Teacher, adult education Moisturizing Lotion Nivea Crme  Nivea Skin Firming Lotion  NutraDerm 30 Skin Lotion  NutraDerm Skin Lotion  NutraDerm Therapeutic Skin Cream  NutraDerm Therapeutic Skin Lotion  ProShield Protective Hand Cream  Provon moisturizing lotion

## 2023-01-11 ENCOUNTER — Other Ambulatory Visit: Payer: Medicare HMO

## 2023-01-11 ENCOUNTER — Encounter
Admission: RE | Admit: 2023-01-11 | Discharge: 2023-01-11 | Disposition: A | Discharge status: Home / Self Care | Payer: Medicare HMO | Source: Ambulatory Visit | Attending: Neurosurgery | Admitting: Neurosurgery

## 2023-01-11 VITALS — BP 140/72 | HR 73 | Ht 69.0 in | Wt 167.5 lb

## 2023-01-11 DIAGNOSIS — Z01812 Encounter for preprocedural laboratory examination: Secondary | ICD-10-CM | POA: Insufficient documentation

## 2023-01-11 DIAGNOSIS — Z01818 Encounter for other preprocedural examination: Secondary | ICD-10-CM

## 2023-01-11 HISTORY — DX: Asymptomatic varicose veins of bilateral lower extremities: I83.93

## 2023-01-11 HISTORY — DX: Hyperglycemia, unspecified: R73.9

## 2023-01-11 HISTORY — DX: Spinal stenosis, lumbar region with neurogenic claudication: M48.062

## 2023-01-11 HISTORY — DX: Venous insufficiency (chronic) (peripheral): I87.2

## 2023-01-11 HISTORY — DX: Abnormal electrocardiogram (ECG) (EKG): R94.31

## 2023-01-11 HISTORY — DX: Carpal tunnel syndrome, left upper limb: G56.02

## 2023-01-11 HISTORY — DX: Cauda equina syndrome: G83.4

## 2023-01-11 HISTORY — DX: Elevated blood-pressure reading, without diagnosis of hypertension: R03.0

## 2023-01-11 HISTORY — DX: Idiopathic progressive neuropathy: G60.3

## 2023-01-11 HISTORY — DX: Radiculopathy, lumbar region: M54.16

## 2023-01-11 HISTORY — DX: Deficiency of other specified B group vitamins: E53.8

## 2023-01-11 HISTORY — DX: Spinal stenosis, cervical region: M48.02

## 2023-01-11 HISTORY — DX: Benign prostatic hyperplasia without lower urinary tract symptoms: N40.0

## 2023-01-11 HISTORY — DX: Personal history of urinary calculi: Z87.442

## 2023-01-11 LAB — TYPE AND SCREEN
ABO/RH(D): A NEG
Antibody Screen: NEGATIVE

## 2023-01-11 LAB — SURGICAL PCR SCREEN
MRSA, PCR: NEGATIVE
Staphylococcus aureus: NEGATIVE

## 2023-01-11 NOTE — Pre-Procedure Instructions (Signed)
Dr Zoila Shutter instructions for pts neck surgery is for pt to stop 81 mg asa 7 days prior to surgery. Pt states that he stopped his aspirin 1 month ago due to having some issues. Pt states he is not going to start his 81 mg asa back

## 2023-01-19 ENCOUNTER — Other Ambulatory Visit: Payer: Self-pay | Admitting: Internal Medicine

## 2023-01-19 DIAGNOSIS — E785 Hyperlipidemia, unspecified: Secondary | ICD-10-CM

## 2023-01-23 MED ORDER — CEFAZOLIN IN SODIUM CHLORIDE 2-0.9 GM/100ML-% IV SOLN
2.0000 g | Freq: Once | INTRAVENOUS | Status: DC
  Filled 2023-01-23: qty 100

## 2023-01-23 MED ORDER — CHLORHEXIDINE GLUCONATE 0.12 % MT SOLN
15.0000 mL | Freq: Once | OROMUCOSAL | Status: AC
  Administered 2023-01-24: 15 mL via OROMUCOSAL

## 2023-01-23 MED ORDER — ORAL CARE MOUTH RINSE: 15.0000 mL | Freq: Once | OROMUCOSAL | Status: AC

## 2023-01-23 MED ORDER — CEFAZOLIN SODIUM-DEXTROSE 2-4 GM/100ML-% IV SOLN
2.0000 g | INTRAVENOUS | Status: AC
  Administered 2023-01-24: 2 g via INTRAVENOUS

## 2023-01-23 MED ORDER — LACTATED RINGERS IV SOLN: INTRAVENOUS | Status: DC

## 2023-01-23 MED ORDER — FAMOTIDINE 20 MG PO TABS
20.0000 mg | ORAL_TABLET | Freq: Once | ORAL | Status: AC
  Administered 2023-01-24: 20 mg via ORAL

## 2023-01-24 ENCOUNTER — Other Ambulatory Visit: Payer: Self-pay

## 2023-01-24 ENCOUNTER — Inpatient Hospital Stay: Payer: Medicare HMO

## 2023-01-24 ENCOUNTER — Encounter: Payer: Self-pay | Admitting: Neurosurgery

## 2023-01-24 ENCOUNTER — Encounter
Admission: RE | Disposition: A | Discharge status: Home / Self Care | Payer: Self-pay | Source: Home / Self Care | Attending: Neurosurgery

## 2023-01-24 ENCOUNTER — Inpatient Hospital Stay
Admission: RE | Admit: 2023-01-24 | Discharge: 2023-01-25 | DRG: 519 | Disposition: A | Discharge status: Home / Self Care | Payer: Medicare HMO | Attending: Neurosurgery | Admitting: Neurosurgery

## 2023-01-24 ENCOUNTER — Inpatient Hospital Stay: Payer: Medicare HMO | Admitting: Urgent Care

## 2023-01-24 DIAGNOSIS — M4712 Other spondylosis with myelopathy, cervical region: Secondary | ICD-10-CM | POA: Diagnosis present

## 2023-01-24 DIAGNOSIS — Z01818 Encounter for other preprocedural examination: Secondary | ICD-10-CM

## 2023-01-24 DIAGNOSIS — Z800 Family history of malignant neoplasm of digestive organs: Secondary | ICD-10-CM | POA: Diagnosis not present

## 2023-01-24 DIAGNOSIS — Z882 Allergy status to sulfonamides status: Secondary | ICD-10-CM | POA: Diagnosis not present

## 2023-01-24 DIAGNOSIS — Z8249 Family history of ischemic heart disease and other diseases of the circulatory system: Secondary | ICD-10-CM | POA: Diagnosis not present

## 2023-01-24 DIAGNOSIS — Z881 Allergy status to other antibiotic agents status: Secondary | ICD-10-CM

## 2023-01-24 DIAGNOSIS — Z87442 Personal history of urinary calculi: Secondary | ICD-10-CM | POA: Diagnosis not present

## 2023-01-24 DIAGNOSIS — G959 Disease of spinal cord, unspecified: Secondary | ICD-10-CM | POA: Diagnosis present

## 2023-01-24 DIAGNOSIS — E785 Hyperlipidemia, unspecified: Secondary | ICD-10-CM | POA: Diagnosis present

## 2023-01-24 DIAGNOSIS — Z91018 Allergy to other foods: Secondary | ICD-10-CM | POA: Diagnosis not present

## 2023-01-24 DIAGNOSIS — G603 Idiopathic progressive neuropathy: Secondary | ICD-10-CM | POA: Diagnosis present

## 2023-01-24 DIAGNOSIS — M1990 Unspecified osteoarthritis, unspecified site: Secondary | ICD-10-CM | POA: Diagnosis present

## 2023-01-24 DIAGNOSIS — N400 Enlarged prostate without lower urinary tract symptoms: Secondary | ICD-10-CM | POA: Diagnosis present

## 2023-01-24 DIAGNOSIS — G9589 Other specified diseases of spinal cord: Principal | ICD-10-CM

## 2023-01-24 DIAGNOSIS — Z83438 Family history of other disorder of lipoprotein metabolism and other lipidemia: Secondary | ICD-10-CM

## 2023-01-24 DIAGNOSIS — Z833 Family history of diabetes mellitus: Secondary | ICD-10-CM

## 2023-01-24 DIAGNOSIS — Z801 Family history of malignant neoplasm of trachea, bronchus and lung: Secondary | ICD-10-CM

## 2023-01-24 DIAGNOSIS — Z8261 Family history of arthritis: Secondary | ICD-10-CM | POA: Diagnosis not present

## 2023-01-24 DIAGNOSIS — Z91030 Bee allergy status: Secondary | ICD-10-CM | POA: Diagnosis not present

## 2023-01-24 DIAGNOSIS — M4802 Spinal stenosis, cervical region: Principal | ICD-10-CM

## 2023-01-24 LAB — ABO/RH: ABO/RH(D): A NEG

## 2023-01-24 SURGERY — LUMBAR LAMINECTOMY/DECOMPRESSION MICRODISCECTOMY 1 LEVEL
Anesthesia: General

## 2023-01-24 SURGERY — CERVICAL LAMINOPLASTY
Anesthesia: General | Site: Back

## 2023-01-24 MED ORDER — FLUTICASONE PROPIONATE 50 MCG/ACT NA SUSP: 1.0000 | NASAL | Status: DC | PRN

## 2023-01-24 MED ORDER — SODIUM CHLORIDE (PF) 0.9 % IJ SOLN
INTRAMUSCULAR | Status: DC | PRN
  Administered 2023-01-24: 60 mL

## 2023-01-24 MED ORDER — PROPOFOL 1000 MG/100ML IV EMUL
INTRAVENOUS | Status: AC
  Filled 2023-01-24: qty 100

## 2023-01-24 MED ORDER — ONDANSETRON HCL 4 MG/2ML IJ SOLN
INTRAMUSCULAR | Status: AC
  Filled 2023-01-24: qty 2

## 2023-01-24 MED ORDER — ONDANSETRON HCL 4 MG PO TABS: 4.0000 mg | ORAL_TABLET | Freq: Four times a day (QID) | ORAL | Status: DC | PRN

## 2023-01-24 MED ORDER — BUPIVACAINE-EPINEPHRINE (PF) 0.5% -1:200000 IJ SOLN
INTRAMUSCULAR | Status: DC | PRN
  Administered 2023-01-24: 10 mL

## 2023-01-24 MED ORDER — GLYCOPYRROLATE 0.2 MG/ML IJ SOLN
INTRAMUSCULAR | Status: DC | PRN
  Administered 2023-01-24: .2 mg via INTRAVENOUS

## 2023-01-24 MED ORDER — MAGNESIUM OXIDE -MG SUPPLEMENT 400 (240 MG) MG PO TABS
200.0000 mg | ORAL_TABLET | Freq: Every day | ORAL | Status: DC
  Administered 2023-01-24 – 2023-01-25 (×2): 200 mg via ORAL
  Filled 2023-01-24 (×2): qty 1

## 2023-01-24 MED ORDER — SUCCINYLCHOLINE CHLORIDE 200 MG/10ML IV SOSY
PREFILLED_SYRINGE | INTRAVENOUS | Status: AC
  Filled 2023-01-24: qty 10

## 2023-01-24 MED ORDER — REMIFENTANIL HCL 1 MG IV SOLR
INTRAVENOUS | Status: AC
  Filled 2023-01-24: qty 1000

## 2023-01-24 MED ORDER — OXYCODONE HCL 5 MG PO TABS: 10.0000 mg | ORAL_TABLET | ORAL | Status: DC | PRN

## 2023-01-24 MED ORDER — DEXAMETHASONE SODIUM PHOSPHATE 10 MG/ML IJ SOLN
INTRAMUSCULAR | Status: AC
  Filled 2023-01-24: qty 1

## 2023-01-24 MED ORDER — LIDOCAINE HCL (PF) 2 % IJ SOLN
INTRAMUSCULAR | Status: AC
  Filled 2023-01-24: qty 5

## 2023-01-24 MED ORDER — SENNA 8.6 MG PO TABS
1.0000 | ORAL_TABLET | Freq: Two times a day (BID) | ORAL | Status: DC
  Administered 2023-01-24 – 2023-01-25 (×3): 8.6 mg via ORAL
  Filled 2023-01-24 (×3): qty 1

## 2023-01-24 MED ORDER — ACETAMINOPHEN 10 MG/ML IV SOLN
INTRAVENOUS | Status: AC
  Filled 2023-01-24: qty 100

## 2023-01-24 MED ORDER — DOCUSATE SODIUM 100 MG PO CAPS
100.0000 mg | ORAL_CAPSULE | Freq: Two times a day (BID) | ORAL | Status: DC
  Administered 2023-01-24 – 2023-01-25 (×3): 100 mg via ORAL
  Filled 2023-01-24 (×3): qty 1

## 2023-01-24 MED ORDER — SODIUM CHLORIDE 0.9 % IV SOLN: 250.0000 mL | INTRAVENOUS | Status: DC

## 2023-01-24 MED ORDER — SODIUM CHLORIDE 0.9% FLUSH
3.0000 mL | Freq: Two times a day (BID) | INTRAVENOUS | Status: DC
  Administered 2023-01-24 – 2023-01-25 (×3): 3 mL via INTRAVENOUS

## 2023-01-24 MED ORDER — SURGIFLO WITH THROMBIN (HEMOSTATIC MATRIX KIT) OPTIME
TOPICAL | Status: DC | PRN
  Administered 2023-01-24: 3 via TOPICAL

## 2023-01-24 MED ORDER — SODIUM CHLORIDE 0.9 % IV SOLN: INTRAVENOUS | Status: DC

## 2023-01-24 MED ORDER — LIDOCAINE HCL (CARDIAC) PF 100 MG/5ML IV SOSY
PREFILLED_SYRINGE | INTRAVENOUS | Status: DC | PRN
  Administered 2023-01-24: 80 mg via INTRAVENOUS

## 2023-01-24 MED ORDER — METHOCARBAMOL 500 MG PO TABS: 500.0000 mg | ORAL_TABLET | Freq: Four times a day (QID) | ORAL | Status: DC | PRN

## 2023-01-24 MED ORDER — OXYCODONE HCL 5 MG PO TABS: 5.0000 mg | ORAL_TABLET | ORAL | Status: DC | PRN

## 2023-01-24 MED ORDER — DEXAMETHASONE SODIUM PHOSPHATE 10 MG/ML IJ SOLN
INTRAMUSCULAR | Status: DC | PRN
  Administered 2023-01-24: 10 mg via INTRAVENOUS

## 2023-01-24 MED ORDER — MORPHINE SULFATE (PF) 2 MG/ML IV SOLN: 1.0000 mg | INTRAVENOUS | Status: AC | PRN

## 2023-01-24 MED ORDER — SUCCINYLCHOLINE CHLORIDE 200 MG/10ML IV SOSY
PREFILLED_SYRINGE | INTRAVENOUS | Status: DC | PRN
  Administered 2023-01-24: 100 mg via INTRAVENOUS

## 2023-01-24 MED ORDER — FAMOTIDINE 20 MG PO TABS
ORAL_TABLET | ORAL | Status: AC
  Filled 2023-01-24: qty 1

## 2023-01-24 MED ORDER — 0.9 % SODIUM CHLORIDE (POUR BTL) OPTIME
TOPICAL | Status: DC | PRN
  Administered 2023-01-24: 500 mL

## 2023-01-24 MED ORDER — PHENYLEPHRINE HCL-NACL 20-0.9 MG/250ML-% IV SOLN
INTRAVENOUS | Status: AC
  Filled 2023-01-24: qty 250

## 2023-01-24 MED ORDER — MENTHOL 3 MG MT LOZG: 1.0000 | LOZENGE | OROMUCOSAL | Status: DC | PRN

## 2023-01-24 MED ORDER — PHENOL 1.4 % MT LIQD: 1.0000 | OROMUCOSAL | Status: DC | PRN

## 2023-01-24 MED ORDER — FENTANYL CITRATE (PF) 100 MCG/2ML IJ SOLN
INTRAMUSCULAR | Status: DC | PRN
  Administered 2023-01-24 (×2): 50 ug via INTRAVENOUS

## 2023-01-24 MED ORDER — SODIUM CHLORIDE (PF) 0.9 % IJ SOLN
INTRAMUSCULAR | Status: AC
  Filled 2023-01-24: qty 20

## 2023-01-24 MED ORDER — ACETAMINOPHEN 325 MG PO TABS: 650.0000 mg | ORAL_TABLET | ORAL | Status: DC | PRN

## 2023-01-24 MED ORDER — BUPIVACAINE HCL (PF) 0.5 % IJ SOLN
INTRAMUSCULAR | Status: AC
  Filled 2023-01-24: qty 30

## 2023-01-24 MED ORDER — KETOROLAC TROMETHAMINE 15 MG/ML IJ SOLN
7.5000 mg | Freq: Four times a day (QID) | INTRAMUSCULAR | Status: AC
  Administered 2023-01-24 – 2023-01-25 (×3): 7.5 mg via INTRAVENOUS
  Filled 2023-01-24 (×3): qty 1

## 2023-01-24 MED ORDER — BUPIVACAINE LIPOSOME 1.3 % IJ SUSP
INTRAMUSCULAR | Status: AC
  Filled 2023-01-24: qty 20

## 2023-01-24 MED ORDER — MIDAZOLAM HCL 2 MG/2ML IJ SOLN
INTRAMUSCULAR | Status: AC
  Filled 2023-01-24: qty 2

## 2023-01-24 MED ORDER — ACETAMINOPHEN 650 MG RE SUPP: 650.0000 mg | RECTAL | Status: DC | PRN

## 2023-01-24 MED ORDER — SODIUM CHLORIDE 0.9 % IV SOLN
INTRAVENOUS | Status: DC | PRN
  Administered 2023-01-24: .05 ug/kg/min via INTRAVENOUS

## 2023-01-24 MED ORDER — SODIUM CHLORIDE 0.9% FLUSH: 3.0000 mL | INTRAVENOUS | Status: DC | PRN

## 2023-01-24 MED ORDER — ONDANSETRON HCL 4 MG/2ML IJ SOLN: 4.0000 mg | Freq: Four times a day (QID) | INTRAMUSCULAR | Status: DC | PRN

## 2023-01-24 MED ORDER — PROPOFOL 10 MG/ML IV BOLUS
INTRAVENOUS | Status: DC | PRN
  Administered 2023-01-24: 150 mg via INTRAVENOUS

## 2023-01-24 MED ORDER — DROPERIDOL 2.5 MG/ML IJ SOLN: 0.6250 mg | Freq: Once | INTRAMUSCULAR | Status: DC | PRN

## 2023-01-24 MED ORDER — KETOROLAC TROMETHAMINE 15 MG/ML IJ SOLN
INTRAMUSCULAR | Status: AC
  Filled 2023-01-24: qty 1

## 2023-01-24 MED ORDER — ONDANSETRON HCL 4 MG/2ML IJ SOLN
INTRAMUSCULAR | Status: DC | PRN
  Administered 2023-01-24: 4 mg via INTRAVENOUS

## 2023-01-24 MED ORDER — FENTANYL CITRATE (PF) 100 MCG/2ML IJ SOLN
INTRAMUSCULAR | Status: AC
  Filled 2023-01-24: qty 2

## 2023-01-24 MED ORDER — LACTATED RINGERS IV SOLN: INTRAVENOUS | Status: DC | PRN

## 2023-01-24 MED ORDER — PHENYLEPHRINE HCL-NACL 20-0.9 MG/250ML-% IV SOLN
INTRAVENOUS | Status: DC | PRN
Start: 2023-01-24 — End: 2023-01-24
  Administered 2023-01-24: 35 ug/min via INTRAVENOUS

## 2023-01-24 MED ORDER — GABAPENTIN 100 MG PO CAPS
100.0000 mg | ORAL_CAPSULE | Freq: Every evening | ORAL | Status: DC
  Administered 2023-01-24: 100 mg via ORAL
  Filled 2023-01-24: qty 1

## 2023-01-24 MED ORDER — ATORVASTATIN CALCIUM 20 MG PO TABS
20.0000 mg | ORAL_TABLET | Freq: Every day | ORAL | Status: DC
  Administered 2023-01-24 – 2023-01-25 (×2): 20 mg via ORAL
  Filled 2023-01-24 (×2): qty 1

## 2023-01-24 MED ORDER — POLYETHYLENE GLYCOL 3350 17 G PO PACK: 17.0000 g | PACK | Freq: Every day | ORAL | Status: DC | PRN

## 2023-01-24 MED ORDER — FENTANYL CITRATE (PF) 100 MCG/2ML IJ SOLN
25.0000 ug | INTRAMUSCULAR | Status: DC | PRN
  Administered 2023-01-24 (×2): 25 ug via INTRAVENOUS

## 2023-01-24 MED ORDER — MIDAZOLAM HCL 2 MG/2ML IJ SOLN
INTRAMUSCULAR | Status: DC | PRN
  Administered 2023-01-24: 1 mg via INTRAVENOUS

## 2023-01-24 MED ORDER — CEFAZOLIN SODIUM-DEXTROSE 2-4 GM/100ML-% IV SOLN
INTRAVENOUS | Status: AC
  Filled 2023-01-24: qty 100

## 2023-01-24 MED ORDER — ENOXAPARIN SODIUM 40 MG/0.4ML IJ SOSY
40.0000 mg | PREFILLED_SYRINGE | INTRAMUSCULAR | Status: DC
  Administered 2023-01-25: 40 mg via SUBCUTANEOUS
  Filled 2023-01-24: qty 0.4

## 2023-01-24 MED ORDER — PROPOFOL 500 MG/50ML IV EMUL
INTRAVENOUS | Status: DC | PRN
Start: 2023-01-24 — End: 2023-01-24
  Administered 2023-01-24: 100 ug/kg/min via INTRAVENOUS

## 2023-01-24 MED ORDER — PROPOFOL 10 MG/ML IV BOLUS
INTRAVENOUS | Status: AC
  Filled 2023-01-24: qty 20

## 2023-01-24 MED ORDER — METHOCARBAMOL 1000 MG/10ML IJ SOLN
500.0000 mg | Freq: Four times a day (QID) | INTRAVENOUS | Status: DC | PRN
  Administered 2023-01-24: 500 mg via INTRAVENOUS
  Filled 2023-01-24: qty 500

## 2023-01-24 MED ORDER — CHLORHEXIDINE GLUCONATE 0.12 % MT SOLN
OROMUCOSAL | Status: AC
  Filled 2023-01-24: qty 15

## 2023-01-24 MED ORDER — ACETAMINOPHEN 500 MG PO TABS
1000.0000 mg | ORAL_TABLET | Freq: Four times a day (QID) | ORAL | Status: DC
  Administered 2023-01-24 – 2023-01-25 (×4): 1000 mg via ORAL
  Filled 2023-01-24 (×4): qty 2

## 2023-01-24 MED ORDER — MAGNESIUM CITRATE PO SOLN: 1.0000 | Freq: Once | ORAL | Status: DC | PRN

## 2023-01-24 MED ORDER — IRRISEPT - 450ML BOTTLE WITH 0.05% CHG IN STERILE WATER, USP 99.95% OPTIME
TOPICAL | Status: DC | PRN
Start: 2023-01-24 — End: 2023-01-24
  Administered 2023-01-24: 450 mL

## 2023-01-24 MED ORDER — BISACODYL 10 MG RE SUPP: 10.0000 mg | Freq: Every day | RECTAL | Status: DC | PRN

## 2023-01-24 MED ORDER — BUPIVACAINE-EPINEPHRINE (PF) 0.5% -1:200000 IJ SOLN
INTRAMUSCULAR | Status: AC
  Filled 2023-01-24: qty 10

## 2023-01-24 MED ORDER — ACETAMINOPHEN 10 MG/ML IV SOLN
INTRAVENOUS | Status: DC | PRN
  Administered 2023-01-24: 1000 mg via INTRAVENOUS

## 2023-01-24 SURGICAL SUPPLY — 58 items
ADH SKN CLS APL DERMABOND .7 (GAUZE/BANDAGES/DRESSINGS) ×1
AGENT HMST KT MTR STRL THRMB (HEMOSTASIS) ×3
APL PRP STRL LF DISP 70% ISPRP (MISCELLANEOUS) ×1
BASIN KIT SINGLE STR (MISCELLANEOUS) ×1 IMPLANT
BUR NEURO DRILL SOFT 3.0X3.8M (BURR) ×1 IMPLANT
CHLORAPREP W/TINT 26 (MISCELLANEOUS) ×1 IMPLANT
DERMABOND ADVANCED .7 DNX12 (GAUZE/BANDAGES/DRESSINGS) ×1 IMPLANT
DRAPE C ARM PK CFD 31 SPINE (DRAPES) ×1 IMPLANT
DRAPE LAPAROTOMY 100X77 ABD (DRAPES) ×1 IMPLANT
DRAPE MICROSCOPE SPINE 48X150 (DRAPES) ×1 IMPLANT
DRSG OPSITE POSTOP 3X4 (GAUZE/BANDAGES/DRESSINGS) ×1 IMPLANT
DRSG OPSITE POSTOP 4X8 (GAUZE/BANDAGES/DRESSINGS) IMPLANT
DRSG TEGADERM 4X4.75 (GAUZE/BANDAGES/DRESSINGS) IMPLANT
ELECT CAUTERY BLADE TIP 2.5 (TIP)
ELECTRODE CAUTERY BLDE TIP 2.5 (TIP) IMPLANT
EVACUATOR 1/8 PVC DRAIN (DRAIN) IMPLANT
FEE INTRAOP CADWELL SUPPLY NCS (MISCELLANEOUS) IMPLANT
FEE INTRAOP MONITOR IMPULS NCS (MISCELLANEOUS) IMPLANT
GLOVE BIOGEL PI IND STRL 6.5 (GLOVE) ×1 IMPLANT
GLOVE SURG SYN 6.5 ES PF (GLOVE) ×1
GLOVE SURG SYN 6.5 PF PI (GLOVE) ×1 IMPLANT
GLOVE SURG SYN 8.5 E (GLOVE) ×3
GLOVE SURG SYN 8.5 PF PI (GLOVE) ×3 IMPLANT
GOWN SRG LRG LVL 4 IMPRV REINF (GOWNS) ×1 IMPLANT
GOWN SRG XL LVL 3 NONREINFORCE (GOWNS) ×1 IMPLANT
GOWN STRL NON-REIN TWL XL LVL3 (GOWNS) ×1
GOWN STRL REIN LRG LVL4 (GOWNS) ×1
INTRAOP CADWELL SUPPLY FEE NCS (MISCELLANEOUS) ×1
INTRAOP DISP SUPPLY FEE NCS (MISCELLANEOUS) ×1
INTRAOP MONITOR FEE IMPULS NCS (MISCELLANEOUS) ×1
JET LAVAGE IRRISEPT WOUND (IRRIGATION / IRRIGATOR) ×1
KIT PREVENA INCISION MGT 13 (CANNISTER) ×1 IMPLANT
KIT SPINAL PRONEVIEW (KITS) ×1 IMPLANT
LAVAGE JET IRRISEPT WOUND (IRRIGATION / IRRIGATOR) ×1 IMPLANT
MANIFOLD NEPTUNE II (INSTRUMENTS) ×1 IMPLANT
MARKER SKIN DUAL TIP RULER LAB (MISCELLANEOUS) ×1 IMPLANT
NDL SAFETY ECLIP 18X1.5 (MISCELLANEOUS) ×1 IMPLANT
NS IRRIG 1000ML POUR BTL (IV SOLUTION) ×1 IMPLANT
PACK LAMINECTOMY ARMC (PACKS) ×1 IMPLANT
PAD ARMBOARD 7.5X6 YLW CONV (MISCELLANEOUS) ×1 IMPLANT
PATTIES SURGICAL 1X1 (DISPOSABLE) IMPLANT
PLATE BN 5XSHLF INLN SPNE CNP (Plate) IMPLANT
PLATE CANOPY SHELF INLINE 5 (Plate) ×1 IMPLANT
PLATE CANOPY SHELF INLINE 7 (Plate) IMPLANT
SCREW SELF DRILL RELIEVE 4 (Screw) IMPLANT
SCREW SELF DRILL RELIEVE 6 (Screw) IMPLANT
STAPLER SKIN PROX 35W (STAPLE) ×2 IMPLANT
SURGIFLO W/THROMBIN 8M KIT (HEMOSTASIS) ×1 IMPLANT
SUT DVC VLOC 3-0 CL 6 P-12 (SUTURE) IMPLANT
SUT ETHILON 3-0 FS-10 30 BLK (SUTURE) ×1
SUT VIC AB 0 CT1 27 (SUTURE) ×2
SUT VIC AB 0 CT1 27XCR 8 STRN (SUTURE) IMPLANT
SUT VIC AB 2-0 CT1 18 (SUTURE) ×1 IMPLANT
SUTURE EHLN 3-0 FS-10 30 BLK (SUTURE) IMPLANT
TAPE CLOTH 3X10 WHT NS LF (GAUZE/BANDAGES/DRESSINGS) ×2 IMPLANT
TOWEL OR 17X26 4PK STRL BLUE (TOWEL DISPOSABLE) ×2 IMPLANT
TRAP FLUID SMOKE EVACUATOR (MISCELLANEOUS) ×1 IMPLANT
WATER STERILE IRR 1000ML POUR (IV SOLUTION) ×2 IMPLANT

## 2023-01-24 NOTE — Discharge Instructions (Signed)
Your surgeon has performed an operation on your cervical spine (neck) to relieve pressure on the spinal cord and/or nerves.   The following are instructions to help in your recovery once you have been discharged from the hospital. Even if you feel well, it is important that you follow these activity guidelines. If you do not let your neck heal properly from the surgery, you can increase the chance of return of your symptoms and other complications.  Activity    No bending, lifting, or twisting ("BLT"). Avoid lifting objects heavier than 10 pounds (gallon milk jug).  Where possible, avoid household activities that involve lifting, bending, reaching, pushing, or pulling such as laundry, vacuuming, grocery shopping, and childcare. Try to arrange for help from friends and family for these activities while your back heals.  Increase physical activity slowly as tolerated.  Taking short walks is encouraged, but avoid strenuous exercise. Do not jog, run, bicycle, lift weights, or participate in any other exercises unless specifically allowed by your doctor.  Talk to your doctor before resuming sexual activity.  You should not drive until cleared by your doctor.  Until released by your doctor, you should not return to work or school.  You should rest at home and let your body heal.   You may shower three days after your surgery.  After showering, lightly dab your incision dry. Do not take a tub bath or go swimming until approved by your doctor at your follow-up appointment.  If your doctor ordered a cervical collar (neck brace) for you, you should wear it whenever you are out of bed. You may remove it when lying down or sleeping, but you should wear it at all other times. Not all neck surgeries require a cervical collar.  If you smoke, we strongly recommend that you quit.  Smoking has been proven to interfere with normal bone healing and will dramatically reduce the success rate of your surgery. Please  contact [facility] (800-QUIT-NOW) and use the resources at www.[facility].com for assistance in stopping smoking.  Surgical Incision   You may remove the dressing on 01/27/2023.  If you have staples or stitches on your incision, you should have a follow up scheduled for removal. Diet           You may return to your usual diet. However, you may experience discomfort when swallowing in the first month after your surgery. This is normal. You may find that softer foods are more comfortable for you to swallow. Be sure to stay hydrated.  When to Contact us  You may experience pain in your neck and/or pain between your shoulder blades. This is normal and should improve in the next few weeks with the help of pain medication, muscle relaxers, and rest. Some patients report that a warm compress on the back of the neck or between the shoulder blades helps.  However, should you experience any of the following, contact us immediately: New numbness or weakness Pain that is progressively getting worse, and is not relieved by your pain medication, muscle relaxers, rest, and warm compresses Bleeding, redness, swelling, pain, or drainage from surgical incision Chills or flu-like symptoms Fever greater than 101.0 F (38.3 C) Inability to eat, drink fluids, or take medications Problems with bowel or bladder functions Difficulty breathing or shortness of breath Warmth, tenderness, or swelling in your calf Contact Information How to contact us:  If you have any questions/concerns before or after surgery, you can reach [place] at [phone], or you can send  a [employer]. We can be reached by phone or mychart 8am-4pm, Monday-Friday.  *Please note: Calls after 4pm are forwarded to a third party answering service. Mychart messages are not routinely monitored during evenings, weekends, and holidays. Please call our office to contact the answering service for urgent concerns during non-business hours.

## 2023-01-24 NOTE — Plan of Care (Signed)
Problem: Education: Goal: Ability to verbalize activity precautions or restrictions will improve Outcome: Progressing

## 2023-01-24 NOTE — Anesthesia Procedure Notes (Signed)
Procedure Name: Intubation Date/Time: 01/24/2023 10:39 AM  Performed by: Malva Cogan, CRNAPre-anesthesia Checklist: Patient identified, Patient being monitored, Timeout performed, Emergency Drugs available and Suction available Patient Re-evaluated:Patient Re-evaluated prior to induction Oxygen Delivery Method: Circle system utilized Preoxygenation: Pre-oxygenation with 100% oxygen Induction Type: IV induction Ventilation: Mask ventilation without difficulty Laryngoscope Size: 4 and McGraph Grade View: Grade I Tube type: Oral Tube size: 7.0 mm Number of attempts: 1 Airway Equipment and Method: Stylet and Video-laryngoscopy Placement Confirmation: ETT inserted through vocal cords under direct vision, positive ETCO2 and breath sounds checked- equal and bilateral Secured at: 24 cm Tube secured with: Tape Dental Injury: Teeth and Oropharynx as per pre-operative assessment

## 2023-01-24 NOTE — Plan of Care (Signed)

## 2023-01-24 NOTE — Op Note (Signed)
Indications: [name] is a 72 y.o. 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  He had symptomatic myelopathy prompting surgical intervention.  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   Preoperative Note:   Risks of surgery discussed include: infection, bleeding, stroke, coma, death, paralysis, CSF leak, nerve/spinal cord injury, numbness, tingling, weakness, complex regional pain syndrome, recurrent stenosis and/or disc herniation, vascular injury, development of instability, neck/back pain, need for further surgery, persistent symptoms, development of deformity, and the risks of anesthesia. The patient understood these risks and agreed to proceed.  Operative Note:   OPERATIVE PROCEDURE:  1. Posterior cervical laminoplasty C3-7 with left-sided C3-5 foraminotomies and left-sided C6-7 foraminotomies.   2.  Use of fluoroscopy   OPERATIVE PROCEDURE:  After induction of general anesthesia, the patient was placed in the prone position on the operative table.  A midline incision was then planned using fluoroscopy.  A timeout was performed, and antibiotics given.  Next, the posterior cervical region was prepped and draped in the usual sterile fashion. The incision was injected with local anesthetic, then opened sharply. A subperiosteal dissection was then carried out to expose the posterior elements from C2 and C6, with careful attention paid to maintaining the C2/3 facet capsule.  After satisfactory exposure had been obtained, we moved to performing a laminoplasty.  On the right side of the patient, the lamina from C3-C7 inclusive was scored with a partial thickness cut.  On the left  side, full-thickness cut was made at the junction between the lamina and the lateral mass.  Using a 1 mm Kerrison punch, the ligamentum and soft tissues were removed from the full cut on the left side.  This allowed the lamina to be mobilized to expand the spinal canal.  Before placing the laminoplasty plates, the drill was used to expand the laminoforaminotomy at C3-4, C4-5, and C6-7 on the left.  Using a 1 mm punch, medial facetectomy and foraminotomy was then performed at each level.  The small nerve hook was used to confirm decompression.  At this point, each level was sized for laminoplasty plates.  At C3-C6, 7 mm laminoplasty plates were placed and secured using screws.  At C7, a 5 mm plate was used.  It was also secured using laminoplasty screws.  Final x-rays were taken.  No complication was noted.  After decompression was complete, final AP and lateral radiographs were taken.  The wound was copiously irrigated and hemostasis was achieved.  A Hemovac drain was then placed in the wound deep to the fascia.   The wound was closed in a multilayer fashion using interrupted 0 and 2-0 Vicryl sutures.  The final skin edges were reapproximated using a 3-0 monocryl and staples.  A dress was placed.  After closure, the patient was flipped supine. Patient was then handed back over to anesthesia.  All counts were correct at the conclusion of the procedure.  Neurological monitoring was used throughout, and there were no changes.  Andrew Charity PA acted as an Designer, television/film set throughout the case. An assistant was required for this procedure due to the complexity.  The assistant provided assistance in tissue manipulation and suction, and was required for the successful and safe performance of the procedure. I performed the critical portions of the  procedure.   Andrew Night MD

## 2023-01-24 NOTE — Transfer of Care (Signed)
Immediate Anesthesia Transfer of Care Note  Patient: Andrew Stuart  Procedure(s) Performed: C3-7 LAMINOPLASTY (Back)  Patient Location: PACU  Anesthesia Type:General  Level of Consciousness: drowsy  Airway & Oxygen Therapy: Patient Spontanous Breathing and Patient connected to face mask oxygen  Post-op Assessment: Report given to RN and Post -op Vital signs reviewed and stable  Post vital signs: Reviewed and stable  Last Vitals:  Vitals Value Taken Time  BP 134/82 01/24/23 1349  Temp    Pulse 82 01/24/23 1352  Resp 13 01/24/23 1352  SpO2 100 % 01/24/23 1352  Vitals shown include unfiled device data.  Last Pain:  Vitals:   01/24/23 0922  TempSrc: Tympanic  PainSc: 0-No pain         Complications: No notable events documented.

## 2023-01-24 NOTE — Anesthesia Preprocedure Evaluation (Signed)
Anesthesia Evaluation  Patient identified by MRN, date of birth, ID band Patient awake    Reviewed: Allergy & Precautions, H&P , NPO status , Patient's Chart, lab work & pertinent test results, reviewed documented beta blocker date and time   History of Anesthesia Complications Negative for: history of anesthetic complications  Airway Mallampati: II  TM Distance: >3 FB Neck ROM: full    Dental  (+) Dental Advidsory Given, Caps, Missing   Pulmonary neg pulmonary ROS, Continuous Positive Airway Pressure Ventilation    Pulmonary exam normal breath sounds clear to auscultation       Cardiovascular Exercise Tolerance: Good negative cardio ROS Normal cardiovascular exam Rhythm:regular Rate:Normal     Neuro/Psych neg Seizures  Neuromuscular disease  negative psych ROS   GI/Hepatic negative GI ROS, Neg liver ROS,,,  Endo/Other  negative endocrine ROS    Renal/GU Renal disease (kidney stone)  negative genitourinary   Musculoskeletal   Abdominal   Peds  Hematology negative hematology ROS (+)   Anesthesia Other Findings Past Medical History: No date: Abnormal EKG No date: Allergy No date: Arthritis No date: BPH (benign prostatic hyperplasia) No date: Carpal tunnel syndrome, left No date: Cauda equina compression (HCC) No date: Cervical stenosis of spine     Comment:  severe C3-4 No date: Chronic venous insufficiency No date: Elevated blood pressure reading No date: Hepatitis No date: History of kidney stones No date: Hyperglycemia No date: Hyperlipidemia No date: Idiopathic progressive neuropathy No date: Right lumbar radiculitis No date: Spinal stenosis, lumbar region with neurogenic claudication No date: Varicose veins of both lower extremities No date: Vitamin B 12 deficiency   Reproductive/Obstetrics negative OB ROS                             Anesthesia Physical Anesthesia  Plan  ASA: 2  Anesthesia Plan: General   Post-op Pain Management:    Induction: Intravenous  PONV Risk Score and Plan: 2 and Ondansetron, Dexamethasone and Treatment may vary due to age or medical condition  Airway Management Planned: Oral ETT  Additional Equipment:   Intra-op Plan:   Post-operative Plan: Extubation in OR  Informed Consent: I have reviewed the patients History and Physical, chart, labs and discussed the procedure including the risks, benefits and alternatives for the proposed anesthesia with the patient or authorized representative who has indicated his/her understanding and acceptance.     Dental Advisory Given  Plan Discussed with: Anesthesiologist, CRNA and Surgeon  Anesthesia Plan Comments:        Anesthesia Quick Evaluation

## 2023-01-24 NOTE — H&P (Signed)
Referring Physician:  No referring provider defined for this encounter.  Primary Physician:  Andrew Grandchild, MD  History of Present Illness: 01/24/2023 [name] presents today for surgical intervention.  11/09/2022 [name] is here today with a chief complaint of weakness in his right leg and some difficulty with walking.  He also gets pain and numbness into his legs prickly when he walks.  Activity makes it worse.  He sometimes has to sit down to help with his discomfort.  Heat and Aleve also help.  He has right lateral hip pain as well as posterior thigh pain.  He has bilateral calf cramping intermittently.  He gets numbness in his right lateral calf as well.  His legs feel tight in the morning but get better as he loosens up.  His husband reports that he is not as active as he previously was.  He is also noted that he walks bent forward compared to prior.   He reports arm numbness when he bends his head back.   Bowel/Bladder Dysfunction: none  Conservative measures:  Physical therapy:   has participated in from 08/23/22 to 10/18/22 for his balance and back Multimodal medical therapy including regular antiinflammatories: gabapentin, methocarbamol, naproxen  Injections: has not received epidural steroid injections  Past Surgery: denies  Andrew Stuart has some symptoms of cervical myelopathy.  The symptoms are causing a significant impact on the patient's life.   I have utilized the care everywhere function in epic to review the outside records available from external health systems.  Review of Systems:  A 10 point review of systems is negative, except for the pertinent positives and negatives detailed in the HPI.  Past Medical History: Past Medical History:  Diagnosis Date   Abnormal EKG    Allergy    Arthritis    BPH (benign prostatic hyperplasia)    Carpal tunnel syndrome, left    Cauda equina compression (HCC)    Cervical stenosis of spine    severe  C3-4   Chronic venous insufficiency    Elevated blood pressure reading    Hepatitis    History of kidney stones    Hyperglycemia    Hyperlipidemia    Idiopathic progressive neuropathy    Right lumbar radiculitis    Spinal stenosis, lumbar region with neurogenic claudication    Varicose veins of both lower extremities    Vitamin B 12 deficiency     Past Surgical History: Past Surgical History:  Procedure Laterality Date   CARPAL TUNNEL RELEASE Right 2015   CARPAL TUNNEL RELEASE Left 2021   COLONOSCOPY     ENDOVENOUS ABLATION SAPHENOUS VEIN W/ LASER Left 04/12/2022   endovenous laser ablation left greater saphenous vein and stab phlebectomy < 10 incisions left leg by Cari Caraway MD   HEMORRHOID SURGERY     HERNIA REPAIR     left foot surgery     TONSILLECTOMY      Allergies: Allergies as of 12/01/2022 - Review Complete 11/09/2022  Allergen Reaction Noted   Bactrim [sulfamethoxazole-trimethoprim] Rash 03/30/2021   Azithromycin Swelling 01/04/2022   Bee pollen  04/28/2020   Blueberry flavor  04/28/2020   Strawberry extract  04/28/2020    Medications:  Current Facility-Administered Medications:    ceFAZolin (ANCEF) IVPB 2g/100 mL premix, 2 g, Intravenous, 60 min Pre-Op, Venetia Night, MD   lactated ringers infusion, , Intravenous, Continuous, Yevette Edwards, MD, Last Rate: 10 mL/hr at 01/24/23 0928, New Bag at 01/24/23 [phone]  Social History: Social History   Tobacco Use   Smoking status: Never    Passive exposure: Never   Smokeless tobacco: Never  Vaping Use   Vaping status: Never Used  Substance Use Topics   Alcohol use: Yes    Alcohol/week: 7.0 standard drinks of alcohol    Types: 7 Glasses of wine per week    Comment: occ wine once a month   Drug use: Never    Family Medical History: Family History  Problem Relation Age of Onset   Arthritis Mother    Diabetes Mother    Early death Mother    Heart disease Mother    Hyperlipidemia Mother    Colon  cancer Mother    Early death Father    Early death Sister    Colon cancer Sister    ALS Sister    Early death Brother    Lung cancer Brother    Liver cancer Brother     Physical Examination: Vitals:   01/24/23 0922  BP: (!) 153/81  Pulse: 85  Resp: 18  Temp: (!) 97.5 F (36.4 C)  SpO2: 100%   Heart sounds normal no MRG. Chest Clear to Auscultation Bilaterally.  General: Patient is in no apparent distress. Attention to examination is appropriate.  Neck:   Supple.  Full range of motion.  Respiratory: Patient is breathing without any difficulty.   NEUROLOGICAL:     Awake, alert, oriented to person, place, and time.  Speech is clear and fluent.   Cranial Nerves: Pupils equal round and reactive to light.  Facial tone is symmetric.  Facial sensation is symmetric. Shoulder shrug is symmetric. Tongue protrusion is midline.  There is no pronator drift.  Strength: Side Biceps Triceps Deltoid Interossei Grip Wrist Ext. Wrist Flex.  R 5 5 5 5 5 5 5   L 5 5 5 5 5 5 5    Side Iliopsoas Quads Hamstring PF DF EHL  R 4+ 4+ 5 5 5 5   L 4+ 5 5 5 5 5    Reflexes are 1+ and symmetric at the biceps, triceps, brachioradialis, 3+patella and 2+achilles.   Hoffman's is absent.   Bilateral upper and lower extremity sensation is intact to light touch with exception of his legs which are diminished.    No evidence of dysmetria noted.  Gait is wide-based.     Medical Decision Making  Imaging: MRI L spine 11/04/2022 Disc levels:   T12-L1: Small disc bulge without spinal canal stenosis   L1-L2: Small disc bulge. No spinal canal stenosis. No neural foraminal stenosis.   L2-L3: Small disc bulge with moderate facet hypertrophy. Mild spinal canal stenosis. No neural foraminal stenosis.   L3-L4: Large disc bulge with endplate spurring. Severe spinal canal stenosis. Mild right and moderate left neural foraminal stenosis.   L4-L5: Moderate facet hypertrophy and intermediate sized disc  bulge with endplate spurring. Mild spinal canal stenosis. Moderate right and mild left neural foraminal stenosis.   L5-S1: Small disc bulge. No spinal canal stenosis. No neural foraminal stenosis.   Visualized sacrum: Normal.   IMPRESSION: 1. Severe spinal canal stenosis at L3-L4 with impingement of the cauda equina and moderate left neural foraminal stenosis. 2. Mild spinal canal stenosis at L2-L3 and L4-L5. 3. Moderate right L4-L5 neural foraminal stenosis.     Electronically Signed   By: Deatra Robinson M.D.   On: 11/04/2022 23:53  MRI C spine 12/07/2022 EXAMINATION: MRI cervical spine   CLINICAL INDICATION:Myelopathy   TECHNIQUE: MRI cervical spine  without contrast.   COMPARISON:None   FINDINGS:   The bone marrow signal is normal.   The craniocervical junction is normal.   There is increased signal in the cord due to compression of the cord at the C3-C4 level   C2-C3: There is a mild disc bulge. No significant facet disease. There is no significant spinal stenosis or foraminal stenosis   C3-C4: There is a central disc herniation with compression of the cord. There is facet arthropathy on the left. There are bilateral foraminal spurs with severe bilateral neuroforaminal stenosis   C4-C5: There is a central disc herniation with compression of the cord. There are bilateral foraminal spurs with severe right and moderate left neuroforaminal stenosis   C5-C6: There is a central disc herniation with complete effacement of the thecal sac and slight indention of the cord. Mild facet arthropathy. No significant foraminal stenosis.   C6-C7: There is severe degenerative disc disease. There is a broad-based disc bulge. There is complete effacement of the thecal sac with indention of the cord. There are prominent foraminal spurs with severe bilateral neuroforaminal stenosis   C7-T1: Normal  IMPRESSION:   Cervical spondylosis. There is severe spinal stenosis at C3-4, C4-5, C5-6 and  C6-7. There is compression of the cord at these levels. There is increased signal in the cord at the C3-C4 level.   Multilevel neuroforaminal stenosis as outlined above.   Electronically Signed by: Domingo Dimes, MD on 12/21/2022 12:03 PM    I have personally reviewed the images and agree with the above interpretation.  Assessment and Plan: Mr. Dibert is a pleasant 72 y.o. 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  Additionally, he has some signs and symptoms concerning for possible cervical myelopathy.    He has severe cervical stenosis.  He has symptoms of cervical spondylotic myelopathy.  We discussed that conservative management is not indicated for cervical spondylotic myelopathy.  We will proceed with C3-7 cervical laminoplasty with left C3-5 foraminotomies and left C6-7 foraminotomies.    Venetia Night MD

## 2023-01-25 ENCOUNTER — Encounter: Payer: Medicare HMO | Admitting: Neurosurgery

## 2023-01-25 MED ORDER — SENNA 8.6 MG PO TABS: 1.0000 | ORAL_TABLET | Freq: Two times a day (BID) | ORAL | 0 refills | Status: DC

## 2023-01-25 MED ORDER — METHOCARBAMOL 500 MG PO TABS: 500.0000 mg | ORAL_TABLET | Freq: Three times a day (TID) | ORAL | 2 refills | Status: DC | PRN

## 2023-01-25 MED ORDER — OXYCODONE HCL 5 MG PO TABS: 5.0000 mg | ORAL_TABLET | ORAL | 0 refills | Status: DC | PRN

## 2023-01-25 MED ORDER — CELECOXIB 200 MG PO CAPS
200.0000 mg | ORAL_CAPSULE | Freq: Two times a day (BID) | ORAL | 0 refills | Status: DC
Start: 2023-01-25 — End: 2023-02-21

## 2023-01-25 NOTE — Progress Notes (Signed)
Physical Therapy Treatment Patient Details Name: Andrew Stuart MRN: [id number] DOB: Sep 23, 1950 Today's Date: 01/25/2023   History of Present Illness Patient is s/p Posterior cervical laminoplasty C3-7 with left-sided C3-5 foraminotomies and left-sided C6-7 foraminotomies. No brace needed.    PT Comments  Patient has been up walking in halls with assist. Patient and husband want to practice steps prior to discharge. Patient requires min A for bed mobility. Supervision for transfers and ambulation with RW. Supervision with 6 steps and single rail, reciprocal pattern. If no rails, may need min A. Patient is mobilizing well. He will continue to benefit from skilled PT to improve independence with mobility.       If plan is discharge home, recommend the following: A little help with walking and/or transfers;A little help with bathing/dressing/bathroom;Assistance with cooking/housework;Assist for transportation;Help with stairs or ramp for entrance   Can travel by private vehicle     Yes  Equipment Recommendations  None recommended by PT    Recommendations for Other Services       Precautions / Restrictions Precautions Precautions: Cervical;Fall Precaution Booklet Issued: Yes (comment) Restrictions Weight Bearing Restrictions: No     Mobility  Bed Mobility Overal bed mobility: Modified Independent             General bed mobility comments: Use of rails, HOB raised and cga guard. Instructed in log rolling once home.    Transfers Overall transfer level: Modified independent Equipment used: Rolling walker (2 wheels)                    Ambulation/Gait Ambulation/Gait assistance: Modified independent (Device/Increase time) Gait Distance (Feet): 50 Feet Assistive device: Rolling walker (2 wheels), None Gait Pattern/deviations: Step-through pattern Gait velocity: WNL     General Gait Details: no lob or difficulty ambulating noted- carrying walker more this  afternoon   Stairs Stairs: Yes Stairs assistance: Supervision Stair Management: One rail Right, Alternating pattern, Forwards Number of Stairs: 6 General stair comments: safe on steps with single rail and supervision   Wheelchair Mobility     Tilt Bed    Modified Rankin (Stroke Patients Only)       Balance Overall balance assessment: Modified Independent                                          Cognition Arousal: Alert Behavior During Therapy: WFL for tasks assessed/performed Overall Cognitive Status: Within Functional Limits for tasks assessed                                          Exercises      General Comments        Pertinent Vitals/Pain Pain Assessment Pain Assessment: Faces Faces Pain Scale: Hurts a little bit Pain Location: posterior neck Pain Descriptors / Indicators: Tightness Pain Intervention(s): Monitored during session    Home Living Family/patient expects to be discharged to:: Private residence Living Arrangements: Spouse/significant other             Home Layout: Two level Home Equipment: Agricultural consultant (2 wheels)      Prior Function            PT Goals (current goals can now be found in the care plan section) Acute Rehab PT Goals Patient Stated Goal:  to return home PT Goal Formulation: With patient/family Time For Goal Achievement: 01/29/23 Potential to Achieve Goals: Good Progress towards PT goals: Progressing toward goals    Frequency    Min 1X/week      PT Plan      Co-evaluation              AM-PAC PT "6 Clicks" Mobility   Outcome Measure  Help needed turning from your back to your side while in a flat bed without using bedrails?: A Little Help needed moving from lying on your back to sitting on the side of a flat bed without using bedrails?: A Little Help needed moving to and from a bed to a chair (including a wheelchair)?: None Help needed standing up from a  chair using your arms (e.g., wheelchair or bedside chair)?: None Help needed to walk in hospital room?: A Little Help needed climbing 3-5 steps with a railing? : A Little 6 Click Score: 20    End of Session   Activity Tolerance: Patient tolerated treatment well Patient left: in bed;with call bell/phone within reach;with family/visitor present Nurse Communication: Mobility status PT Visit Diagnosis: Muscle weakness (generalized) (M62.81)     Time: 9604-5409 PT Time Calculation (min) (ACUTE ONLY): 9 min  Charges:    $Gait Training: 8-22 mins PT General Charges $$ ACUTE PT VISIT: 1 Visit                     Ashonti Leandro, PT, GCS 01/25/23,1:48 PM

## 2023-01-25 NOTE — Discharge Summary (Signed)
Physician Discharge Summary  Patient ID: Andrew Stuart MRN: [id number] DOB/AGE: 1950-06-03 72 y.o.  Admit date: 01/24/2023 Discharge date: 01/25/2023  Admission Diagnoses:Principal Problem:   Cervical myelopathy [facility]) Active Problems:   Cervical spinal stenosis   Myelomalacia [facility])  Discharge Diagnoses:  Principal Problem:   Cervical myelopathy (HCC) Active Problems:   Cervical spinal stenosis   Myelomalacia [facility])   Discharged Condition: good  Hospital Course: [name] had surgical intervention for myelopathy.  He was admitted for observation.  He underwent PTOT evaluation and was discharged on POD1.  Consults: None  Significant Diagnostic Studies: radiology: X-Ray: good placement of implants  Treatments: surgery: C3-7 laminoplasty  Discharge Exam: Blood pressure 134/75, pulse 66, temperature 97.9 F (36.6 C), resp. rate 20, height 5\' 9"  (1.753 m), weight 75.8 kg, SpO2 98%. General appearance: alert and cooperative CNI MAEW   Disposition: Discharge disposition: 01-Home or Self Care       Discharge Instructions     Discharge patient   Complete by: As directed    Discharge disposition: 01-Home or Self Care   Discharge patient date: 01/25/2023   Incentive spirometry RT   Complete by: As directed       Allergies as of 01/25/2023       Reactions   Bactrim [sulfamethoxazole-trimethoprim] Rash   Azithromycin Swelling   Bee Pollen    Blueberry Flavor Rash   Strawberry Extract Rash        Medication List     TAKE these medications    acetaminophen 500 MG tablet Commonly known as: TYLENOL Take 500 mg by mouth every 6 (six) hours as needed for moderate pain.   ALEVE PO Take 1 tablet by mouth as needed.   atorvastatin 20 MG tablet Commonly known as: LIPITOR TAKE 1 TABLET BY MOUTH EVERY DAY   celecoxib 200 MG capsule Commonly known as: CeleBREX Take 1 capsule (200 mg total) by mouth 2 (two) times daily.   fluticasone 50 MCG/ACT nasal  spray Commonly known as: FLONASE Place 1 spray into both nostrils as needed for allergies.   gabapentin 100 MG capsule Commonly known as: Neurontin Take 1 capsule (100 mg total) by mouth 3 (three) times daily. What changed: when to take this   MAGNESIUM PO Take 250 mg by mouth daily.   methocarbamol 500 MG tablet Commonly known as: ROBAXIN Take 1 tablet (500 mg total) by mouth every 8 (eight) hours as needed for muscle spasms.   multivitamin with minerals Tabs tablet Take 1 tablet by mouth daily.   oxyCODONE 5 MG immediate release tablet Commonly known as: Oxy IR/ROXICODONE Take 1 tablet (5 mg total) by mouth every 4 (four) hours as needed for moderate pain ((score 4 to 6)).   psyllium 58.6 % packet Commonly known as: METAMUCIL Take 1 packet by mouth daily.   senna 8.6 MG Tabs tablet Commonly known as: SENOKOT Take 1 tablet (8.6 mg total) by mouth 2 (two) times daily.   VITAMIN B1-B12 IJ Inject 1 Dose as directed every 30 (thirty) days.        Follow-up Information     Susanne Borders, PA Follow up on 02/08/2023.   Specialty: Neurosurgery Contact information: [address] [place] [address] [phone]                 Signed: Venetia Night 01/25/2023, 4:53 PM

## 2023-01-25 NOTE — Progress Notes (Signed)
Andrew Stuart to be D/C'd Home per MD order.  Discussed with the patient and all questions fully answered.  VSS, Skin clean, dry and intact without evidence of skin break down, no evidence of skin tears noted. IV catheter discontinued intact. Site without signs and symptoms of complications. Dressing and pressure applied.  An After Visit Summary was printed and given to the patient. Patient prescriptions sent to pharmacy.  D/c education completed with patient/family including follow up instructions, medication list, d/c activities limitations if indicated, with other d/c instructions as indicated by MD - patient able to verbalize understanding, all questions fully answered.   Patient instructed to return to ED, call 911, or call MD for any changes in condition.   Patient escorted via WC, and D/C home via private auto.  Andrew Stuart 01/25/2023 5:30 PM

## 2023-01-25 NOTE — Evaluation (Signed)
Occupational Therapy Evaluation Patient Details Name: Andrew Stuart MRN: [id number] DOB: [birth year] Today's Date: 01/25/2023   History of Present Illness Patient is s/p Posterior cervical laminoplasty C3-7 with left-sided C3-5 foraminotomies and left-sided C6-7 foraminotomies. No brace needed.   Clinical Impression   Pt seen for OT evaluation this date, POD#1 from surgery above. Prior to hospital admission, pt was independent with mobility, ADL, and IADL. Pt lives with spouse in a single family home and spouse able to provide 24/7 assist/support as needed for pt. Currently pt requires supv/VC for maintaining precautions with all aspects of mobility and self care tasks. Pt/spouse educated in cervical precautions, self care skills, AE/DME, pet care considerations, and home/routines modifications to maximize safety and functional independence while minimizing falls risk and maintaining precautions. Pt/spouse verbalized understanding of all education/training provided. Handout provided to support recall and carry over of learned precautions/techniques for bed mobility, functional transfers, and self care skills. Would benefit from additional OT services while hospitalized.      If plan is discharge home, recommend the following: A little help with walking and/or transfers;A little help with bathing/dressing/bathroom;Assistance with cooking/housework;Assist for transportation;Help with stairs or ramp for entrance    Functional Status Assessment  Patient has had a recent decline in their functional status and demonstrates the ability to make significant improvements in function in a reasonable and predictable amount of time.  Equipment Recommendations  Other (comment);Tub/shower seat Lexicographer)    Recommendations for Other Services       Precautions / Restrictions Precautions Precautions: Cervical;Fall Precaution Booklet Issued: Yes (comment) Restrictions Weight Bearing Restrictions: No       Mobility Bed Mobility               General bed mobility comments: NT, seated EOB wiht PT    Transfers Overall transfer level: Modified independent Equipment used: Rolling walker (2 wheels)                      Balance Overall balance assessment: Modified Independent                                         ADL either performed or assessed with clinical judgement   ADL Overall ADL's : Modified independent                                       General ADL Comments: Pt demonstrates ability to complete ADL with instruction in maintaining precautions and VC.     Vision         Perception         Praxis         Pertinent Vitals/Pain Pain Assessment Pain Assessment: Faces Faces Pain Scale: Hurts a little bit Pain Location: posterior neck Pain Descriptors / Indicators: Tightness Pain Intervention(s): Monitored during session, Ice applied     Extremity/Trunk Assessment Upper Extremity Assessment Upper Extremity Assessment: Overall WFL for tasks assessed   Lower Extremity Assessment Lower Extremity Assessment: Generalized weakness   Cervical / Trunk Assessment Cervical / Trunk Assessment: Neck Surgery   Communication Communication Communication: No apparent difficulties Cueing Techniques: Verbal cues   Cognition Arousal: Alert Behavior During Therapy: WFL for tasks assessed/performed Overall Cognitive Status: Within Functional Limits for tasks assessed  General Comments       Exercises Other Exercises Other Exercises: Pt/spouse educated in home.routines modifications, falls prevention, pet care considerations, AE/DME, and maintaining cervical precautions during ADL and mobility. Handout provided.   Shoulder Instructions      Home Living Family/patient expects to be discharged to:: Private residence Living Arrangements: Spouse/significant other            Home Layout: Two level     Bathroom Shower/Tub: Producer, television/film/video: Standard     Home Equipment: Agricultural consultant (2 wheels)          Prior Functioning/Environment Prior Level of Function : Independent/Modified Independent;Driving             Mobility Comments: not using AD prior to admission ADLs Comments: indep        OT Problem List: Decreased strength;Decreased knowledge of precautions;Decreased knowledge of use of DME or AE      OT Treatment/Interventions: Self-care/ADL training;Therapeutic activities;DME and/or AE instruction;Patient/family education    OT Goals(Current goals can be found in the care plan section) Acute Rehab OT Goals Patient Stated Goal: be independent OT Goal Formulation: With patient/family Time For Goal Achievement: 02/08/23 Potential to Achieve Goals: Good ADL Goals Additional ADL Goal #1: Pt will independently verbalize 100% of cervical precautions and how to mainatin. Additional ADL Goal #2: Pt will complete morning ADL routine while maintaining precautions with PRN VC.  OT Frequency: Min 1X/week    Co-evaluation              AM-PAC OT "6 Clicks" Daily Activity     Outcome Measure Help from another person eating meals?: None Help from another person taking care of personal grooming?: None Help from another person toileting, which includes using toliet, bedpan, or urinal?: None Help from another person bathing (including washing, rinsing, drying)?: A Little Help from another person to put on and taking off regular upper body clothing?: None Help from another person to put on and taking off regular lower body clothing?: A Little 6 Click Score: 22   End of Session Equipment Utilized During Treatment: Rolling walker (2 wheels)  Activity Tolerance: Patient tolerated treatment well Patient left: in chair;with call bell/phone within reach;with family/visitor present  OT Visit Diagnosis: Other abnormalities of  gait and mobility (R26.89)                Time: 4098-1191 OT Time Calculation (min): 20 min Charges:  OT General Charges $OT Visit: 1 Visit OT Evaluation $OT Eval Low Complexity: 1 Low OT Treatments $Self Care/Home Management : 8-22 mins  Arman Filter., MPH, MS, OTR/L ascom [phone] 01/25/23, 11:20 AM

## 2023-01-25 NOTE — TOC Progression Note (Signed)
Transition of Care [facility]) - Progression Note    Patient Details  Name: Andrew Stuart MRN: [id number] Date of Birth: Mar 21, 1951  Transition of Care [facility]) CM/SW Contact  Marlowe Sax, RN Phone Number: 01/25/2023, 11:53 AM  Clinical Narrative:    The patient lives at home with his wife He has a rolling walker at home as well as a Sports administrator and a shower seat Is walking 175 ft with a rolling walker spouse able to provide 24/7 assist/support as needed for pt.    Expected Discharge Plan: Home/Self Care Barriers to Discharge: No Barriers Identified  Expected Discharge Plan and Services   Discharge Planning Services: CM Consult   Living arrangements for the past 2 months: Single Family Home                 DME Arranged: N/A DME Agency: NA                   Social Determinants of Health (SDOH) Interventions SDOH Screenings   Food Insecurity: No Food Insecurity (01/24/2023)  Housing: Low Risk  (01/24/2023)  Transportation Needs: No Transportation Needs (01/24/2023)  Utilities: Not At Risk (01/24/2023)  Alcohol Screen: Low Risk  (01/02/2023)  Depression (PHQ2-9): Low Risk  (01/02/2023)  Financial Resource Strain: Low Risk  (01/02/2023)  Physical Activity: Sufficiently Active (01/02/2023)  Social Connections: Socially Isolated (01/02/2023)  Stress: No Stress Concern Present (01/02/2023)  Tobacco Use: Low Risk  (01/24/2023)  Health Literacy: Adequate Health Literacy (01/02/2023)    Readmission Risk Interventions     No data to display

## 2023-01-25 NOTE — Plan of Care (Signed)
  Problem: Education: Goal: Ability to verbalize activity precautions or restrictions will improve Outcome: Progressing Goal: Knowledge of the prescribed therapeutic regimen will improve Outcome: Progressing   Problem: Activity: Goal: Ability to avoid complications of mobility impairment will improve Outcome: Progressing Goal: Ability to tolerate increased activity will improve Outcome: Progressing Goal: Will remain free from falls Outcome: Progressing   Problem: Clinical Measurements: Goal: Ability to maintain clinical measurements within normal limits will improve Outcome: Progressing Goal: Postoperative complications will be avoided or minimized Outcome: Progressing   Problem: Pain Management: Goal: Pain level will decrease Outcome: Progressing   Problem: Skin Integrity: Goal: Will show signs of wound healing Outcome: Progressing   Problem: Bladder/Genitourinary: Goal: Urinary functional status for postoperative course will improve Outcome: Progressing

## 2023-01-25 NOTE — Evaluation (Signed)
Physical Therapy Evaluation Patient Details Name: Andrew Stuart MRN: [id number] DOB: 1951-01-29 Today's Date: 01/25/2023  History of Present Illness  Patient is s/p Posterior cervical laminoplasty C3-7 with left-sided C3-5 foraminotomies and left-sided C6-7 foraminotomies. No brace needed.  Clinical Impression  Patient received sitting up on side of bed. Spouse present in room. Patient reports he walked some earlier and is agreeable to walking more. Patient ambulates with RW ( does not use at baseline) reports he feels a little unsteady and neck is very stiff. Patient ambulates 175 feet with RW and supervision. No lob. He will continue to benefit from skilled PT to maximize independence and safety with mobility.          If plan is discharge home, recommend the following: A little help with walking and/or transfers;A little help with bathing/dressing/bathroom;Assistance with cooking/housework;Assist for transportation;Help with stairs or ramp for entrance   Can travel by private vehicle   Yes    Equipment Recommendations None recommended by PT  Recommendations for Other Services       Functional Status Assessment Patient has had a recent decline in their functional status and demonstrates the ability to make significant improvements in function in a reasonable and predictable amount of time.     Precautions / Restrictions Precautions Precautions: Cervical;Fall Restrictions Weight Bearing Restrictions: No      Mobility  Bed Mobility                    Transfers Overall transfer level: Modified independent Equipment used: Rolling walker (2 wheels)                    Ambulation/Gait Ambulation/Gait assistance: Modified independent (Device/Increase time), Supervision Gait Distance (Feet): 175 Feet Assistive device: Rolling walker (2 wheels) Gait Pattern/deviations: Step-through pattern       General Gait Details: no lob or difficulty ambulating  noted  Stairs            Wheelchair Mobility     Tilt Bed    Modified Rankin (Stroke Patients Only)       Balance Overall balance assessment: Modified Independent                                           Pertinent Vitals/Pain Pain Assessment Pain Assessment: Faces Faces Pain Scale: Hurts a little bit Pain Location: posterior neck Pain Descriptors / Indicators: Tightness Pain Intervention(s): Ice applied, Monitored during session    Home Living   Living Arrangements: Spouse/significant other               Home Equipment: Agricultural consultant (2 wheels)      Prior Function Prior Level of Function : Independent/Modified Independent;Driving             Mobility Comments: not using AD prior to admission       Extremity/Trunk Assessment   Upper Extremity Assessment Upper Extremity Assessment: Defer to OT evaluation    Lower Extremity Assessment Lower Extremity Assessment: Generalized weakness    Cervical / Trunk Assessment Cervical / Trunk Assessment: Normal  Communication   Communication Communication: No apparent difficulties Cueing Techniques: Verbal cues  Cognition Arousal: Alert Behavior During Therapy: WFL for tasks assessed/performed Overall Cognitive Status: Within Functional Limits for tasks assessed  General Comments      Exercises     Assessment/Plan    PT Assessment Patient needs continued PT services  PT Problem List Decreased activity tolerance;Decreased mobility;Decreased strength;Decreased knowledge of precautions;Decreased skin integrity       PT Treatment Interventions DME instruction;Gait training;Stair training;Functional mobility training;Therapeutic activities;Therapeutic exercise;Balance training;Patient/family education    PT Goals (Current goals can be found in the Care Plan section)  Acute Rehab PT Goals Patient Stated Goal: to return  home PT Goal Formulation: With patient Time For Goal Achievement: 01/29/23 Potential to Achieve Goals: Good    Frequency Min 1X/week     Co-evaluation               AM-PAC PT "6 Clicks" Mobility  Outcome Measure Help needed turning from your back to your side while in a flat bed without using bedrails?: A Little Help needed moving from lying on your back to sitting on the side of a flat bed without using bedrails?: A Little Help needed moving to and from a bed to a chair (including a wheelchair)?: None Help needed standing up from a chair using your arms (e.g., wheelchair or bedside chair)?: None Help needed to walk in hospital room?: A Little Help needed climbing 3-5 steps with a railing? : A Little 6 Click Score: 20    End of Session   Activity Tolerance: Patient tolerated treatment well Patient left: in chair;Other (comment);with family/visitor present (OT present in room) Nurse Communication: Mobility status PT Visit Diagnosis: Other abnormalities of gait and mobility (R26.89);Unsteadiness on feet (R26.81)    Time: 2956-2130 PT Time Calculation (min) (ACUTE ONLY): 9 min   Charges:   PT Evaluation $PT Eval Low Complexity: 1 Low   PT General Charges $$ ACUTE PT VISIT: 1 Visit         Quinita Kostelecky, PT, GCS 01/25/23,10:05 AM

## 2023-01-25 NOTE — Progress Notes (Signed)
   Neurosurgery Progress Note  History: Andrew Stuart is here for cervical myelopathy  POD1: Patient has expected pain. Doing well.  Physical Exam: Vitals:   01/25/23 0423 01/25/23 0752  BP: 112/71 124/65  Pulse: 75 77  Resp: 20 16  Temp: (!) 97.5 F (36.4 C) 97.8 F (36.6 C)  SpO2: 99% 97%    AA Ox3 CNI  Strength:5/5 throughout BUE Incision c/d/I  Drain 140  Data:  Other tests/results: n/a  Assessment/Plan:  Andrew Stuart is doing well after laminoplasty  - mobilize - pain control - DVT prophylaxis - PTOT - watch drain  Venetia Night MD, [facility] [place] Department of Neurosurgery

## 2023-01-25 NOTE — TOC Initial Note (Signed)
Transition of Care [facility]) - Initial/Assessment Note    Patient Details  Name: Andrew Stuart MRN: [id number] Date of Birth: 04-06-1951  [facility]) CM/SW Contact:    Marlowe Sax, RN Phone Number: 01/25/2023, 1:14 PM  Clinical Narrative:                 Clinical Narrative:    The patient lives at home with his wife He has a rolling walker at home as well as a Sports administrator and a shower seat Is walking 175 ft with a rolling walker spouse able to provide 24/7 assist/support as needed for pt.      Expected Discharge Plan: Home/Self Care Barriers to Discharge: No Barriers Identified   Expected Discharge Plan and Services Discharge Planning Services: CM Consult Living arrangements for the past 2 months: Single Family Home                       DME Arranged: N/A DME Agency: NA     Social Determinants of Health (SDOH) Interventions SDOH Screenings    Food Insecurity: No Food Insecurity (01/24/2023)  Housing: Low Risk  (01/24/2023)  Transportation Needs: No Transportation Needs (01/24/2023)  Utilities: Not At Risk (01/24/2023)  Alcohol Screen: Low Risk  (01/02/2023)  Depression (PHQ2-9): Low Risk  (01/02/2023)  Financial Resource Strain: Low Risk  (01/02/2023)  Physical Activity: Sufficiently Active (01/02/2023)  Social Connections: Socially Isolated (01/02/2023)  Stress: No Stress Concern Present (01/02/2023)  Tobacco Use: Low Risk  (01/24/2023)  Health Literacy: Adequate Health Literacy (01/02/2023)       Expected Discharge Plan: Home/Self Care Barriers to Discharge: No Barriers Identified   Patient Goals and CMS Choice            Expected Discharge Plan and Services   Discharge Planning Services: CM Consult   Living arrangements for the past 2 months: Single Family Home                 DME Arranged: N/A DME Agency: NA                  Prior Living Arrangements/Services Living arrangements for the past 2 months: Single Family Home Lives with::  Spouse Patient language and need for interpreter reviewed:: Yes Do you feel safe going back to the place where you live?: Yes      Need for Family Participation in Patient Care: Yes (Comment) Care giver support system in place?: Yes (comment) Current home services: DME (rolling walker) Criminal Activity/Legal Involvement Pertinent to Current Situation/Hospitalization: No - Comment as needed  Activities of Daily Living Home Assistive Devices/Equipment: Eyeglasses ADL Screening (condition at time of admission) Does the patient have a NEW difficulty with bathing/dressing/toileting/self-feeding that is expected to last >3 days?: Yes (Initiates electronic notice to provider for possible OT consult) Does the patient have a NEW difficulty with getting in/out of bed, walking, or climbing stairs that is expected to last >3 days?: Yes (Initiates electronic notice to provider for possible PT consult) Does the patient have a NEW difficulty with communication that is expected to last >3 days?: Yes (Initiates electronic notice to provider for possible SLP consult) Is the patient deaf or have difficulty hearing?: No Does the patient have difficulty seeing, even when wearing glasses/contacts?: Yes (needs glasses) Does the patient have difficulty concentrating, remembering, or making decisions?: No  Permission Sought/Granted   Permission granted to share information with : Yes, Verbal Permission Granted  Emotional Assessment Appearance:: Appears stated age Attitude/Demeanor/Rapport: Engaged Affect (typically observed): Accepting Orientation: : Oriented to Self, Oriented to Place, Oriented to  Time, Oriented to Situation Alcohol / Substance Use: Not Applicable Psych Involvement: No (comment)  Admission diagnosis:  Cervical myelopathy (HCC) [G95.9] Patient Active Problem List   Diagnosis Date Noted   Cervical myelopathy (HCC) 01/24/2023   Cervical spinal stenosis 01/24/2023    Myelomalacia (HCC) 01/24/2023   Precordial pain 01/08/2023   Subacute cough 01/08/2023   Cauda equina compression (HCC) 11/06/2022   Spinal stenosis, lumbar region, 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   Facet arthropathy, lumbar 08/16/2022   Right lumbar radiculitis 07/05/2022   Chronic right-sided low back pain with right-sided sciatica 07/05/2022   Primary hypertension 03/09/2022   Encounter for general adult medical examination with abnormal findings 01/04/2022   Idiopathic progressive neuropathy 10/06/2021   Hyperglycemia 10/06/2021   Varicose veins of both lower extremities with pain 10/06/2021   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   Abnormal electrocardiogram (ECG) (EKG) 03/01/2021   Vitamin B12 deficiency 02/18/2021   Chronic venous insufficiency 02/17/2021   Hyperlipidemia with target LDL less than 130 08/26/2020   BPH without obstruction/lower urinary tract symptoms 08/26/2020   Carpal tunnel syndrome on left 04/28/2020   Nephrolithiasis 03/02/2015   PCP:  [name], MD Pharmacy:   [id number] [place], [place] - [address] [place] [address] Phone: [phone] Fax: [phone]     Social Determinants of Health (SDOH) Social History: SDOH Screenings   Food Insecurity: No Food Insecurity (01/24/2023)  Housing: Low Risk  (01/24/2023)  Transportation Needs: No Transportation Needs (01/24/2023)  Utilities: Not At Risk (01/24/2023)  Alcohol Screen: Low Risk  (01/02/2023)  Depression (PHQ2-9): Low Risk  (01/02/2023)  Financial Resource Strain: Low Risk  (01/02/2023)  Physical Activity: Sufficiently Active (01/02/2023)  Social Connections: Socially Isolated (01/02/2023)  Stress: No Stress Concern Present (01/02/2023)  Tobacco Use: Low Risk  (01/24/2023)  Health Literacy: Adequate Health Literacy (01/02/2023)   SDOH Interventions:     Readmission Risk Interventions     No data to display

## 2023-01-26 NOTE — Anesthesia Postprocedure Evaluation (Signed)
Anesthesia Post Note  Patient: Andrew Stuart  Procedure(s) Performed: C3-7 LAMINOPLASTY (Back)  Patient location during evaluation: PACU Anesthesia Type: General Level of consciousness: awake and alert Pain management: pain level controlled Vital Signs Assessment: post-procedure vital signs reviewed and stable Respiratory status: spontaneous breathing, nonlabored ventilation, respiratory function stable and patient connected to nasal cannula oxygen Cardiovascular status: blood pressure returned to baseline and stable Postop Assessment: no apparent nausea or vomiting Anesthetic complications: no   No notable events documented.   Last Vitals:  Vitals:   01/25/23 1411 01/25/23 1619  BP: 122/69 134/75  Pulse: 66 66  Resp: 18 20  Temp: 37.1 C 36.6 C  SpO2: 100% 98%    Last Pain:  Vitals:   01/25/23 0830  TempSrc:   PainSc: 3                  Lenard Simmer

## 2023-01-27 ENCOUNTER — Encounter (HOSPITAL_COMMUNITY): Payer: Self-pay | Admitting: Emergency Medicine

## 2023-01-27 ENCOUNTER — Observation Stay (HOSPITAL_COMMUNITY)
Admission: EM | Admit: 2023-01-27 | Discharge: 2023-01-29 | Disposition: A | Discharge status: Home / Self Care | Payer: Medicare HMO | Attending: Internal Medicine | Admitting: Internal Medicine

## 2023-01-27 ENCOUNTER — Other Ambulatory Visit: Payer: Self-pay

## 2023-01-27 DIAGNOSIS — E785 Hyperlipidemia, unspecified: Secondary | ICD-10-CM | POA: Diagnosis not present

## 2023-01-27 DIAGNOSIS — I10 Essential (primary) hypertension: Secondary | ICD-10-CM | POA: Diagnosis not present

## 2023-01-27 DIAGNOSIS — M6281 Muscle weakness (generalized): Secondary | ICD-10-CM | POA: Insufficient documentation

## 2023-01-27 DIAGNOSIS — Z7722 Contact with and (suspected) exposure to environmental tobacco smoke (acute) (chronic): Secondary | ICD-10-CM | POA: Diagnosis not present

## 2023-01-27 DIAGNOSIS — Z79899 Other long term (current) drug therapy: Secondary | ICD-10-CM | POA: Insufficient documentation

## 2023-01-27 DIAGNOSIS — I951 Orthostatic hypotension: Secondary | ICD-10-CM | POA: Diagnosis not present

## 2023-01-27 DIAGNOSIS — R55 Syncope and collapse: Secondary | ICD-10-CM | POA: Diagnosis present

## 2023-01-27 DIAGNOSIS — D72829 Elevated white blood cell count, unspecified: Secondary | ICD-10-CM | POA: Insufficient documentation

## 2023-01-27 LAB — CBC WITH DIFFERENTIAL/PLATELET
Abs Immature Granulocytes: 0.04 10*3/uL (ref 0.00–0.07)
Basophils Absolute: 0 10*3/uL (ref 0.0–0.1)
Basophils Relative: 0 %
Eosinophils Absolute: 0 10*3/uL (ref 0.0–0.5)
Eosinophils Relative: 0 %
HCT: 36.4 % — ABNORMAL LOW (ref 39.0–52.0)
Hemoglobin: 12.1 g/dL — ABNORMAL LOW (ref 13.0–17.0)
Immature Granulocytes: 0 %
Lymphocytes Relative: 12 %
Lymphs Abs: 1.3 10*3/uL (ref 0.7–4.0)
MCH: 30.3 pg (ref 26.0–34.0)
MCHC: 33.2 g/dL (ref 30.0–36.0)
MCV: 91 fL (ref 80.0–100.0)
Monocytes Absolute: 0.9 10*3/uL (ref 0.1–1.0)
Monocytes Relative: 8 %
Neutro Abs: 8.4 10*3/uL — ABNORMAL HIGH (ref 1.7–7.7)
Neutrophils Relative %: 80 %
Platelets: 220 10*3/uL (ref 150–400)
RBC: 4 MIL/uL — ABNORMAL LOW (ref 4.22–5.81)
RDW: 12.4 % (ref 11.5–15.5)
WBC: 10.6 10*3/uL — ABNORMAL HIGH (ref 4.0–10.5)
nRBC: 0 % (ref 0.0–0.2)

## 2023-01-27 LAB — BASIC METABOLIC PANEL
Anion gap: 9 (ref 5–15)
BUN: 12 mg/dL (ref 8–23)
CO2: 24 mmol/L (ref 22–32)
Calcium: 8.9 mg/dL (ref 8.9–10.3)
Chloride: 107 mmol/L (ref 98–111)
Creatinine, Ser: 0.54 mg/dL — ABNORMAL LOW (ref 0.61–1.24)
GFR, Estimated: >60 mL/min (ref >60)
Glucose, Bld: 107 mg/dL — ABNORMAL HIGH (ref 70–99)
Potassium: 3.8 mmol/L (ref 3.5–5.1)
Sodium: 140 mmol/L (ref 135–145)

## 2023-01-27 LAB — D-DIMER, QUANTITATIVE: D-Dimer, Quant: 0.59 ug{FEU}/mL — ABNORMAL HIGH (ref 0.00–0.50)

## 2023-01-27 MED ORDER — ALBUTEROL SULFATE (2.5 MG/3ML) 0.083% IN NEBU: 2.5000 mg | INHALATION_SOLUTION | RESPIRATORY_TRACT | Status: DC | PRN

## 2023-01-27 MED ORDER — ACETAMINOPHEN 500 MG PO TABS
1000.0000 mg | ORAL_TABLET | Freq: Once | ORAL | Status: AC
  Administered 2023-01-27: 1000 mg via ORAL
  Filled 2023-01-27: qty 2

## 2023-01-27 MED ORDER — ATORVASTATIN CALCIUM 10 MG PO TABS
20.0000 mg | ORAL_TABLET | Freq: Every day | ORAL | Status: DC
  Administered 2023-01-27 – 2023-01-28 (×2): 20 mg via ORAL
  Filled 2023-01-27 (×2): qty 2

## 2023-01-27 MED ORDER — GABAPENTIN 100 MG PO CAPS: 100.0000 mg | ORAL_CAPSULE | Freq: Two times a day (BID) | ORAL | Status: DC

## 2023-01-27 MED ORDER — ONDANSETRON HCL 4 MG/2ML IJ SOLN: 4.0000 mg | Freq: Four times a day (QID) | INTRAMUSCULAR | Status: DC | PRN

## 2023-01-27 MED ORDER — ONDANSETRON HCL 4 MG/2ML IJ SOLN
4.0000 mg | Freq: Once | INTRAMUSCULAR | Status: AC
  Administered 2023-01-27: 4 mg via INTRAVENOUS
  Filled 2023-01-27: qty 2

## 2023-01-27 MED ORDER — ONDANSETRON HCL 4 MG PO TABS: 4.0000 mg | ORAL_TABLET | Freq: Four times a day (QID) | ORAL | Status: DC | PRN

## 2023-01-27 MED ORDER — METHOCARBAMOL 500 MG PO TABS
500.0000 mg | ORAL_TABLET | Freq: Three times a day (TID) | ORAL | Status: DC | PRN
  Administered 2023-01-27: 500 mg via ORAL
  Filled 2023-01-27: qty 1

## 2023-01-27 MED ORDER — ENOXAPARIN SODIUM 40 MG/0.4ML IJ SOSY
40.0000 mg | PREFILLED_SYRINGE | INTRAMUSCULAR | Status: DC
  Administered 2023-01-27 – 2023-01-28 (×2): 40 mg via SUBCUTANEOUS
  Filled 2023-01-27 (×2): qty 0.4

## 2023-01-27 MED ORDER — MORPHINE SULFATE (PF) 4 MG/ML IV SOLN
4.0000 mg | Freq: Once | INTRAVENOUS | Status: AC
  Administered 2023-01-27: 4 mg via INTRAVENOUS
  Filled 2023-01-27: qty 1

## 2023-01-27 MED ORDER — OXYCODONE HCL 5 MG PO TABS
5.0000 mg | ORAL_TABLET | ORAL | Status: DC | PRN
  Administered 2023-01-27: 5 mg via ORAL
  Filled 2023-01-27: qty 1

## 2023-01-27 MED ORDER — SENNA 8.6 MG PO TABS
1.0000 | ORAL_TABLET | Freq: Two times a day (BID) | ORAL | Status: DC
  Administered 2023-01-27 – 2023-01-29 (×4): 8.6 mg via ORAL
  Filled 2023-01-27 (×4): qty 1

## 2023-01-27 MED ORDER — TRAZODONE HCL 50 MG PO TABS: 25.0000 mg | ORAL_TABLET | Freq: Every evening | ORAL | Status: DC | PRN

## 2023-01-27 MED ORDER — ACETAMINOPHEN 500 MG PO TABS
500.0000 mg | ORAL_TABLET | Freq: Four times a day (QID) | ORAL | Status: DC | PRN
  Administered 2023-01-27: 500 mg via ORAL
  Filled 2023-01-27: qty 1

## 2023-01-27 MED ORDER — PSYLLIUM 95 % PO PACK
1.0000 | PACK | Freq: Every day | ORAL | Status: DC
  Administered 2023-01-28: 1 via ORAL
  Filled 2023-01-27 (×2): qty 1

## 2023-01-27 MED ORDER — SODIUM CHLORIDE 0.9 % IV BOLUS
1000.0000 mL | Freq: Once | INTRAVENOUS | Status: AC
  Administered 2023-01-27: 1000 mL via INTRAVENOUS

## 2023-01-27 MED ORDER — SODIUM CHLORIDE 0.9 % IV SOLN: INTRAVENOUS | Status: DC

## 2023-01-27 MED ORDER — GABAPENTIN 100 MG PO CAPS: 100.0000 mg | ORAL_CAPSULE | Freq: Two times a day (BID) | ORAL | Status: DC | PRN

## 2023-01-27 MED ORDER — BOOST / RESOURCE BREEZE PO LIQD CUSTOM
1.0000 | Freq: Two times a day (BID) | ORAL | Status: DC
  Administered 2023-01-28 (×2): 1 via ORAL

## 2023-01-27 NOTE — H&P (Signed)
History and Physical  Andrew Stuart:[id number] DOB: 08/05/1950 DOA: 01/27/2023  PCP: [name], MD   Chief Complaint: Syncope  HPI: Andrew Stuart is a 72 y.o. male with medical history significant for cervical myelopathy who underwent C3-C7 laminoplasty on 9/25 with Dr. Marcell Barlow at [facility] and was discharged the following day, now presenting to the ER for evaluation of syncope.  History is provided by the patient and his husband was at the bedside, they state that he had a syncopal episode where he went to stand up on the day of hospital discharge.  After that he went home without incident, the following day she had a syncopal episode after being up and walking around for a few minutes, felt dizzy and lightheaded, went to the bathroom to urinate while standing up, and syncopized.  Had another episode this morning, which also happened when he first stood up.  Spoke to his provider, who recommended that he come to the ER for evaluation.  Patient denies any bleeding, his surgical drain was removed on the day of discharge from the hospital.  Denies any fevers, chills, chest pain, nausea, vomiting.  Has had very low appetite, having some pain with swallowing and so has not eaten or drank very much.  Feels like he is urinating a normal amount.  ED Course: Evaluation here in the emergency department, vital signs are relatively unremarkable, initial blood pressure 145/80 now improved to 124/73.  Lab work including CBC and BMP significant only for WBC 11, hemoglobin 12 from baseline of about 15.  EKG unremarkable.  Per ER provider, patient feels somewhat uncomfortable about going home, hospitalist was contacted to consider observation.  Review of Systems: Please see HPI for pertinent positives and negatives. A complete 10 system review of systems are otherwise negative.  Past Medical History:  Diagnosis Date   Abnormal EKG    Allergy    Arthritis    BPH (benign prostatic hyperplasia)     Carpal tunnel syndrome, left    Cauda equina compression (HCC)    Cervical stenosis of spine    severe C3-4   Chronic venous insufficiency    Elevated blood pressure reading    Hepatitis    History of kidney stones    Hyperglycemia    Hyperlipidemia    Idiopathic progressive neuropathy    Right lumbar radiculitis    Spinal stenosis, lumbar region with neurogenic claudication    Varicose veins of both lower extremities    Vitamin B 12 deficiency    Past Surgical History:  Procedure Laterality Date   CARPAL TUNNEL RELEASE Right 2015   CARPAL TUNNEL RELEASE Left 2021   COLONOSCOPY     ENDOVENOUS ABLATION SAPHENOUS VEIN W/ LASER Left 04/12/2022   endovenous laser ablation left greater saphenous vein and stab phlebectomy < 10 incisions left leg by Cari Caraway MD   HEMORRHOID SURGERY     HERNIA REPAIR     left foot surgery     TONSILLECTOMY      Social History:  reports that he has never smoked. He has never been exposed to tobacco smoke. He has never used smokeless tobacco. He reports current alcohol use of about 7.0 standard drinks of alcohol per week. He reports that he does not use drugs.   Allergies  Allergen Reactions   Bactrim [Sulfamethoxazole-Trimethoprim] Rash   Azithromycin Swelling   Bee Pollen    Blueberry Flavor Rash    Can have, but not an abundance.  Strawberry Extract Rash    Can have, but not an abundance.     Family History  Problem Relation Age of Onset   Arthritis Mother    Diabetes Mother    Early death Mother    Heart disease Mother    Hyperlipidemia Mother    Colon cancer Mother    Early death Father    Early death Sister    Colon cancer Sister    ALS Sister    Early death Brother    Lung cancer Brother    Liver cancer Brother      Prior to Admission medications   Medication Sig Start Date End Date Taking? Authorizing Provider  acetaminophen (TYLENOL) 500 MG tablet Take 500 mg by mouth every 6 (six) hours as needed for moderate  pain.   Yes [provider]  atorvastatin (LIPITOR) 20 MG tablet TAKE 1 TABLET BY MOUTH EVERY DAY 01/19/23  Yes [name], MD  celecoxib (CELEBREX) 200 MG capsule Take 1 capsule (200 mg total) by mouth 2 (two) times daily. 01/25/23 02/24/23 Yes Venetia Night, MD  gabapentin (NEURONTIN) 100 MG capsule Take 1 capsule (100 mg total) by mouth 3 (three) times daily. Patient taking differently: Take 100 mg by mouth See admin instructions. Take 100mg  (1 capsule) by mouth one to two times daily. 10/23/22  Yes Elijah Birk C, DO  MAGNESIUM PO Take 250 mg by mouth daily.   Yes [provider]  methocarbamol (ROBAXIN) 500 MG tablet Take 1 tablet (500 mg total) by mouth every 8 (eight) hours as needed for muscle spasms. 01/25/23  Yes Venetia Night, MD  Naproxen Sodium (ALEVE PO) Take 220 tablets by mouth every 6 (six) hours as needed (Pain).   Yes [provider]  oxyCODONE (OXY IR/ROXICODONE) 5 MG immediate release tablet Take 1 tablet (5 mg total) by mouth every 4 (four) hours as needed for moderate pain ((score 4 to 6)). 01/25/23  Yes Venetia Night, MD  psyllium (METAMUCIL) 58.6 % packet Take 1 packet by mouth daily.   Yes [provider]  senna (SENOKOT) 8.6 MG TABS tablet Take 1 tablet (8.6 mg total) by mouth 2 (two) times daily. 01/25/23  Yes Venetia Night, MD  VITAMIN B1-B12 IJ Inject 1 Dose as directed every 30 (thirty) days.   Yes [provider]    Physical Exam: BP 124/73 (BP Location: Right Arm)   Pulse 80   Temp 98.3 F (36.8 C) (Oral)   Resp 16   Ht 5\' 9"  (1.753 m)   Wt 75.8 kg   SpO2 96%   BMI 24.66 kg/m   General:  Alert, oriented, calm, in no acute distress  Eyes: EOMI, clear conjuctivae, white sclerea Neck: supple, no masses, trachea mildline, posterior midline incision clean dry and intact, with overlying bandage intact Cardiovascular: RRR, no murmurs or rubs, no peripheral edema  Respiratory: clear to  auscultation bilaterally, no wheezes, no crackles  Abdomen: soft, nontender, nondistended, normal bowel tones heard  Skin: dry, no rashes  Musculoskeletal: no joint effusions, normal range of motion  Psychiatric: appropriate affect, normal speech  Neurologic: extraocular muscles intact, clear speech, moving all extremities with intact sensorium         Labs on Admission:  Basic Metabolic Panel: Recent Labs  Lab 01/27/23 1028  NA 140  K 3.8  CL 107  CO2 24  GLUCOSE 107*  BUN 12  CREATININE 0.54*  CALCIUM 8.9   Liver Function Tests: No results for input(s): "AST", "  ALT", "ALKPHOS", "BILITOT", "PROT", "ALBUMIN" in the last 168 hours. No results for input(s): "LIPASE", "AMYLASE" in the last 168 hours. No results for input(s): "AMMONIA" in the last 168 hours. CBC: Recent Labs  Lab 01/27/23 1028  WBC 10.6*  NEUTROABS 8.4*  HGB 12.1*  HCT 36.4*  MCV 91.0  PLT 220   Cardiac Enzymes: No results for input(s): "CKTOTAL", "CKMB", "CKMBINDEX", "TROPONINI" in the last 168 hours.  BNP (last 3 results) No results for input(s): "BNP" in the last 8760 hours.  ProBNP (last 3 results) No results for input(s): "PROBNP" in the last 8760 hours.  CBG: No results for input(s): "GLUCAP" in the last 168 hours.  Radiological Exams on Admission: No results found.  Assessment/Plan This is a 72 year old gentleman with a history of cervical myelopathy who underwent recent laminectomy and is now being admitted to the hospital for observation after multiple syncopal episodes likely vasovagal and orthostatic in nature.  No concerning findings such as acute infection, hypotension, significant anemia, etc.  Syncope-most likely orthostatic in the setting of mild dehydration -Observation admission -Continuous telemetry -Gentle IV fluids and encourage p.o. intake -Check orthostatic vital signs every shift -PT/OT  Status postlaminectomy-continue oxycodone as needed for pain, follow-up as an  outpatient with Dr. Marcell Barlow as previously scheduled  DVT prophylaxis: Lovenox     Code Status: Full Code  Consults called: None  Admission status: Observation  Time spent: 49 minutes  Ziyonna Christner Sharlette Dense MD [facility] Pager [phone]  If 7PM-7AM, please contact night-coverage [email or web address] Password [email or web address]  01/27/2023, 1:33 PM

## 2023-01-27 NOTE — ED Notes (Signed)
ED TO INPATIENT HANDOFF REPORT  Name/Age/Gender Andrew Stuart 72 y.o. male  Code Status    Code Status Orders  (From admission, onward)           Start     Ordered   01/27/23 1317  Full code  Continuous       Question:  By:  Answer:  Consent: discussion documented in EHR   01/27/23 1317           Code Status History     Date Active Date Inactive Code Status Order ID Comments User Context   01/24/2023 1452 01/25/2023 2240 Full Code [id number]  Susanne Borders, PA Inpatient      Advance Directive Documentation    Flowsheet Row Most Recent Value  Type of Advance Directive Living will  Pre-existing out of facility DNR order (yellow form or pink MOST form) --  "MOST" Form in Place? --       Home/SNF/Other Home  Chief Complaint Syncope [R55]  Level of Care/Admitting Diagnosis ED Disposition     ED Disposition  Admit   Condition  --   Comment  Hospital Area: [facility] [[id number]]  Level of Care: Telemetry [5]  Admit to tele based on following criteria: Eval of Syncope  May place patient in observation at [facility] or [facility] if equivalent level of care is available:: Yes  Covid Evaluation: Asymptomatic - no recent exposure (last 10 days) testing not required  Diagnosis: Syncope [206001]  Admitting Physician: Maryln Gottron [[id number]]  Attending Physician: Kirby Crigler, MIR [email or web address] [[id number]]          Medical History Past Medical History:  Diagnosis Date   Abnormal EKG    Allergy    Arthritis    BPH (benign prostatic hyperplasia)    Carpal tunnel syndrome, left    Cauda equina compression (HCC)    Cervical stenosis of spine    severe C3-4   Chronic venous insufficiency    Elevated blood pressure reading    Hepatitis    History of kidney stones    Hyperglycemia    Hyperlipidemia    Idiopathic progressive neuropathy    Right lumbar radiculitis    Spinal stenosis, lumbar region with neurogenic claudication    Varicose  veins of both lower extremities    Vitamin B 12 deficiency     Allergies Allergies  Allergen Reactions   Bactrim [Sulfamethoxazole-Trimethoprim] Rash   Azithromycin Swelling   Bee Pollen    Blueberry Flavor Rash    Can have, but not an abundance.   Strawberry Extract Rash    Can have, but not an abundance.     IV Location/Drains/Wounds Patient Lines/Drains/Airways Status     Active Line/Drains/Airways     Name Placement date Placement time Site Days   Peripheral IV 01/24/23 20 G 1" Left;Posterior Hand 01/24/23  0921  Hand  3   Peripheral IV 01/27/23 20 G Left;Posterior Forearm 01/27/23  1025  Forearm  less than 1   Closed System Drain 1 Posterior Neck Accordion (Hemovac) 10 Fr. 01/24/23  1310  Neck  3            Labs/Imaging Results for orders placed or performed during the hospital encounter of 01/27/23 (from the past 48 hour(s))  CBC with Differential     Status: Abnormal   Collection Time: 01/27/23 10:28 AM  Result Value Ref Range   WBC 10.6 (H) 4.0 - 10.5 K/uL   RBC 4.00 (L) 4.22 -  5.81 MIL/uL   Hemoglobin 12.1 (L) 13.0 - 17.0 g/dL   HCT 29.5 (L) 62.1 - 30.8 %   MCV 91.0 80.0 - 100.0 fL   MCH 30.3 26.0 - 34.0 pg   MCHC 33.2 30.0 - 36.0 g/dL   RDW 65.7 84.6 - 96.2 %   Platelets 220 150 - 400 K/uL   nRBC 0.0 0.0 - 0.2 %   Neutrophils Relative % 80 %   Neutro Abs 8.4 (H) 1.7 - 7.7 K/uL   Lymphocytes Relative 12 %   Lymphs Abs 1.3 0.7 - 4.0 K/uL   Monocytes Relative 8 %   Monocytes Absolute 0.9 0.1 - 1.0 K/uL   Eosinophils Relative 0 %   Eosinophils Absolute 0.0 0.0 - 0.5 K/uL   Basophils Relative 0 %   Basophils Absolute 0.0 0.0 - 0.1 K/uL   Immature Granulocytes 0 %   Abs Immature Granulocytes 0.04 0.00 - 0.07 K/uL    Comment: Performed at [facility], [address] [place], [place] [address]  D-dimer, quantitative     Status: Abnormal   Collection Time: 01/27/23 10:28 AM  Result Value Ref Range   D-Dimer, Quant 0.59 (H) 0.00 -  0.50 ug/mL-FEU    Comment: (NOTE) At the manufacturer cut-off value of 0.5 g/mL FEU, this assay has a negative predictive value of 95-100%.This assay is intended for use in conjunction with a clinical pretest probability (PTP) assessment model to exclude pulmonary embolism (PE) and deep venous thrombosis (DVT) in outpatients suspected of PE or DVT. Results should be correlated with clinical presentation. Performed at [facility], [address] [place], [place] [address]   Basic metabolic panel     Status: Abnormal   Collection Time: 01/27/23 10:28 AM  Result Value Ref Range   Sodium 140 135 - 145 mmol/L   Potassium 3.8 3.5 - 5.1 mmol/L   Chloride 107 98 - 111 mmol/L   CO2 24 22 - 32 mmol/L   Glucose, Bld 107 (H) 70 - 99 mg/dL    Comment: Glucose reference range applies only to samples taken after fasting for at least 8 hours.   BUN 12 8 - 23 mg/dL   Creatinine, Ser 0.10 (L) 0.61 - 1.24 mg/dL   Calcium 8.9 8.9 - 27.2 mg/dL   GFR, Estimated >53 >66 mL/min    Comment: (NOTE) Calculated using the CKD-EPI Creatinine Equation (2021)    Anion gap 9 5 - 15    Comment: Performed at [facility], [address], [place], [place] [address]   No results found.  Pending Labs Unresulted Labs (From admission, onward)     Start     Ordered   01/28/23 0500  Basic metabolic panel  Tomorrow morning,   R        01/27/23 1317   01/28/23 0500  CBC  Tomorrow morning,   R        01/27/23 1317            Vitals/Pain Today's Vitals   01/27/23 0955 01/27/23 0958 01/27/23 1130 01/27/23 1300  BP:  (!) 143/76 (!) 145/80 124/73  Pulse:   80   Resp:   18 16  Temp:      TempSrc:      SpO2:   100% 96%  Weight: 75.8 kg     Height: 5\' 9"  (1.753 m)     PainSc: 0-No pain   3     Isolation Precautions No active  isolations  Medications Medications  acetaminophen (TYLENOL) tablet 500 mg (has no administration in time range)  oxyCODONE (Oxy  IR/ROXICODONE) immediate release tablet 5 mg (has no administration in time range)  atorvastatin (LIPITOR) tablet 20 mg (has no administration in time range)  psyllium (HYDROCIL/METAMUCIL) 1 packet (has no administration in time range)  senna (SENOKOT) tablet 8.6 mg (has no administration in time range)  methocarbamol (ROBAXIN) tablet 500 mg (has no administration in time range)  enoxaparin (LOVENOX) injection 40 mg (has no administration in time range)  traZODone (DESYREL) tablet 25 mg (has no administration in time range)  ondansetron (ZOFRAN) tablet 4 mg (has no administration in time range)    Or  ondansetron (ZOFRAN) injection 4 mg (has no administration in time range)  albuterol (PROVENTIL) (2.5 MG/3ML) 0.083% nebulizer solution 2.5 mg (has no administration in time range)  0.9 %  sodium chloride infusion (has no administration in time range)  feeding supplement (BOOST / RESOURCE BREEZE) liquid 1 Container (has no administration in time range)  gabapentin (NEURONTIN) capsule 100 mg (has no administration in time range)  sodium chloride 0.9 % bolus 1,000 mL (0 mLs Intravenous Stopped 01/27/23 1125)  morphine (PF) 4 MG/ML injection 4 mg (4 mg Intravenous Given 01/27/23 1140)  ondansetron (ZOFRAN) injection 4 mg (4 mg Intravenous Given 01/27/23 1139)  acetaminophen (TYLENOL) tablet 1,000 mg (1,000 mg Oral Given 01/27/23 1133)    Mobility walks

## 2023-01-27 NOTE — ED Provider Notes (Signed)
[name]   CSN: [id number] Arrival date & time: 01/27/23  0941     History  Chief Complaint  Patient presents with   Syncope    Andrew Stuart is a 72 y.o. male.  This is a 72 year old male who presents emergency department after multiple syncopal episodes that have occurred since returning home following cervical laminoplasty on 9/25.  Since being discharged, patient has had 3 syncopal episodes at home.  There have been no falls associated with these, they have occurred while the patient has been sitting down.  He has not had any chest pain associated with these events.        Home Medications Prior to Admission medications   Medication Sig Start Date End Date Taking? Authorizing Provider  acetaminophen (TYLENOL) 500 MG tablet Take 500 mg by mouth every 6 (six) hours as needed for moderate pain.    [provider]  atorvastatin (LIPITOR) 20 MG tablet TAKE 1 TABLET BY MOUTH EVERY DAY 01/19/23   [name], MD  celecoxib (CELEBREX) 200 MG capsule Take 1 capsule (200 mg total) by mouth 2 (two) times daily. 01/25/23 02/24/23  Venetia Night, MD  fluticasone (FLONASE) 50 MCG/ACT nasal spray Place 1 spray into both nostrils as needed for allergies.    [provider]  gabapentin (NEURONTIN) 100 MG capsule Take 1 capsule (100 mg total) by mouth 3 (three) times daily. Patient taking differently: Take 100 mg by mouth every evening. 10/23/22   Angelina Sheriff, DO  MAGNESIUM PO Take 250 mg by mouth daily.    [provider]  methocarbamol (ROBAXIN) 500 MG tablet Take 1 tablet (500 mg total) by mouth every 8 (eight) hours as needed for muscle spasms. 01/25/23   Venetia Night, MD  Multiple Vitamin (MULTIVITAMIN WITH MINERALS) TABS tablet Take 1 tablet by mouth daily.    [provider]  Naproxen Sodium (ALEVE PO) Take 1 tablet by mouth as needed. Patient not taking: Reported on  01/24/2023    [provider]  oxyCODONE (OXY IR/ROXICODONE) 5 MG immediate release tablet Take 1 tablet (5 mg total) by mouth every 4 (four) hours as needed for moderate pain ((score 4 to 6)). 01/25/23   Venetia Night, MD  psyllium (METAMUCIL) 58.6 % packet Take 1 packet by mouth daily.    [provider]  senna (SENOKOT) 8.6 MG TABS tablet Take 1 tablet (8.6 mg total) by mouth 2 (two) times daily. 01/25/23   Venetia Night, MD  VITAMIN B1-B12 IJ Inject 1 Dose as directed every 30 (thirty) days.    [provider]      Allergies    Bactrim [sulfamethoxazole-trimethoprim], Azithromycin, Bee pollen, Blueberry flavor, and Strawberry extract    Review of Systems   Review of Systems  Physical Exam Updated Vital Signs BP (!) 145/80 (BP Location: Right Arm)   Pulse 80   Temp 98.3 F (36.8 C) (Oral)   Resp 18   Ht 5\' 9"  (1.753 m)   Wt 75.8 kg   SpO2 100%   BMI 24.66 kg/m  Physical Exam Vitals reviewed.  HENT:     Head: Normocephalic and atraumatic.  Cardiovascular:     Rate and Rhythm: Normal rate and regular rhythm.     Heart sounds: No murmur heard.    Comments: No carotid bruit Pulmonary:     Effort: Pulmonary effort is normal. No respiratory distress.     Breath sounds: Normal breath  sounds.  Musculoskeletal:     Cervical back: Normal range of motion.  Skin:    General: Skin is warm and dry.     Comments: Well-healing surgical incision at the midline on the posterior neck  Neurological:     General: No focal deficit present.     Mental Status: He is alert and oriented to person, place, and time.     Sensory: No sensory deficit.     Motor: No weakness.     ED Results / Procedures / Treatments   Labs (all labs ordered are listed, but only abnormal results are displayed) Labs Reviewed  CBC WITH DIFFERENTIAL/PLATELET - Abnormal; Notable for the following components:      Result Value   WBC 10.6 (*)    RBC 4.00 (*)    Hemoglobin 12.1  (*)    HCT 36.4 (*)    Neutro Abs 8.4 (*)    All other components within normal limits  D-DIMER, QUANTITATIVE - Abnormal; Notable for the following components:   D-Dimer, Quant 0.59 (*)    All other components within normal limits  BASIC METABOLIC PANEL - Abnormal; Notable for the following components:   Glucose, Bld 107 (*)    Creatinine, Ser 0.54 (*)    All other components within normal limits    EKG EKG Interpretation Date/Time:  Saturday January 27 2023 09:51:12 EDT Ventricular Rate:  94 PR Interval:  181 QRS Duration:  93 QT Interval:  348 QTC Calculation: 436 R Axis:   24  Text Interpretation: Sinus rhythm Atrial premature complex Confirmed by Anders Simmonds [phone]) on 01/27/2023 9:57:55 AM  Radiology No results found.  Procedures Procedures    Medications Ordered in ED Medications  sodium chloride 0.9 % bolus 1,000 mL (0 mLs Intravenous Stopped 01/27/23 1125)  morphine (PF) 4 MG/ML injection 4 mg (4 mg Intravenous Given 01/27/23 1140)  ondansetron (ZOFRAN) injection 4 mg (4 mg Intravenous Given 01/27/23 1139)  acetaminophen (TYLENOL) tablet 1,000 mg (1,000 mg Oral Given 01/27/23 1133)    ED Course/ Medical Decision Making/ A&P                                 Medical Decision Making 72 year old male who is here today with a few syncopal episodes at home.  Differential diagnoses include anemia, dehydration, PE, cardiogenic syncope, polypharmacy.  Plan-since having surgery, patient has been taking hydrocodone, gabapentin and Robaxin.  I do think that this could be playing a role in the syncopal episodes.  My independent review of the patient's EKG shows normal intervals, no ST segment depression or elevation, no T wave inversions, no evidence of acute ischemia.  Will check CBC and BMP.  D-dimer ordered.  Reassessment-age-adjusted D-dimer negative. Obtain angiography of the chest.  Discussed admission versus discharge with the patient, and they preferred  admission.  Believe this is reasonable for a syncope observation.  Will admit to hospitalist.  Will provide some IV fluids.  Amount and/or Complexity of Data Reviewed Labs: ordered.  Risk OTC drugs. Prescription drug management. Decision regarding hospitalization.           Final Clinical Impression(s) / ED Diagnoses Final diagnoses:  Syncope, unspecified syncope type    Rx / DC Orders ED Discharge Orders     None         Anders Simmonds T, DO 01/27/23 1224

## 2023-01-27 NOTE — Plan of Care (Signed)
  Problem: Education: Goal: Ability to verbalize activity precautions or restrictions will improve Outcome: Progressing Goal: Knowledge of the prescribed therapeutic regimen will improve Outcome: Progressing Goal: Understanding of discharge needs will improve Outcome: Progressing   Problem: Activity: Goal: Ability to avoid complications of mobility impairment will improve Outcome: Progressing Goal: Ability to tolerate increased activity will improve Outcome: Progressing Goal: Will remain free from falls Outcome: Progressing   

## 2023-01-27 NOTE — ED Triage Notes (Signed)
Pt BIB by EMS coming from home. Had cervical procedure done 01/24/23, was d/c home Thursday. Had syncope episode Friday at 7am. Had another episode this morning and called Dr. recommended coming to the ED. Not on thinner. Denies hitting head, and any pain at this time just cramping on neck.

## 2023-01-28 ENCOUNTER — Observation Stay (HOSPITAL_BASED_OUTPATIENT_CLINIC_OR_DEPARTMENT_OTHER): Payer: Medicare HMO

## 2023-01-28 DIAGNOSIS — E785 Hyperlipidemia, unspecified: Secondary | ICD-10-CM | POA: Diagnosis not present

## 2023-01-28 DIAGNOSIS — R55 Syncope and collapse: Secondary | ICD-10-CM | POA: Diagnosis not present

## 2023-01-28 LAB — ECHOCARDIOGRAM COMPLETE
AR max vel: 2.08 cm2
AV Area VTI: 2.2 cm2
AV Area mean vel: 2.18 cm2
AV Mean grad: 6 mm[Hg]
AV Peak grad: 10.5 mm[Hg]
Ao pk vel: 1.62 m/s
Area-P 1/2: 2.99 cm2
Height: 69 in
S' Lateral: 2.6 cm
Weight: 2672 [oz_av]

## 2023-01-28 LAB — BASIC METABOLIC PANEL
Anion gap: 7 (ref 5–15)
BUN: 13 mg/dL (ref 8–23)
CO2: 25 mmol/L (ref 22–32)
Calcium: 8.3 mg/dL — ABNORMAL LOW (ref 8.9–10.3)
Chloride: 105 mmol/L (ref 98–111)
Creatinine, Ser: 0.55 mg/dL — ABNORMAL LOW (ref 0.61–1.24)
GFR, Estimated: >60 mL/min (ref >60)
Glucose, Bld: 92 mg/dL (ref 70–99)
Potassium: 3.8 mmol/L (ref 3.5–5.1)
Sodium: 137 mmol/L (ref 135–145)

## 2023-01-28 LAB — CBC
HCT: 32 % — ABNORMAL LOW (ref 39.0–52.0)
Hemoglobin: 10.4 g/dL — ABNORMAL LOW (ref 13.0–17.0)
MCH: 29.8 pg (ref 26.0–34.0)
MCHC: 32.5 g/dL (ref 30.0–36.0)
MCV: 91.7 fL (ref 80.0–100.0)
Platelets: 214 10*3/uL (ref 150–400)
RBC: 3.49 MIL/uL — ABNORMAL LOW (ref 4.22–5.81)
RDW: 12.5 % (ref 11.5–15.5)
WBC: 7.3 10*3/uL (ref 4.0–10.5)
nRBC: 0 % (ref 0.0–0.2)

## 2023-01-28 NOTE — Progress Notes (Addendum)
[facility] PROGRESS NOTE    Progress Note  Andrew Stuart  NFA:[id number] DOB: 1950/06/04 DOA: 01/27/2023 PCP: [name], MD     Brief Narrative:   Andrew Stuart is an 72 y.o. male past medical history significant for cervical myelopathy who underwent C3 C7 laminoplasty on 01/24/2023 with Dr. Marcell Barlow at [facility] was discharged the following day presents to the ER with syncope he relates dizziness upon standing with lightheadedness sat in the bathroom to urinate stand up and syncopized.  After that he had another 2 episodes also happen when he stood up.   Assessment/Plan:   Syncope probably due to orthostatic hypotension: Orthostatics were negative on admission heart rate went from 76-98 upon standing, he describes in detail and very clearly several episodes of passing out upon standing. Continue IV fluids. PT OT has been consulted. He has had no events on telemetry. Check a 2D echo. Hold all antihypertensive medication.  Leukocytosis: Denies any cough, shortness of breath, abdominal pain or fever chills. Question reactive, continue to hold antibiotics.  Status postlaminectomy: Continue narcotics for pain.  Hyperlipidemia with target LDL less than 130 Continue statins  Primary hypertension Hold all antihypertensive medication.   DVT prophylaxis: lovenoxn Family Communication:Wife Status is: Observation The patient remains OBS appropriate and will d/c before 2 midnights.    Code Status:     Code Status Orders  (From admission, onward)           Start     Ordered   01/27/23 1317  Full code  Continuous       Question:  By:  Answer:  Consent: discussion documented in EHR   01/27/23 1317           Code Status History     Date Active Date Inactive Code Status Order ID Comments User Context   01/24/2023 1452 01/25/2023 2240 Full Code [id number]  Susanne Borders, PA Inpatient      Advance Directive Documentation    Flowsheet Row Most  Recent Value  Type of Advance Directive Living will  Pre-existing out of facility DNR order (yellow form or pink MOST form) --  "MOST" Form in Place? --         IV Access:   Peripheral IV   Procedures and diagnostic studies:   No results found.   Medical Consultants:   None.   Subjective:    Andrew Stuart he relates he continues to have spasms in his neck lightheadedness upon standing is improved but still has it.  Objective:    Vitals:   01/27/23 1437 01/27/23 1826 01/27/23 2256 01/28/23 0238  BP: 121/73 125/76 (!) 150/77 129/70  Pulse: 77 85 86 84  Resp: 16  18 18   Temp: 98 F (36.7 C)  98.2 F (36.8 C) 97.6 F (36.4 C)  TempSrc:   Oral Oral  SpO2: 99% 97% 100% 98%  Weight:      Height:       SpO2: 98 %   Intake/Output Summary (Last 24 hours) at 01/28/2023 0742 Last data filed at 01/27/2023 1500 Gross per 24 hour  Intake 1008.17 ml  Output --  Net 1008.17 ml   Filed Weights   01/27/23 0955  Weight: 75.8 kg    Exam: General exam: In no acute distress. Respiratory system: Good air movement and clear to auscultation. Cardiovascular system: S1 & S2 heard, RRR. No JVD. Gastrointestinal system: Abdomen is nondistended, soft and nontender.  Extremities: No pedal edema. Skin: No rashes,  lesions or ulcers Psychiatry: Judgement and insight appear normal. Mood & affect appropriate.    Data Reviewed:    Labs: Basic Metabolic Panel: Recent Labs  Lab 01/27/23 1028 01/28/23 0555  NA 140 137  K 3.8 3.8  CL 107 105  CO2 24 25  GLUCOSE 107* 92  BUN 12 13  CREATININE 0.54* 0.55*  CALCIUM 8.9 8.3*   GFR Estimated Creatinine Clearance: 84.7 mL/min (A) (by C-G formula based on SCr of 0.55 mg/dL (L)). Liver Function Tests: No results for input(s): "AST", "ALT", "ALKPHOS", "BILITOT", "PROT", "ALBUMIN" in the last 168 hours. No results for input(s): "LIPASE", "AMYLASE" in the last 168 hours. No results for input(s): "AMMONIA" in the last 168  hours. Coagulation profile No results for input(s): "INR", "PROTIME" in the last 168 hours. COVID-19 Labs  Recent Labs    01/27/23 1028  DDIMER 0.59*    No results found for: "SARSCOV2NAA"  CBC: Recent Labs  Lab 01/27/23 1028 01/28/23 0555  WBC 10.6* 7.3  NEUTROABS 8.4*  --   HGB 12.1* 10.4*  HCT 36.4* 32.0*  MCV 91.0 91.7  PLT 220 214   Cardiac Enzymes: No results for input(s): "CKTOTAL", "CKMB", "CKMBINDEX", "TROPONINI" in the last 168 hours. BNP (last 3 results) No results for input(s): "PROBNP" in the last 8760 hours. CBG: No results for input(s): "GLUCAP" in the last 168 hours. D-Dimer: Recent Labs    01/27/23 1028  DDIMER 0.59*   Hgb A1c: No results for input(s): "HGBA1C" in the last 72 hours. Lipid Profile: No results for input(s): "CHOL", "HDL", "LDLCALC", "TRIG", "CHOLHDL", "LDLDIRECT" in the last 72 hours. Thyroid function studies: No results for input(s): "TSH", "T4TOTAL", "T3FREE", "THYROIDAB" in the last 72 hours.  Invalid input(s): "FREET3" Anemia work up: No results for input(s): "VITAMINB12", "FOLATE", "FERRITIN", "TIBC", "IRON", "RETICCTPCT" in the last 72 hours. Sepsis Labs: Recent Labs  Lab 01/27/23 1028 01/28/23 0555  WBC 10.6* 7.3   Microbiology No results found for this or any previous visit (from the past 240 hour(s)).   Medications:    atorvastatin  20 mg Oral QHS   enoxaparin (LOVENOX) injection  40 mg Subcutaneous Q24H   feeding supplement  1 Container Oral BID BM   psyllium  1 packet Oral Daily   senna  1 tablet Oral BID   Continuous Infusions:  sodium chloride 50 mL/hr at 01/27/23 1449      LOS: 0 days   Marinda Elk  [facility]  01/28/2023, 7:42 AM

## 2023-01-28 NOTE — Plan of Care (Signed)
  Problem: Education: Goal: Knowledge of the prescribed therapeutic regimen will improve Outcome: Progressing   Problem: Activity: Goal: Will remain free from falls Outcome: Progressing   Problem: Clinical Measurements: Goal: Ability to maintain clinical measurements within normal limits will improve Outcome: Progressing   Problem: Education: Goal: Knowledge of General Education information will improve Description: Including pain rating scale, medication(s)/side effects and non-pharmacologic comfort measures Outcome: Progressing   Problem: Clinical Measurements: Goal: Ability to maintain clinical measurements within normal limits will improve Outcome: Progressing   Problem: Pain Managment: Goal: General experience of comfort will improve Outcome: Progressing   Problem: Safety: Goal: Ability to remain free from injury will improve Outcome: Progressing

## 2023-01-28 NOTE — Care Management Obs Status (Signed)
MEDICARE OBSERVATION STATUS NOTIFICATION   Patient Details  Name: Andrew Stuart MRN: [id number] Date of Birth: 10-Nov-1950   Medicare Observation Status Notification Given:  Yes    Adrian Prows, RN 01/28/2023, 2:10 PM

## 2023-01-28 NOTE — Evaluation (Addendum)
Occupational Therapy Evaluation Patient Details Name: Andrew Stuart MRN: [id number] DOB: [birth year] Today's Date: 01/28/2023   History of Present Illness Patient is a 72 year old male who presented to the hosptial on 9/28 after two syncopal episodes. patient was admitted with Syncope probably due to orthostatic hypotension, leukocytosis, hyperlipidemia.   PMH: s/p Posterior cervical laminoplasty C3-7 with left-sided C3-5 foraminotomies and left-sided C6-7 foraminotomies.   Clinical Impression   Patient is a 72 year old male who presented for above. Patient was living at home with husband support. Patient is currently max A for UB and LB dressing/bathing tasks with increased pain in neck and recent syncopal episodes. Patient was not orthostatic as noted by Bps in chart during session. Patient would benefit from continued OT services to increased independence and reduce caregiver burden for ADLs in next level of care.       If plan is discharge home, recommend the following: A little help with walking and/or transfers;A little help with bathing/dressing/bathroom;Assistance with cooking/housework;Assist for transportation;Help with stairs or ramp for entrance    Functional Status Assessment  Patient has had a recent decline in their functional status and demonstrates the ability to make significant improvements in function in a reasonable and predictable amount of time.  Equipment Recommendations  Other (comment);Tub/shower seat (total hip kit)       Precautions / Restrictions Precautions Precautions: Cervical;Fall Precaution Booklet Issued: Yes (comment) Restrictions Weight Bearing Restrictions: No Other Position/Activity Restrictions: no brace per previous hospitalization notes.      Mobility Bed Mobility Overal bed mobility: Modified Independent             General bed mobility comments: HOB elevated, use of rails but no physical assist    Transfers Overall transfer level:  Needs assistance Equipment used: Rolling walker (2 wheels) Transfers: Sit to/from Stand Sit to Stand: Contact guard assist           General transfer comment: Min steady assist only      Balance Overall balance assessment: Needs assistance Sitting-balance support: No upper extremity supported, Feet supported Sitting balance-Leahy Scale: Good     Standing balance support: No upper extremity supported Standing balance-Leahy Scale: Fair                             ADL either performed or assessed with clinical judgement   ADL Overall ADL's : Needs assistance/impaired     Grooming: Minimal assistance;Sitting     Upper Body Bathing Details (indicate cue type and reason): patient reported that his husband washed him up this AM.         Lower Body Dressing: Maximal assistance;Sitting/lateral leans Lower Body Dressing Details (indicate cue type and reason): sittign EOB with patient unable to bring feet to lap in figure four positioning. Toilet Transfer: Software engineer Details (indicate cue type and reason): BPs were assessed as noted in chart to chec for orthostatics Toileting- Clothing Manipulation and Hygiene: Sit to/from stand;Minimal assistance         General ADL Comments: patient was able to particiapte in figure four postiioning of BLE but reported that it caused increased pain. patient reported that his husband helps him with all ADl tasks at home and would continue to do so while he recovered.     Vision Patient Visual Report: No change from baseline              Pertinent Vitals/Pain Pain Assessment  Pain Assessment: Faces Faces Pain Scale: Hurts little more Pain Location: posterior neck Pain Descriptors / Indicators: Tightness Pain Intervention(s): Monitored during session     Extremity/Trunk Assessment Upper Extremity Assessment Upper Extremity Assessment: Right hand dominant   Lower Extremity Assessment Lower  Extremity Assessment: Generalized weakness   Cervical / Trunk Assessment Cervical / Trunk Assessment: Neck Surgery   Communication Communication Communication: No apparent difficulties Cueing Techniques: Verbal cues   Cognition Arousal: Alert Behavior During Therapy: WFL for tasks assessed/performed Overall Cognitive Status: Within Functional Limits for tasks assessed                      Home Living Family/patient expects to be discharged to:: Private residence Living Arrangements: Spouse/significant other           Home Layout: Two level;Able to live on main level with bedroom/bathroom     Bathroom Shower/Tub: Producer, television/film/video: Standard     Home Equipment: Agricultural consultant (2 wheels);Shower seat          Prior Functioning/Environment Prior Level of Function : Independent/Modified Independent;Driving             Mobility Comments: not using AD prior to admission ADLs Comments: indep        OT Problem List: Decreased strength;Decreased knowledge of precautions;Decreased knowledge of use of DME or AE      OT Treatment/Interventions: Self-care/ADL training;Therapeutic activities;DME and/or AE instruction;Patient/family education    OT Goals(Current goals can be found in the care plan section) Acute Rehab OT Goals OT Goal Formulation: With patient Time For Goal Achievement: 02/11/23 Potential to Achieve Goals: Fair  OT Frequency: Min 1X/week    Co-evaluation PT/OT/SLP Co-Evaluation/Treatment: Yes Reason for Co-Treatment: To address functional/ADL transfers PT goals addressed during session: Mobility/safety with mobility OT goals addressed during session: ADL's and self-care      AM-PAC OT "6 Clicks" Daily Activity     Outcome Measure Help from another person eating meals?: None Help from another person taking care of personal grooming?: None Help from another person toileting, which includes using toliet, bedpan, or urinal?:  None Help from another person bathing (including washing, rinsing, drying)?: A Lot Help from another person to put on and taking off regular upper body clothing?: A Little Help from another person to put on and taking off regular lower body clothing?: A Lot 6 Click Score: 19   End of Session Equipment Utilized During Treatment: Rolling walker (2 wheels);Gait belt  Activity Tolerance: Patient tolerated treatment well Patient left: in chair;with call bell/phone within reach;with family/visitor present  OT Visit Diagnosis: Other abnormalities of gait and mobility (R26.89)                Time: 1610-9604 OT Time Calculation (min): 24 min Charges:  OT General Charges $OT Visit: 1 Visit OT Evaluation $OT Eval Low Complexity: 1 Low  Ladashia Demarinis OTR/L, MS Acute Rehabilitation Department Office# [phone]   Selinda Flavin 01/28/2023, 3:12 PM

## 2023-01-28 NOTE — TOC Initial Note (Signed)
Transition of Care [facility]) - Initial/Assessment Note    Patient Details  Name: Andrew Stuart MRN: [id number] Date of Birth: [birth year]  [facility]) CM/SW Contact:    Adrian Prows, RN Phone Number: 01/28/2023, 1:30 PM  Clinical Narrative:                 Spoke w/ pt and husband Alvy Bimler [phone]) in room; pt says he lives at home; he plans to return at d/c; they deny SDOH risks; pt has transportation; pt says he has glasses; pt also has walker and shower chair; there are grab bars in shower; pt does not have HH services or home oxygen; awaiting PT eval; TOC will follow.  Expected Discharge Plan: Home/Self Care Barriers to Discharge: Continued Medical Work up   Patient Goals and CMS Choice Patient states their goals for this hospitalization and ongoing recovery are:: home          Expected Discharge Plan and Services   Discharge Planning Services: CM Consult   Living arrangements for the past 2 months: Single Family Home Expected Discharge Date: 01/28/23                                    Prior Living Arrangements/Services Living arrangements for the past 2 months: Single Family Home Lives with:: Spouse Patient language and need for interpreter reviewed:: Yes Do you feel safe going back to the place where you live?: Yes      Need for Family Participation in Patient Care: Yes (Comment) Care giver support system in place?: Yes (comment) Current home services: DME (walker, shower chair) Criminal Activity/Legal Involvement Pertinent to Current Situation/Hospitalization: No - Comment as needed  Activities of Daily Living Home Assistive Devices/Equipment: Eyeglasses ADL Screening (condition at time of admission) Does the patient have a NEW difficulty with bathing/dressing/toileting/self-feeding that is expected to last >3 days?: No Does the patient have a NEW difficulty with getting in/out of bed, walking, or climbing stairs that is  expected to last >3 days?: Yes (Initiates electronic notice to provider for possible PT consult) Does the patient have a NEW difficulty with communication that is expected to last >3 days?: No Is the patient deaf or have difficulty hearing?: No Does the patient have difficulty seeing, even when wearing glasses/contacts?: No Does the patient have difficulty concentrating, remembering, or making decisions?: No  Permission Sought/Granted Permission sought to share information with : Case Manager Permission granted to share information with : Yes, Verbal Permission Granted  Share Information with NAME: Case Manager     Permission granted to share info w Relationship: Alvy Bimler (spouse) [phone]     Emotional Assessment Appearance:: Appears stated age Attitude/Demeanor/Rapport: Gracious Affect (typically observed): Accepting Orientation: : Oriented to Self, Oriented to Place, Oriented to  Time, Oriented to Situation Alcohol / Substance Use: Not Applicable Psych Involvement: No (comment)  Admission diagnosis:  Syncope [R55] Syncope, unspecified syncope type [R55] Patient Active Problem List   Diagnosis Date Noted   Syncope 01/27/2023   Cervical myelopathy (HCC) 01/24/2023   Cervical spinal stenosis 01/24/2023   Myelomalacia (HCC) 01/24/2023   Precordial pain 01/08/2023   Subacute cough 01/08/2023   Cauda equina compression (HCC) 11/06/2022   Spinal stenosis, lumbar region, 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   Facet arthropathy, lumbar 08/16/2022   Right lumbar radiculitis 07/05/2022   Chronic right-sided low back pain with right-sided sciatica 07/05/2022   Primary  hypertension 03/09/2022   Encounter for general adult medical examination with abnormal findings 01/04/2022   Idiopathic progressive neuropathy 10/06/2021   Hyperglycemia 10/06/2021   Varicose veins of both lower extremities with pain 10/06/2021   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)  03/01/2021   Abnormal electrocardiogram (ECG) (EKG) 03/01/2021   Vitamin B12 deficiency 02/18/2021   Chronic venous insufficiency 02/17/2021   Hyperlipidemia with target LDL less than 130 08/26/2020   BPH without obstruction/lower urinary tract symptoms 08/26/2020   Carpal tunnel syndrome on left 04/28/2020   Nephrolithiasis 03/02/2015   PCP:  [name], MD Pharmacy:   [id number] [place], [place] - [address] [place] [address] Phone: [phone] Fax: [phone]     Social Determinants of Health (SDOH) Social History: SDOH Screenings   Food Insecurity: No Food Insecurity (01/28/2023)  Housing: Low Risk  (01/28/2023)  Transportation Needs: No Transportation Needs (01/28/2023)  Utilities: Not At Risk (01/28/2023)  Alcohol Screen: Low Risk  (01/02/2023)  Depression (PHQ2-9): Low Risk  (01/02/2023)  Financial Resource Strain: Low Risk  (01/02/2023)  Physical Activity: Sufficiently Active (01/02/2023)  Social Connections: Socially Isolated (01/02/2023)  Stress: No Stress Concern Present (01/02/2023)  Tobacco Use: Low Risk  (01/27/2023)  Health Literacy: Adequate Health Literacy (01/02/2023)   SDOH Interventions: Food Insecurity Interventions: Intervention Not Indicated, Inpatient TOC Housing Interventions: Intervention Not Indicated, Inpatient TOC Transportation Interventions: Intervention Not Indicated, Inpatient TOC Utilities Interventions: Intervention Not Indicated, Inpatient TOC   Readmission Risk Interventions     No data to display

## 2023-01-28 NOTE — Evaluation (Signed)
Physical Therapy Evaluation Patient Details Name: Andrew Stuart MRN: [id number] DOB: Jun 25, 1950 Today's Date: 01/28/2023  History of Present Illness  Patient is a 72 year old male who presented to the hosptial on 9/28 after two syncopal episodes. patient was admitted with Syncope probably due to orthostatic hypotension, leukocytosis, hyperlipidemia.   PMH: s/p Posterior cervical laminoplasty C3-7 with left-sided C3-5 foraminotomies and left-sided C6-7 foraminotomies.  Clinical Impression  Pt admitted as above and presenting with functional mobility limitations 2* mild ambulatory balance deficits, post op pain, and decreased endurance.  Pt should progress well to dc home with family assist.        If plan is discharge home, recommend the following: A little help with walking and/or transfers;A little help with bathing/dressing/bathroom;Assistance with cooking/housework;Assist for transportation;Help with stairs or ramp for entrance   Can travel by private vehicle   Yes    Equipment Recommendations None recommended by PT  Recommendations for Other Services       Functional Status Assessment Patient has had a recent decline in their functional status and demonstrates the ability to make significant improvements in function in a reasonable and predictable amount of time.     Precautions / Restrictions Precautions Precautions: Cervical;Fall Precaution Booklet Issued: Yes (comment) Restrictions Weight Bearing Restrictions: No Other Position/Activity Restrictions: no brace per previous hospitalization notes.      Mobility  Bed Mobility Overal bed mobility: Modified Independent             General bed mobility comments: HOB elevated, use of rails but no physical assist    Transfers Overall transfer level: Needs assistance Equipment used: Rolling walker (2 wheels) Transfers: Sit to/from Stand Sit to Stand: Contact guard assist           General transfer comment: Min  steady assist only    Ambulation/Gait Ambulation/Gait assistance: Modified independent (Device/Increase time) Gait Distance (Feet): 350 Feet Assistive device: Rolling walker (2 wheels), None Gait Pattern/deviations: Step-through pattern       General Gait Details: Min cues for position from RW; no LOB  Stairs            Wheelchair Mobility     Tilt Bed    Modified Rankin (Stroke Patients Only)       Balance Overall balance assessment: Needs assistance Sitting-balance support: No upper extremity supported, Feet supported Sitting balance-Leahy Scale: Good     Standing balance support: No upper extremity supported Standing balance-Leahy Scale: Fair                               Pertinent Vitals/Pain Pain Assessment Pain Assessment: Faces Faces Pain Scale: Hurts little more Pain Location: posterior neck Pain Descriptors / Indicators: Tightness Pain Intervention(s): Limited activity within patient's tolerance, Monitored during session, Premedicated before session    Home Living Family/patient expects to be discharged to:: Private residence Living Arrangements: Spouse/significant other             Home Layout: Two level;Able to live on main level with bedroom/bathroom Home Equipment: Rolling Walker (2 wheels);Shower seat      Prior Function Prior Level of Function : Independent/Modified Independent;Driving             Mobility Comments: not using AD prior to admission ADLs Comments: indep     Extremity/Trunk Assessment   Upper Extremity Assessment Upper Extremity Assessment: Right hand dominant    Lower Extremity Assessment Lower Extremity Assessment: Generalized weakness  Cervical / Trunk Assessment Cervical / Trunk Assessment: Neck Surgery  Communication   Communication Communication: No apparent difficulties Cueing Techniques: Verbal cues  Cognition Arousal: Alert Behavior During Therapy: WFL for tasks  assessed/performed Overall Cognitive Status: Within Functional Limits for tasks assessed                                          General Comments      Exercises     Assessment/Plan    PT Assessment Patient needs continued PT services  PT Problem List Decreased activity tolerance;Decreased mobility;Decreased strength;Decreased knowledge of precautions;Decreased skin integrity       PT Treatment Interventions DME instruction;Gait training;Stair training;Functional mobility training;Therapeutic activities;Therapeutic exercise;Balance training;Patient/family education    PT Goals (Current goals can be found in the Care Plan section)  Acute Rehab PT Goals Patient Stated Goal: to return home PT Goal Formulation: With patient/family Time For Goal Achievement: 02/11/23 Potential to Achieve Goals: Good    Frequency Min 1X/week     Co-evaluation PT/OT/SLP Co-Evaluation/Treatment: Yes Reason for Co-Treatment: To address functional/ADL transfers PT goals addressed during session: Mobility/safety with mobility OT goals addressed during session: ADL's and self-care       AM-PAC PT "6 Clicks" Mobility  Outcome Measure Help needed turning from your back to your side while in a flat bed without using bedrails?: A Little Help needed moving from lying on your back to sitting on the side of a flat bed without using bedrails?: A Little Help needed moving to and from a bed to a chair (including a wheelchair)?: A Little Help needed standing up from a chair using your arms (e.g., wheelchair or bedside chair)?: A Little Help needed to walk in hospital room?: A Little Help needed climbing 3-5 steps with a railing? : A Little 6 Click Score: 18    End of Session Equipment Utilized During Treatment: Gait belt Activity Tolerance: Patient tolerated treatment well Patient left: in bed;with call bell/phone within reach Nurse Communication: Mobility status PT Visit Diagnosis:  Muscle weakness (generalized) (M62.81)    Time: 1610-9604 PT Time Calculation (min) (ACUTE ONLY): 27 min   Charges:   PT Evaluation $PT Eval Low Complexity: 1 Low   PT General Charges $$ ACUTE PT VISIT: 1 Visit         Mauro Kaufmann PT Acute Rehabilitation Services Pager [phone] Office [phone]   Shalisa Mcquade 01/28/2023, 12:44 PM

## 2023-01-29 ENCOUNTER — Ambulatory Visit: Payer: Medicare HMO | Admitting: Physical Medicine and Rehabilitation

## 2023-01-29 DIAGNOSIS — R55 Syncope and collapse: Secondary | ICD-10-CM | POA: Diagnosis not present

## 2023-01-29 NOTE — Discharge Summary (Signed)
Physician Discharge Summary  Andrew Stuart:[id number] DOB: Jan 14, 1951 DOA: 01/27/2023  PCP: [name], MD  Admit date: 01/27/2023 Discharge date: 01/29/2023  Admitted From: Home Disposition:  Home  Recommendations for Outpatient Follow-up:  Follow up withNeurosurgery in 1-2 weeks Please obtain BMP/CBC in one week   Home Health:No Equipment/Devices:None  Discharge Condition:Stable CODE STATUS:Full Diet recommendation: Heart Healthy   Brief/Interim Summary: 72 y.o. male past medical history significant for cervical myelopathy who underwent C3 C7 laminoplasty on 01/24/2023 with Dr. Marcell Barlow at [facility] was discharged the following day presents to the ER with syncope he relates dizziness upon standing with lightheadedness sat in the bathroom to urinate stand up and syncopized.  After that he had another 2 episodes also happen when he stood up.    Discharge Diagnoses:  Principal Problem:   Syncope Active Problems:   Hyperlipidemia with target LDL less than 130   Primary hypertension  Syncope due to orthostatic hypotension: Likely multifactorial in the setting of medication viral infection. Orthostatics were negative on admission he was fluid resuscitated. Had no further episodes of syncope no events on telemetry, 2D echo showed a preserved EF no aortic stenosis. Antihypertensive medication were held he was started on IV fluids his symptoms resolved.  Leukocytosis: Likely reactive remained afebrile.  Status post laminectomy: Continue narcotics for pain control follow-up with neurosurgery as an outpatient.  Hyperlipidemia:  continue statins.  Essential hypertension: Antihypertensive medication held on admission they will be resumed as an outpatient blood pressure is trending up  Discharge Instructions  Discharge Instructions     Diet - low sodium heart healthy   Complete by: As directed    Increase activity slowly   Complete by: As directed        Allergies as of 01/29/2023       Reactions   Bactrim [sulfamethoxazole-trimethoprim] Rash   Azithromycin Swelling   Bee Pollen    Blueberry Flavor Rash   Can have, but not an abundance.   Strawberry Extract Rash   Can have, but not an abundance.         Medication List     STOP taking these medications    gabapentin 100 MG capsule Commonly known as: Neurontin       TAKE these medications    acetaminophen 500 MG tablet Commonly known as: TYLENOL Take 500 mg by mouth every 6 (six) hours as needed for moderate pain.   ALEVE PO Take 220 tablets by mouth every 6 (six) hours as needed (Pain).   atorvastatin 20 MG tablet Commonly known as: LIPITOR TAKE 1 TABLET BY MOUTH EVERY DAY   celecoxib 200 MG capsule Commonly known as: CeleBREX Take 1 capsule (200 mg total) by mouth 2 (two) times daily.   MAGNESIUM PO Take 250 mg by mouth daily.   methocarbamol 500 MG tablet Commonly known as: ROBAXIN Take 1 tablet (500 mg total) by mouth every 8 (eight) hours as needed for muscle spasms.   oxyCODONE 5 MG immediate release tablet Commonly known as: Oxy IR/ROXICODONE Take 1 tablet (5 mg total) by mouth every 4 (four) hours as needed for moderate pain ((score 4 to 6)).   psyllium 58.6 % packet Commonly known as: METAMUCIL Take 1 packet by mouth daily.   senna 8.6 MG Tabs tablet Commonly known as: SENOKOT Take 1 tablet (8.6 mg total) by mouth 2 (two) times daily.   VITAMIN B1-B12 IJ Inject 1 Dose as directed every 30 (thirty) days.  Allergies  Allergen Reactions   Bactrim [Sulfamethoxazole-Trimethoprim] Rash   Azithromycin Swelling   Bee Pollen    Blueberry Flavor Rash    Can have, but not an abundance.   Strawberry Extract Rash    Can have, but not an abundance.     Consultations: None   Procedures/Studies: ECHOCARDIOGRAM COMPLETE  Result Date: 01/28/2023    ECHOCARDIOGRAM REPORT   Patient Name:   Andrew Stuart Date of Exam: 01/28/2023  Medical Rec #:  [id number]      Height:       69.0 in Accession #:    [id number]     Weight:       167.0 lb Date of Birth:  09/12/1950       BSA:          1.914 m Patient Age:    72 years       BP:           129/70 mmHg Patient Gender: M              HR:           85 bpm. Exam Location:  Inpatient Procedure: 2D Echo, Color Doppler and Cardiac Doppler Indications:    Syncope  History:        Patient has no prior history of Echocardiogram examinations.                 Signs/Symptoms:Syncope; Risk Factors:Hypertension and                 Dyslipidemia.  Sonographer:    Milbert Coulter Referring Phys: [id number] Tamari Redwine FELIZ ORTIZ  Sonographer Comments: Suboptimal parasternal window. Image acquisition challenging due to patient body habitus. IMPRESSIONS  1. Left ventricular ejection fraction, by estimation, is 60 to 65%. The left ventricle has normal function. The left ventricle has no regional wall motion abnormalities. Left ventricular diastolic parameters were normal.  2. Right ventricular systolic function is normal. The right ventricular size is normal. Tricuspid regurgitation signal is inadequate for assessing PA pressure.  3. The mitral valve is grossly normal. No evidence of mitral valve regurgitation.  4. The aortic valve was not well visualized. Aortic valve regurgitation is not visualized. No aortic stenosis is present.  5. Aortic not visualized. FINDINGS  Left Ventricle: Left ventricular ejection fraction, by estimation, is 60 to 65%. The left ventricle has normal function. The left ventricle has no regional wall motion abnormalities. The left ventricular internal cavity size was normal in size. There is  no left ventricular hypertrophy. Left ventricular diastolic parameters were normal. Right Ventricle: The right ventricular size is normal. Right ventricular systolic function is normal. Tricuspid regurgitation signal is inadequate for assessing PA pressure. Left Atrium: Left atrial size was normal in size. Right  Atrium: Right atrial size was normal in size. Pericardium: There is no evidence of pericardial effusion. Mitral Valve: The mitral valve is grossly normal. No evidence of mitral valve regurgitation. Tricuspid Valve: Tricuspid valve regurgitation is not demonstrated. Aortic Valve: The aortic valve was not well visualized. Aortic valve regurgitation is not visualized. No aortic stenosis is present. Aortic valve mean gradient measures 6.0 mmHg. Aortic valve peak gradient measures 10.5 mmHg. Aortic valve area, by VTI measures 2.20 cm. Pulmonic Valve: Pulmonic valve regurgitation is not visualized. Aorta: Not visualized. IAS/Shunts: No atrial level shunt detected by color flow Doppler.  LEFT VENTRICLE PLAX 2D LVIDd:         4.20 cm   Diastology LVIDs:  2.60 cm   LV e' medial:    10.70 cm/s LV PW:         1.00 cm   LV E/e' medial:  6.9 LV IVS:        0.90 cm   LV e' lateral:   11.20 cm/s LVOT diam:     1.90 cm   LV E/e' lateral: 6.5 LV SV:         63 LV SV Index:   33 LVOT Area:     2.84 cm  RIGHT VENTRICLE RV Basal diam:  3.50 cm RV Mid diam:    1.80 cm RV S prime:     15.80 cm/s TAPSE (M-mode): 2.7 cm LEFT ATRIUM             Index        RIGHT ATRIUM           Index LA diam:        3.10 cm 1.62 cm/m   RA Area:     16.40 cm LA Vol (A2C):   42.4 ml 22.16 ml/m  RA Volume:   44.50 ml  23.25 ml/m LA Vol (A4C):   37.7 ml 19.70 ml/m LA Biplane Vol: 40.2 ml 21.01 ml/m  AORTIC VALVE AV Area (Vmax):    2.08 cm AV Area (Vmean):   2.18 cm AV Area (VTI):     2.20 cm AV Vmax:           162.00 cm/s AV Vmean:          109.000 cm/s AV VTI:            0.287 m AV Peak Grad:      10.5 mmHg AV Mean Grad:      6.0 mmHg LVOT Vmax:         119.00 cm/s LVOT Vmean:        84.000 cm/s LVOT VTI:          0.223 m LVOT/AV VTI ratio: 0.78 MITRAL VALVE MV Area (PHT): 2.99 cm    SHUNTS MV Decel Time: 254 msec    Systemic VTI:  0.22 m MV E velocity: 73.30 cm/s  Systemic Diam: 1.90 cm MV A velocity: 73.30 cm/s MV E/A ratio:  1.00 Halford Decamp signed by Carolan Clines Signature Date/Time: 01/28/2023/10:16:58 AM    Final    DG Cervical Spine 2-3 Views  Result Date: 01/24/2023 CLINICAL DATA:  Elective surgery. EXAM: CERVICAL SPINE - 2-3 VIEW COMPARISON:  None Available. FINDINGS: Three fluoroscopic spot views of the cervical spine obtained in the operating room in frontal and lateral projections. Posterior hardware projects over C3, C4, C5, C6, and C7. Fluoroscopy time 2 seconds. Fluoroscopy dose 0.2185 mGy. IMPRESSION: Intraoperative fluoroscopy during cervical spine surgery. Electronically Signed   By: Narda Rutherford M.D.   On: 01/24/2023 13:29   DG C-Arm 1-60 Min-No Report  Result Date: 01/24/2023 Fluoroscopy was utilized by the requesting physician.  No radiographic interpretation.   DG C-Arm 1-60 Min-No Report  Result Date: 01/24/2023 Fluoroscopy was utilized by the requesting physician.  No radiographic interpretation.   DG C-Arm 1-60 Min-No Report  Result Date: 01/24/2023 Fluoroscopy was utilized by the requesting physician.  No radiographic interpretation.   DG Chest 2 View  Result Date: 01/08/2023 CLINICAL DATA:  Chest pain, cough EXAM: CHEST - 2 VIEW COMPARISON:  None Available. FINDINGS: The heart size and mediastinal contours are within normal limits. Both lungs are clear. Disc degenerative disease and bridging osteophytosis throughout  the thoracic spine in keeping with DISH or ankylosing spondylosis. IMPRESSION: 1. No acute abnormality of the lungs. 2. 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 Electronically Signed   By: Jearld Lesch M.D.   On: 01/08/2023 14:47   (Echo, Carotid, EGD, Colonoscopy, ERCP)    Subjective: Feels much better than when he came in with symptoms are almost resolved.  Discharge Exam: Vitals:   01/28/23 1950 01/29/23 0439  BP: (!) 152/75 (!) 156/88  Pulse: 91 88  Resp: 18 20  Temp:  98.7 F (37.1 C) 98.1 F (36.7 C)  SpO2: 98% 98%   Vitals:   01/28/23 0238 01/28/23 1430 01/28/23 1950 01/29/23 0439  BP: 129/70 128/80 (!) 152/75 (!) 156/88  Pulse: 84 88 91 88  Resp: 18 18 18 20   Temp: 97.6 F (36.4 C) 98.8 F (37.1 C) 98.7 F (37.1 C) 98.1 F (36.7 C)  TempSrc: Oral Oral Oral Oral  SpO2: 98% 98% 98% 98%  Weight:      Height:        General: Pt is alert, awake, not in acute distress Cardiovascular: RRR, S1/S2 +, no rubs, no gallops Respiratory: CTA bilaterally, no wheezing, no rhonchi Abdominal: Soft, NT, ND, bowel sounds + Extremities: no edema, no cyanosis    The results of significant diagnostics from this hospitalization (including imaging, microbiology, ancillary and laboratory) are listed below for reference.     Microbiology: No results found for this or any previous visit (from the past 240 hour(s)).   Labs: BNP (last 3 results) No results for input(s): "BNP" in the last 8760 hours. Basic Metabolic Panel: Recent Labs  Lab 01/27/23 1028 01/28/23 0555  NA 140 137  K 3.8 3.8  CL 107 105  CO2 24 25  GLUCOSE 107* 92  BUN 12 13  CREATININE 0.54* 0.55*  CALCIUM 8.9 8.3*   Liver Function Tests: No results for input(s): "AST", "ALT", "ALKPHOS", "BILITOT", "PROT", "ALBUMIN" in the last 168 hours. No results for input(s): "LIPASE", "AMYLASE" in the last 168 hours. No results for input(s): "AMMONIA" in the last 168 hours. CBC: Recent Labs  Lab 01/27/23 1028 01/28/23 0555  WBC 10.6* 7.3  NEUTROABS 8.4*  --   HGB 12.1* 10.4*  HCT 36.4* 32.0*  MCV 91.0 91.7  PLT 220 214   Cardiac Enzymes: No results for input(s): "CKTOTAL", "CKMB", "CKMBINDEX", "TROPONINI" in the last 168 hours. BNP: Invalid input(s): "POCBNP" CBG: No results for input(s): "GLUCAP" in the last 168 hours. D-Dimer Recent Labs    01/27/23 1028  DDIMER 0.59*   Hgb A1c No results for input(s): "HGBA1C" in the last 72 hours. Lipid Profile No results for  input(s): "CHOL", "HDL", "LDLCALC", "TRIG", "CHOLHDL", "LDLDIRECT" in the last 72 hours. Thyroid function studies No results for input(s): "TSH", "T4TOTAL", "T3FREE", "THYROIDAB" in the last 72 hours.  Invalid input(s): "FREET3" Anemia work up No results for input(s): "VITAMINB12", "FOLATE", "FERRITIN", "TIBC", "IRON", "RETICCTPCT" in the last 72 hours. Urinalysis    Component Value Date/Time   COLORURINE YELLOW 01/08/2023 1337   APPEARANCEUR CLEAR 01/08/2023 1337   LABSPEC <=1.005 (A) 01/08/2023 1337   PHURINE 6.0 01/08/2023 1337   GLUCOSEU NEGATIVE 01/08/2023 1337   HGBUR NEGATIVE 01/08/2023 1337   BILIRUBINUR NEGATIVE 01/08/2023 1337   KETONESUR NEGATIVE 01/08/2023 1337   UROBILINOGEN 0.2 01/08/2023 1337   NITRITE NEGATIVE 01/08/2023 1337   LEUKOCYTESUR NEGATIVE 01/08/2023 1337   Sepsis Labs Recent Labs  Lab 01/27/23  1028 01/28/23 0555  WBC 10.6* 7.3   Microbiology No results found for this or any previous visit (from the past 240 hour(s)).   Time coordinating discharge: Over 35 minutes  SIGNED:   Marinda Elk, MD  [facility] 01/29/2023, 8:52 AM Pager   If 7PM-7AM, please contact night-coverage [email or web address] Password [email or web address]

## 2023-02-07 NOTE — Progress Notes (Unsigned)
   REFERRING PHYSICIAN:  [name], Md [address] [place],  [place] [address]  DOS: 01/24/23  posterior laminoplasty C3-C7 for myelopathy  HISTORY OF PRESENT ILLNESS: Andrew Stuart is 2 weeks status post above surgery. Given celebrex, robaxin, and oxycodone on discharge from the hospital.   Was admitted on 01/27/23 due to syncope.   He is not taking oxycodone or neurontin. He has only taken 1 robaxin since his surgery. He is taking celebrex. He has some expected posterior neck pain and tightness. He feels like he is improving. Walking is a little better.    PHYSICAL EXAMINATION:  NEUROLOGICAL:  General: In no acute distress.   Awake, alert, oriented to person, place, and time.  Pupils equal round and reactive to light.  Facial tone is symmetric.    Strength: Side Biceps Triceps Deltoid Interossei Grip Wrist Ext. Wrist Flex.  R 5 5 5 5 5 5 5   L 5 5 5 5 5 5 5    Incision c/d/I, staples were removed  Imaging:  Nothing new to review.   Assessment / Plan: Andrew Stuart is doing well s/p above surgery. Treatment options reviewed with patient and following plan made:   - We discussed activity escalation and I have advised the patient to lift up to 10 pounds until 6 weeks after surgery (until follow up with Dr. Myer Haff).   - Reviewed wound care.  - Continue current medications including celebrex and prn robaxin.  - Follow up as scheduled in 4 weeks and prn.   Advised to contact the office if any questions or concerns arise.   Drake Leach PA-C Dept of Neurosurgery

## 2023-02-08 ENCOUNTER — Ambulatory Visit: Payer: Medicare HMO | Admitting: Orthopedic Surgery

## 2023-02-08 ENCOUNTER — Encounter: Payer: Self-pay | Admitting: Orthopedic Surgery

## 2023-02-08 ENCOUNTER — Encounter: Payer: Medicare HMO | Admitting: Neurosurgery

## 2023-02-08 VITALS — BP 130/70 | Temp 97.8°F | Ht 69.0 in | Wt 167.0 lb

## 2023-02-08 DIAGNOSIS — Z09 Encounter for follow-up examination after completed treatment for conditions other than malignant neoplasm: Secondary | ICD-10-CM

## 2023-02-08 DIAGNOSIS — G959 Disease of spinal cord, unspecified: Secondary | ICD-10-CM

## 2023-02-09 ENCOUNTER — Ambulatory Visit: Payer: Medicare HMO

## 2023-02-09 DIAGNOSIS — E538 Deficiency of other specified B group vitamins: Secondary | ICD-10-CM | POA: Diagnosis not present

## 2023-02-09 MED ORDER — CYANOCOBALAMIN 1000 MCG/ML IJ SOLN
1000.0000 ug | Freq: Once | INTRAMUSCULAR | Status: AC
Start: 2023-02-09 — End: 2023-02-09
  Administered 2023-02-09: 1000 ug via INTRAMUSCULAR

## 2023-02-09 NOTE — Progress Notes (Signed)
B12 given.  Pt tolerated well. Pt is aware to give the office a call for an side effects or reactions. Please co-sign.   

## 2023-02-14 ENCOUNTER — Ambulatory Visit: Payer: Medicare HMO | Admitting: Physical Medicine and Rehabilitation

## 2023-02-21 ENCOUNTER — Other Ambulatory Visit: Payer: Self-pay | Admitting: Neurosurgery

## 2023-02-21 DIAGNOSIS — Z9889 Other specified postprocedural states: Secondary | ICD-10-CM

## 2023-02-21 DIAGNOSIS — G959 Disease of spinal cord, unspecified: Secondary | ICD-10-CM

## 2023-02-21 NOTE — Telephone Encounter (Signed)
Patient notified

## 2023-02-21 NOTE — Telephone Encounter (Signed)
DOS: 01/24/23  posterior laminoplasty C3-C7 for myelopathy   He has been taking celebrex with no issues. Refill sent to pharmacy.   Please let him know.

## 2023-03-06 ENCOUNTER — Encounter: Payer: Self-pay | Admitting: Neurosurgery

## 2023-03-06 ENCOUNTER — Ambulatory Visit (INDEPENDENT_AMBULATORY_CARE_PROVIDER_SITE_OTHER): Payer: Self-pay | Admitting: Neurosurgery

## 2023-03-06 VITALS — BP 116/74 | Temp 97.8°F | Ht 69.0 in | Wt 163.0 lb

## 2023-03-06 DIAGNOSIS — Z09 Encounter for follow-up examination after completed treatment for conditions other than malignant neoplasm: Secondary | ICD-10-CM

## 2023-03-06 DIAGNOSIS — G959 Disease of spinal cord, unspecified: Secondary | ICD-10-CM

## 2023-03-06 NOTE — Progress Notes (Signed)
   REFERRING PHYSICIAN:  [name], Md [address] [place],  [place] [address]  DOS: 12/13/22  C3-7 laminoplasty  HISTORY OF PRESENT ILLNESS: Andrew Stuart is about 3 months status post cervical laminoplasty. Overall, he is doing fairly well. He has had some ongoing stiffness in his neck which is largely improved from pre-op. He does note about 4-6 weeks of pain in his left shoulder that comes and goes that is worse with overhead movement.  He attributes this to a history of previous shoulder injury.  He denies any pain that radiates down his arm.  He continues to have ongoing lumbosacral complaints which are previously documented and unchanged.  02/08/23 Stacy Doy Mince PA-C Tor Netters is 2 weeks status post above surgery. Given celebrex, robaxin, and oxycodone on discharge from the hospital.    Was admitted on 01/27/23 due to syncope.    He is not taking oxycodone or neurontin. He has only taken 1 robaxin since his surgery. He is taking celebrex. He has some expected posterior neck pain and tightness. He feels like he is improving. Walking is a little better.     PHYSICAL EXAMINATION:  NEUROLOGICAL:  General: In no acute distress.   Awake, alert, oriented to person, place, and time.  Pupils equal round and reactive to light.  Facial tone is symmetric.   Strength: Side Biceps Triceps Deltoid Interossei Grip Wrist Ext. Wrist Flex.  R 5 5 5 5 5 5 5   L 5 5 5 5 5 5 5    Incision c/d/I  Imaging:  No interval imaging to review  Assessment / Plan: Andrew Stuart is doing well after cervical laminoplasty. We discussed activity escalation and I have advised the patient to increase lifting limit up to 25 pounds as tolerated.  He was given a printout for approved neck exercises.  he will return to clinic in approximately 6 weeks with Dr. Myer Haff as scheduled.  We discussed that this is early for follow-up in regards to his cervical surgery however he initially saw Dr. Myer Haff for  lumbosacral complaints and would like to discuss surgical intervention at this follow-up.  Will keep the appointment as is.  Should he have worsening lumbar symptoms, Dr. Myer Haff is agreeable to see him sooner than his scheduled appointment.  I spent a total of 34 minutes in both face-to-face and non-face-to-face activities for this visit on the date of this encounter.   Manning Charity PA-C Dept of Neurosurgery

## 2023-03-08 ENCOUNTER — Encounter: Payer: Medicare HMO | Admitting: Neurosurgery

## 2023-03-12 ENCOUNTER — Ambulatory Visit (INDEPENDENT_AMBULATORY_CARE_PROVIDER_SITE_OTHER): Payer: Medicare HMO

## 2023-03-12 DIAGNOSIS — E538 Deficiency of other specified B group vitamins: Secondary | ICD-10-CM | POA: Diagnosis not present

## 2023-03-12 MED ORDER — CYANOCOBALAMIN 1000 MCG/ML IJ SOLN
1000.0000 ug | Freq: Once | INTRAMUSCULAR | Status: AC
Start: 2023-03-12 — End: 2023-03-12
  Administered 2023-03-12: 1000 ug via INTRAMUSCULAR

## 2023-03-12 NOTE — Progress Notes (Signed)
After obtaining consent, and per orders of Dr. Ronnald Ramp, injection of B12 given by Marrian Salvage. Patient instructed to report any adverse reaction to me immediately.

## 2023-03-20 ENCOUNTER — Other Ambulatory Visit: Payer: Self-pay | Admitting: Orthopedic Surgery

## 2023-03-20 DIAGNOSIS — G959 Disease of spinal cord, unspecified: Secondary | ICD-10-CM

## 2023-03-20 DIAGNOSIS — Z9889 Other specified postprocedural states: Secondary | ICD-10-CM

## 2023-03-20 NOTE — Telephone Encounter (Signed)
Okay for celebrex refill, but recommend going down to once a day since he is 3 months out from surgery.   I sent refill with new directions to his pharmacy. Please let him know.

## 2023-03-20 NOTE — Telephone Encounter (Signed)
Patient confirmed

## 2023-04-09 ENCOUNTER — Ambulatory Visit: Payer: Medicare HMO

## 2023-04-11 ENCOUNTER — Ambulatory Visit (INDEPENDENT_AMBULATORY_CARE_PROVIDER_SITE_OTHER): Payer: Medicare HMO

## 2023-04-11 DIAGNOSIS — E538 Deficiency of other specified B group vitamins: Secondary | ICD-10-CM

## 2023-04-11 MED ORDER — CYANOCOBALAMIN 1000 MCG/ML IJ SOLN
1000.0000 ug | Freq: Once | INTRAMUSCULAR | Status: AC
Start: 2023-04-11 — End: 2023-04-11
  Administered 2023-04-11: 1000 ug via INTRAMUSCULAR

## 2023-04-11 NOTE — Progress Notes (Signed)
Patient visits today to receive his B12 Injection. Patient was informed and tolerated well. Patient was notified to reach out to [place] if needed.

## 2023-04-18 ENCOUNTER — Telehealth: Payer: Self-pay | Admitting: Orthopedic Surgery

## 2023-04-18 DIAGNOSIS — G959 Disease of spinal cord, unspecified: Secondary | ICD-10-CM

## 2023-04-18 DIAGNOSIS — Z9889 Other specified postprocedural states: Secondary | ICD-10-CM

## 2023-04-18 IMAGING — MR MR PELVIS WO/W CM
11 series · 48 of 48 positions shown · IV contrast (MULTIHANCE)
Comparison: None.

CLINICAL DATA: Weight loss, anal pain

EXAM:
MRI PELVIS WITHOUT AND WITH CONTRAST
TECHNIQUE: Multiplanar multisequence MR imaging of the pelvis was performed
both before and after administration of intravenous contrast.
CONTRAST:  15mL MULTIHANCE GADOBENATE DIMEGLUMINE 529 MG/ML IV SOLN

[Series 2: T2 · coronal · 5.0mm · 1.56mm/px · 2 of 35 slices shown (1 of 3)]
[im 1/35]
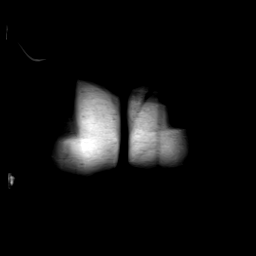
[im 35/35]
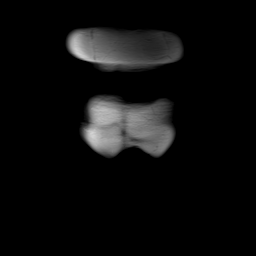

[Series 3: T2 · sagittal · 5.0mm · 1.02mm/px · 3 of 60 slices shown (2 of 3)]
[im 1/60]
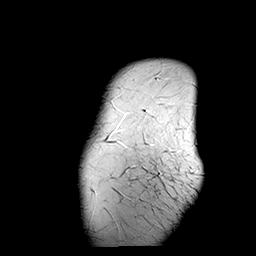
[im 30/60]
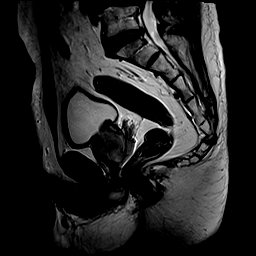
[im 60/60]
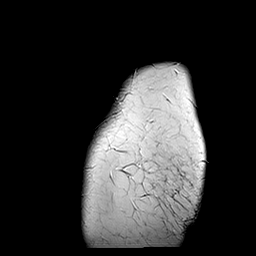

[Series 4: T2 · axial · 5.0mm · 0.55mm/px · 1 of 42 slices shown (3 of 3)]
[im 1/42]
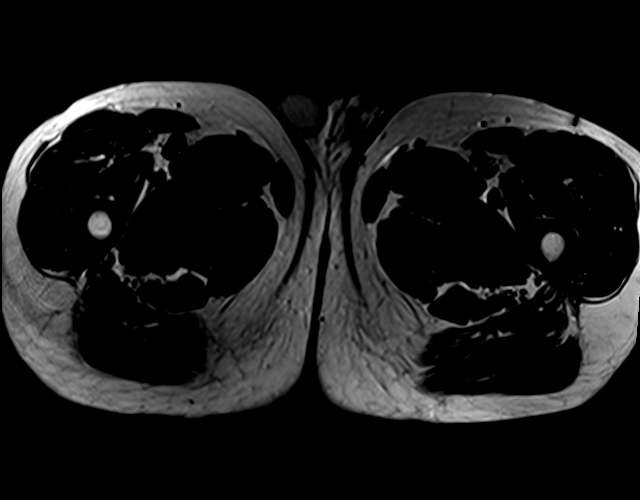

[Series 5: T2 fat-sat · axial · 5.0mm · 0.55mm/px · 1 of 42 slices shown]
[im 1/42]
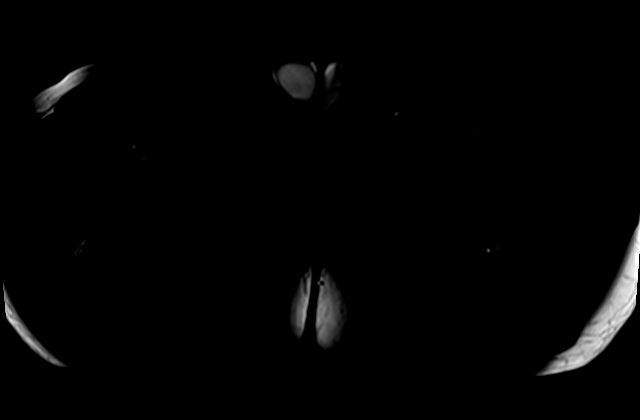

[Series 6: T1 · axial · 1.2mm · 1.09mm/px · z∈[-153,+114]mm · 6 of 224 slices shown]
[im 1/224]
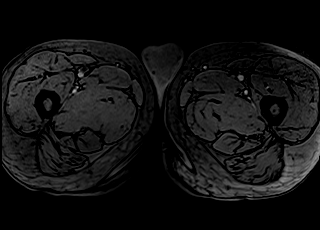
[im 45/224]
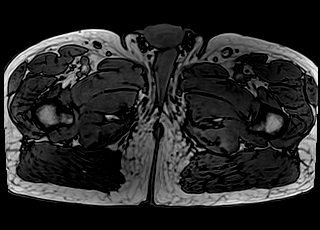
[im 90/224]
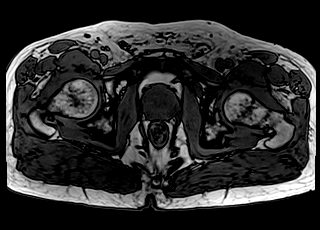
[im 134/224]
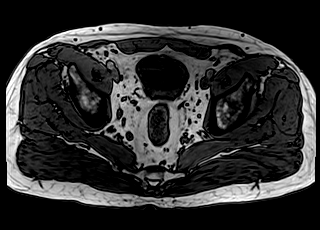
[im 179/224]
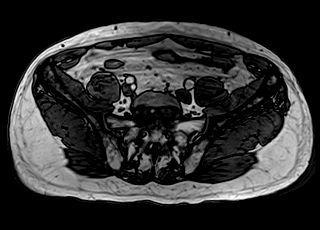
[im 224/224]
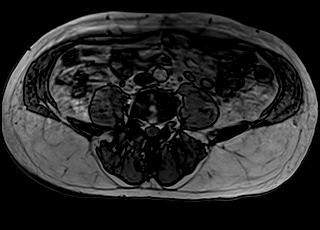

[Series 7: T1 fat-sat · axial · 1.2mm · 1.09mm/px · z∈[-153,+114]mm · 6 of 224 slices shown (1 of 2)]
[im 1/224]
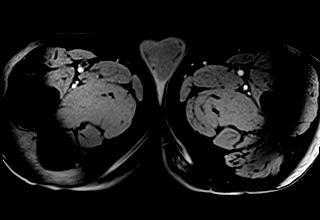
[im 45/224]
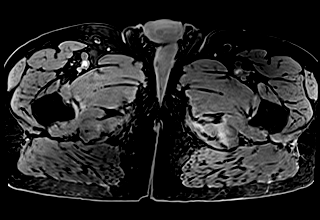
[im 90/224]
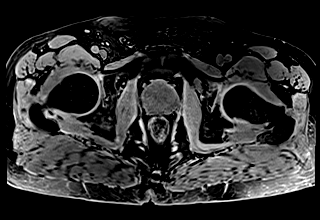
[im 134/224]
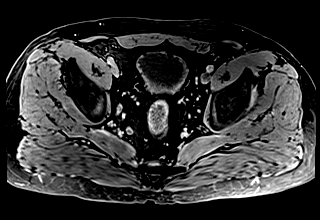
[im 179/224]
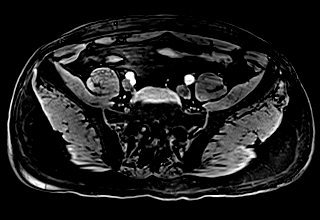
[im 224/224]
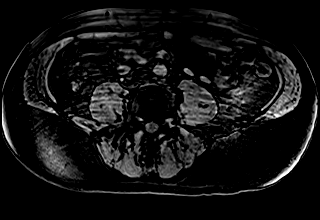

[Series 8: T1 fat-sat post-contrast · axial · 1.2mm · 1.09mm/px · z∈[-153,+114]mm · 6 of 224 slices shown (1 of 4)]
[im 1/224]
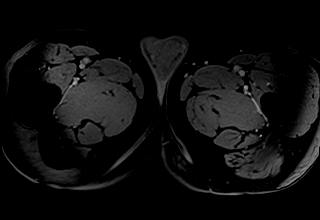
[im 45/224]
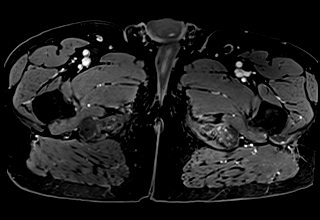
[im 90/224]
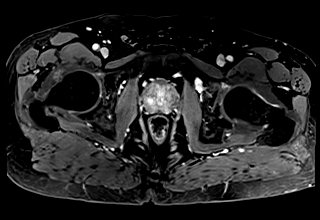
[im 134/224]
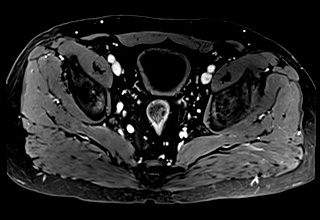
[im 179/224]
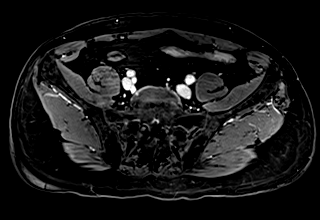
[im 224/224]
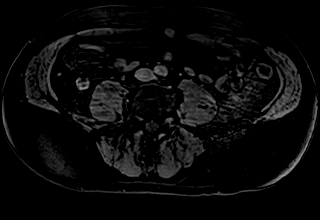

[Series 9: T1 fat-sat post-contrast · axial · 1.2mm · 1.09mm/px · z∈[-153,+114]mm · 6 of 224 slices shown (2 of 4)]
[im 1/224]
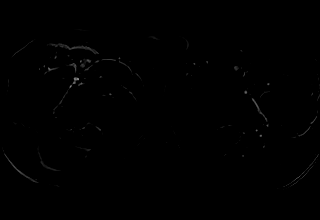
[im 45/224]
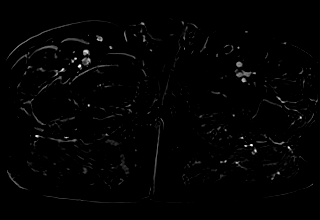
[im 90/224]
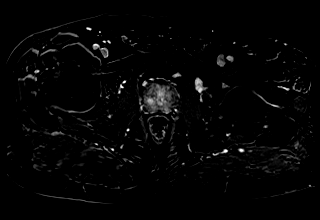
[im 134/224]
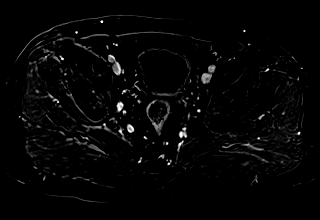
[im 179/224]
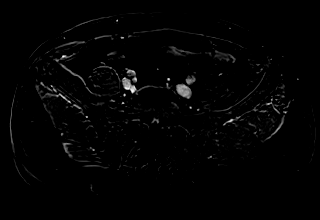
[im 224/224]
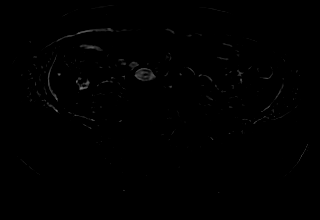

[Series 10: T1 fat-sat · coronal · 1.2mm · 1.25mm/px · 5 of 192 slices shown (2 of 2)]
[im 1/192]
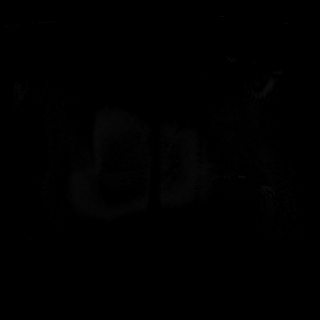
[im 48/192]
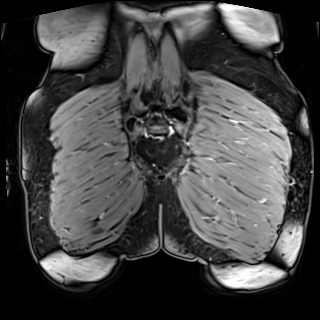
[im 96/192]
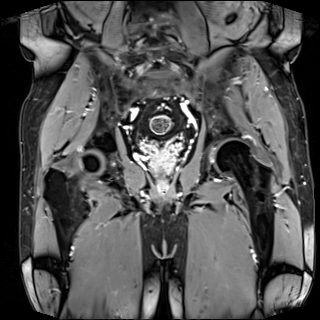
[im 144/192]
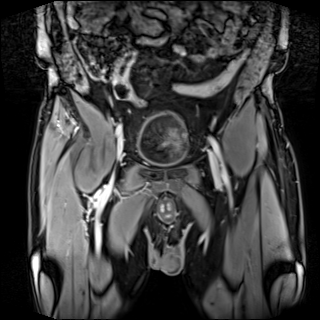
[im 192/192]
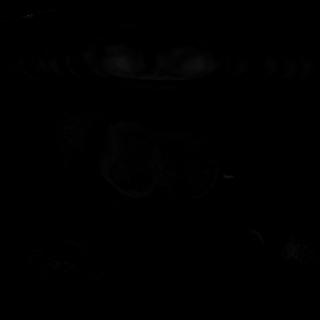

[Series 11: T1 fat-sat post-contrast · axial · 1.2mm · 1.09mm/px · z∈[-153,+114]mm · 6 of 224 slices shown (3 of 4)]
[im 1/224]
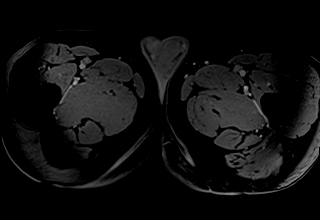
[im 45/224]
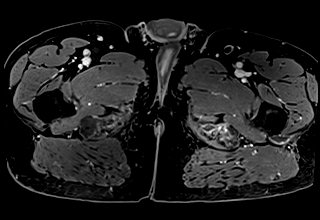
[im 90/224]
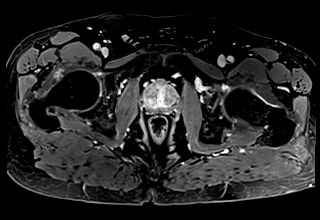
[im 134/224]
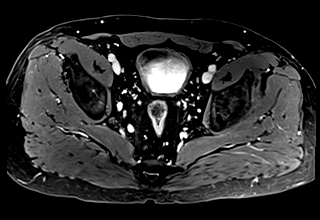
[im 179/224]
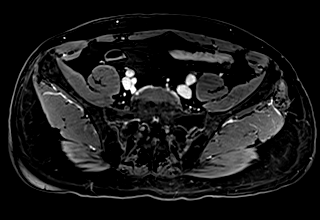
[im 224/224]
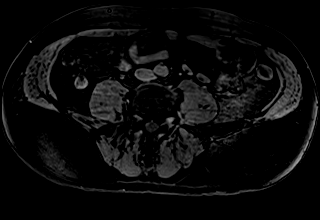

[Series 12: T1 fat-sat post-contrast · axial · 1.2mm · 1.09mm/px · z∈[-153,+114]mm · 6 of 224 slices shown (4 of 4)]
[im 1/224]
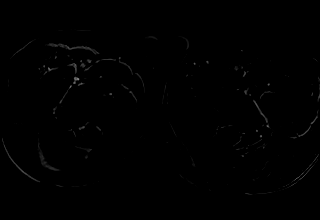
[im 45/224]
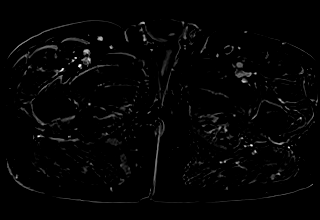
[im 90/224]
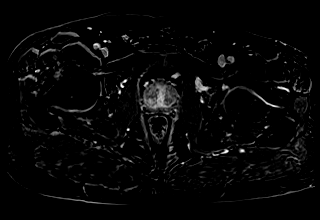
[im 134/224]
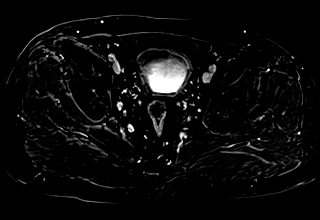
[im 179/224]
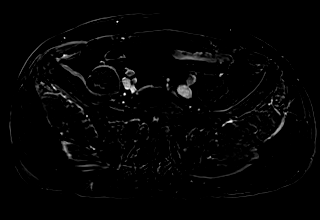
[im 224/224]
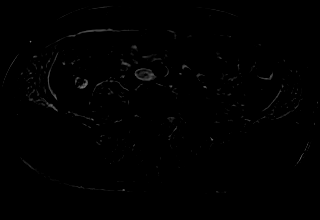

[48 of 48 positions shown; findings below may reference images not displayed]

FINDINGS: Urinary Tract: Urinary bladder appears within normal limits. No
suspicious mass identified.

Bowel: No evidence of bowel obstruction. Mild colonic
diverticulosis. No suspicious edema or enhancement in the visualized
bowel, rectum or anal region.

Vascular/Lymphatic: No aneurysms visualized. No bulky
lymphadenopathy.

Reproductive:  Prostate gland is enlarged.

Other:  No ascites.

Musculoskeletal: There is tendinosis of the bilateral proximal
hamstring tendons which is moderate to severe on the left with
partial tearing. No suspicious bony lesions identified.
IMPRESSION: 1. No acute process or suspicious mass identified in the pelvis.
2. Prostatomegaly.
3. Mild colonic diverticulosis.
4. Tendinosis of the bilateral proximal hamstring tendons which is
moderate to severe on the left with partial tearing.

## 2023-04-18 NOTE — Telephone Encounter (Signed)
Patient aware of medication refill.

## 2023-04-18 NOTE — Telephone Encounter (Signed)
Celebrex refill sent to his pharmacy. Please let him know.

## 2023-04-19 ENCOUNTER — Other Ambulatory Visit: Payer: Self-pay

## 2023-04-19 ENCOUNTER — Ambulatory Visit: Payer: Medicare HMO | Admitting: Neurosurgery

## 2023-04-19 VITALS — BP 128/70 | Ht 69.0 in | Wt 163.0 lb

## 2023-04-19 DIAGNOSIS — Z01818 Encounter for other preprocedural examination: Secondary | ICD-10-CM

## 2023-04-19 DIAGNOSIS — M48062 Spinal stenosis, lumbar region with neurogenic claudication: Secondary | ICD-10-CM | POA: Diagnosis not present

## 2023-04-19 NOTE — Patient Instructions (Addendum)
Please see below for information in regards to your upcoming surgery:   Planned surgery: L3-4 decompression   Surgery date: 05/16/23 at [facility]: [address], [place], [place] [address]) - you will find out your arrival time the business day before your surgery.   Pre-op appointment at [facility] Pre-admit Testing: we will call you with a date/time for this. If you are scheduled for an in person appointment, Pre-admit Testing is located on the first floor of the [facility] building, [address], [address]. Please bring all prescriptions in the original prescription bottles to your appointment. During this appointment, they will advise you which medications you can take the morning of surgery, and which medications you will need to hold for surgery. Labs (such as blood work, EKG) may be done at your pre-op appointment. You are not required to fast for these labs. Should you need to change your pre-op appointment, please call Pre-admit testing at [phone].      Surgical clearance: we will send a clearance form to Dr Sanda Linger. They may wish to see you in their office prior to signing the clearance form. If so, they may call you to schedule an appointment.    Common restrictions after surgery: No bending, lifting, or twisting ("BLT"). Avoid lifting objects heavier than 10 pounds for the first 6 weeks after surgery. Where possible, avoid household activities that involve lifting, bending, reaching, pushing, or pulling such as laundry, vacuuming, grocery shopping, and childcare. Try to arrange for help from friends and family for these activities while you heal. Do not drive while taking prescription pain medication. Weeks 6 through 12 after surgery: avoid lifting more than 25 pounds.     How to contact us:  If you have any questions/concerns before or after surgery, you can reach [place] at [phone], or you can send a mychart  message. We can be reached by phone or mychart 8am-4pm, Monday-Friday.  *Please note: Calls after 4pm are forwarded to a third party answering service. Mychart messages are not routinely monitored during evenings, weekends, and holidays. Please call our office to contact the answering service for urgent concerns during non-business hours.     Appointments/FMLA & disability paperwork: Joycelyn Rua, & Flonnie Hailstone Registered Nurse/Surgery scheduler: Royston Cowper Medical Assistants: Nash Mantis Physician Assistants: Joan Flores, PA-C, Manning Charity, PA-C & Drake Leach, PA-C Surgeons: Venetia Night, MD & Ernestine Mcmurray, MD

## 2023-04-19 NOTE — Progress Notes (Signed)
Referring Physician:  [name], MD [address] [place],  [place] [address]  Primary Physician:  [name], MD  DOS: 12/13/22  C3-7 laminoplasty   History of Present Illness: 04/19/2023 He is doing well from his laminoplasty.  He continues to have symptoms of neurogenic claudication.   11/09/2022 Mr. Essien Bahl is here today with a chief complaint of weakness in his right leg and some difficulty with walking.  He also gets pain and numbness into his legs prickly when he walks.  Activity makes it worse.  He sometimes has to sit down to help with his discomfort.  Heat and Aleve also help.  He has right lateral hip pain as well as posterior thigh pain.  He has bilateral calf cramping intermittently.  He gets numbness in his right lateral calf as well.  His legs feel tight in the morning but get better as he loosens up.  His husband reports that he is not as active as he previously was.  He is also noted that he walks bent forward compared to prior.   He reports arm numbness when he bends his head back.   Bowel/Bladder Dysfunction: none  Conservative measures:  Physical therapy:   has participated in from 08/23/22 to 10/18/22 for his balance and back Multimodal medical therapy including regular antiinflammatories: gabapentin, methocarbamol, naproxen  Injections: has not received epidural steroid injections  Past Surgery: denies  MADDIX LEBEN has some symptoms of cervical myelopathy.  The symptoms are causing a significant impact on the patient's life.   I have utilized the care everywhere function in epic to review the outside records available from external health systems.  Review of Systems:  A 10 point review of systems is negative, except for the pertinent positives and negatives detailed in the HPI.  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   Chronic venous insufficiency    Elevated blood pressure reading    Hepatitis    History of kidney stones    Hyperglycemia    Hyperlipidemia    Idiopathic progressive neuropathy    Right lumbar radiculitis    Spinal stenosis, lumbar region with neurogenic claudication    Varicose veins of both lower extremities    Vitamin B 12 deficiency     Past Surgical History: Past Surgical History:  Procedure Laterality Date   CARPAL TUNNEL RELEASE Right 2015   CARPAL TUNNEL RELEASE Left 2021   COLONOSCOPY     ENDOVENOUS ABLATION SAPHENOUS VEIN W/ LASER Left 04/12/2022   endovenous laser ablation left greater saphenous vein and stab phlebectomy < 10 incisions left leg by Cari Caraway MD   HEMORRHOID SURGERY     HERNIA REPAIR     left foot surgery     TONSILLECTOMY      Allergies: Allergies as of 04/19/2023 - Review Complete 04/19/2023  Allergen Reaction Noted   Bactrim [sulfamethoxazole-trimethoprim] Rash 03/30/2021   Azithromycin Swelling 01/04/2022   Bee pollen  04/28/2020   Blueberry flavoring agent (non-screening) Rash 04/28/2020   Strawberry extract Rash 04/28/2020    Medications:  Current Outpatient Medications:    acetaminophen (TYLENOL) 500 MG tablet, Take 500 mg by mouth every 6 (six) hours as needed  for moderate pain., Disp: , Rfl:    atorvastatin (LIPITOR) 20 MG tablet, TAKE 1 TABLET BY MOUTH EVERY DAY, Disp: 90 tablet, Rfl: 1   celecoxib (CELEBREX) 200 MG capsule, TAKE 1 CAPSULE BY MOUTH EVERY DAY, Disp: 30 capsule, Rfl: 1   MAGNESIUM PO, Take 250 mg by mouth daily., Disp: , Rfl:    methocarbamol (ROBAXIN) 500 MG tablet, Take 1 tablet (500 mg total) by mouth every 8 (eight) hours as needed for muscle spasms., Disp: 45 tablet, Rfl: 2   Naproxen Sodium (ALEVE PO), Take 220 tablets by mouth every 6 (six) hours as needed (Pain)., Disp: , Rfl:    psyllium (METAMUCIL)  58.6 % packet, Take 1 packet by mouth daily., Disp: , Rfl:    VITAMIN B1-B12 IJ, Inject 1 Dose as directed every 30 (thirty) days., Disp: , Rfl:   Social History: Social History   Tobacco Use   Smoking status: Never    Passive exposure: Never   Smokeless tobacco: Never  Vaping Use   Vaping status: Never Used  Substance Use Topics   Alcohol use: Yes    Alcohol/week: 7.0 standard drinks of alcohol    Types: 7 Glasses of wine per week    Comment: occ wine once a month   Drug use: Never    Family Medical History: Family History  Problem Relation Age of Onset   Arthritis Mother    Diabetes Mother    Early death Mother    Heart disease Mother    Hyperlipidemia Mother    Colon cancer Mother    Early death Father    Early death Sister    Colon cancer Sister    ALS Sister    Early death Brother    Lung cancer Brother    Liver cancer Brother     Physical Examination: Vitals:   04/19/23 1048  BP: 128/70    General: Patient is in no apparent distress. Attention to examination is appropriate.  Neck:   Supple.  Full range of motion.  Respiratory: Patient is breathing without any difficulty.   NEUROLOGICAL:     Awake, alert, oriented to person, place, and time.  Speech is clear and fluent.   Cranial Nerves: Pupils equal round and reactive to light.  Facial tone is symmetric.  Facial sensation is symmetric. Shoulder shrug is symmetric. Tongue protrusion is midline.  There is no pronator drift.  Strength: Side Biceps Triceps Deltoid Interossei Grip Wrist Ext. Wrist Flex.  R 5 5 5 5 5 5 5   L 5 5 5 5 5 5 5    Side Iliopsoas Quads Hamstring PF DF EHL  R 4+ 4+ 5 5 5 5   L 4+ 5 5 5 5 5    Reflexes are 1+ and symmetric at the biceps, triceps, brachioradialis, 3+patella and 2+achilles.   Hoffman's is absent.   Bilateral upper and lower extremity sensation is intact to light touch with exception of his legs which are diminished.    No evidence of dysmetria noted.  Gait is  wide-based.     Medical Decision Making  Imaging: MRI L spine 11/04/2022 Disc levels:   T12-L1: Small disc bulge without spinal canal stenosis   L1-L2: Small disc bulge. No spinal canal stenosis. No neural foraminal stenosis.   L2-L3: Small disc bulge with moderate facet hypertrophy. Mild spinal canal stenosis. No neural foraminal stenosis.   L3-L4: Large disc bulge with endplate spurring. Severe spinal canal stenosis. Mild right and moderate left neural foraminal stenosis.  L4-L5: Moderate facet hypertrophy and intermediate sized disc bulge with endplate spurring. Mild spinal canal stenosis. Moderate right and mild left neural foraminal stenosis.   L5-S1: Small disc bulge. No spinal canal stenosis. No neural foraminal stenosis.   Visualized sacrum: Normal.   IMPRESSION: 1. Severe spinal canal stenosis at L3-L4 with impingement of the cauda equina and moderate left neural foraminal stenosis. 2. Mild spinal canal stenosis at L2-L3 and L4-L5. 3. Moderate right L4-L5 neural foraminal stenosis.     Electronically Signed   By: Deatra Robinson M.D.   On: 11/04/2022 23:53  I have personally reviewed the images and agree with the above interpretation.  Assessment and Plan: Mr. Vires is a pleasant 72 y.o. 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    He has tried and failed conservative management.  He has recovered well from his laminoplasty.  I recommended that we move forward with L3-4 decompression.  I discussed the planned procedure at length with the patient, including the risks, benefits, alternatives, and indications. The risks discussed include but are not limited to bleeding, infection, need for reoperation, spinal fluid leak, stroke, vision loss, anesthetic complication, coma, paralysis, and even death. I also described in detail that improvement was not guaranteed.  The patient expressed understanding of these risks, and asked that we  proceed with surgery. I described the surgery in layman's terms, and gave ample opportunity for questions, which were answered to the best of my ability.  Before moving forward with the surgery, I would like to obtain MRI scan of the cervical and thoracic spine to ensure that he does not have substantial compression at 1 of these 2 levels that is causing his difficulty with walking and hyperreflexia.  Thank you for involving me in the care of this patient.      Nijah Tejera K. Myer Haff MD, [facility] Neurosurgery

## 2023-04-26 ENCOUNTER — Telehealth: Payer: Self-pay | Admitting: Internal Medicine

## 2023-04-26 NOTE — Telephone Encounter (Signed)
Surgical clearance has been signed and successfully faxed back.

## 2023-04-26 NOTE — Telephone Encounter (Signed)
Copied from CRM [id number]. Topic: General - Other >> Apr 26, 2023 11:59 AM Corin V wrote: Reason for CRM: Lauren with Neurosurgery at [facility] called to follow up on a Neurosurgery clearance form. It was faxed on 04/19/23. Patient is scheduled 05/16/23 for his surgery. If form is completed, please refax to [phone]. If form was never received, please call Lauren at [phone] to have her resend it.

## 2023-05-04 ENCOUNTER — Inpatient Hospital Stay: Admission: RE | Admit: 2023-05-04 | Payer: Medicare HMO | Source: Ambulatory Visit

## 2023-05-08 ENCOUNTER — Other Ambulatory Visit: Payer: Self-pay

## 2023-05-08 ENCOUNTER — Encounter
Admission: RE | Admit: 2023-05-08 | Discharge: 2023-05-08 | Disposition: A | Discharge status: Home / Self Care | Payer: Medicare HMO | Source: Ambulatory Visit | Attending: Neurosurgery | Admitting: Neurosurgery

## 2023-05-08 VITALS — BP 150/68 | HR 64 | Temp 97.7°F | Resp 20 | Ht 69.0 in | Wt 167.1 lb

## 2023-05-08 DIAGNOSIS — Z01812 Encounter for preprocedural laboratory examination: Secondary | ICD-10-CM | POA: Diagnosis present

## 2023-05-08 DIAGNOSIS — R9431 Abnormal electrocardiogram [ECG] [EKG]: Secondary | ICD-10-CM | POA: Diagnosis not present

## 2023-05-08 DIAGNOSIS — Z0001 Encounter for general adult medical examination with abnormal findings: Secondary | ICD-10-CM

## 2023-05-08 DIAGNOSIS — M48062 Spinal stenosis, lumbar region with neurogenic claudication: Secondary | ICD-10-CM | POA: Insufficient documentation

## 2023-05-08 HISTORY — DX: Other complications of anesthesia, initial encounter: T88.59XA

## 2023-05-08 HISTORY — DX: Spinal stenosis, lumbar region with neurogenic claudication: M48.062

## 2023-05-08 HISTORY — DX: Syncope and collapse: R55

## 2023-05-08 LAB — BASIC METABOLIC PANEL
Anion gap: 9 (ref 5–15)
BUN: 17 mg/dL (ref 8–23)
CO2: 27 mmol/L (ref 22–32)
Calcium: 9.3 mg/dL (ref 8.9–10.3)
Chloride: 104 mmol/L (ref 98–111)
Creatinine, Ser: 0.83 mg/dL (ref 0.61–1.24)
GFR, Estimated: >60 mL/min (ref >60)
Glucose, Bld: 103 mg/dL — ABNORMAL HIGH (ref 70–99)
Potassium: 4.2 mmol/L (ref 3.5–5.1)
Sodium: 140 mmol/L (ref 135–145)

## 2023-05-08 LAB — CBC
HCT: 39.1 % (ref 39.0–52.0)
Hemoglobin: 13.1 g/dL (ref 13.0–17.0)
MCH: 27.7 pg (ref 26.0–34.0)
MCHC: 33.5 g/dL (ref 30.0–36.0)
MCV: 82.7 fL (ref 80.0–100.0)
Platelets: 271 10*3/uL (ref 150–400)
RBC: 4.73 MIL/uL (ref 4.22–5.81)
RDW: 12.9 % (ref 11.5–15.5)
WBC: 7 10*3/uL (ref 4.0–10.5)
nRBC: 0 % (ref 0.0–0.2)

## 2023-05-08 LAB — TYPE AND SCREEN
ABO/RH(D): A NEG
Antibody Screen: NEGATIVE

## 2023-05-08 LAB — SURGICAL PCR SCREEN
MRSA, PCR: NEGATIVE
Staphylococcus aureus: NEGATIVE

## 2023-05-08 LAB — URINALYSIS, COMPLETE (UACMP) WITH MICROSCOPIC
Bacteria, UA: NONE SEEN
Bilirubin Urine: NEGATIVE
Glucose, UA: NEGATIVE mg/dL
Hgb urine dipstick: NEGATIVE
Ketones, ur: NEGATIVE mg/dL
Leukocytes,Ua: NEGATIVE
Nitrite: NEGATIVE
Protein, ur: NEGATIVE mg/dL
RBC / HPF: 0 RBC/hpf (ref 0–5)
Specific Gravity, Urine: 1.009 (ref 1.005–1.030)
Squamous Epithelial / HPF: 0 /[HPF] (ref 0–5)
pH: 6 (ref 5.0–8.0)

## 2023-05-08 NOTE — Patient Instructions (Addendum)
 Your procedure is scheduled on:Jan 15,2025 Wednesday Report to the Registration Desk on the 1st floor of the [facility]. To find out your arrival time, please call [phone] between 1PM - 3PM on: Tuesday, May 15, 2023 If your arrival time is 6:00 am, do not arrive before that time as the [facility] entrance doors do not open until 6:00 am.  REMEMBER: Instructions that are not followed completely may result in serious medical risk, up to and including death; or upon the discretion of your surgeon and anesthesiologist your surgery may need to be rescheduled.  Do not eat food after midnight the night before surgery.  No gum chewing or hard candies.  You may however, drink CLEAR liquids up to 2 hours before you are scheduled to arrive for your surgery. Do not drink anything within 2 hours of your scheduled arrival time.  Clear liquids include: - water   - apple juice without pulp - gatorade (not RED colors) - black coffee or tea (Do NOT add milk or creamers to the coffee or tea) Do NOT drink anything that is not on this list.  One week prior to surgery:  Stop ANY OVER THE COUNTER supplements until after surgery.  You may however, continue to take Tylenol  if needed for pain up until the day of surgery.     Continue taking all of your other prescription medications up until the day of surgery.  ON THE DAY OF SURGERY ONLY TAKE THESE MEDICATIONS WITH SIPS OF WATER :     No Alcohol for 24 hours before or after surgery.  No Smoking including e-cigarettes for 24 hours before surgery.  No chewable tobacco products for at least 6 hours before surgery.  No nicotine patches on the day of surgery.  Do not use any recreational drugs for at least a week (preferably 2 weeks) before your surgery.  Please be advised that the combination of cocaine and anesthesia may have negative outcomes, up to and including death. If you test positive for cocaine, your surgery will be  cancelled.  On the morning of surgery brush your teeth with toothpaste and water , you may rinse your mouth with mouthwash if you wish. Do not swallow any toothpaste or mouthwash.  Use CHG Soap or wipes as directed on instruction sheet.  Do not wear jewelry, make-up, hairpins, clips or nail polish.  For welded (permanent) jewelry: bracelets, anklets, waist bands, etc.  Please have this removed prior to surgery.  If it is not removed, there is a chance that hospital personnel will need to cut it off on the day of surgery.  Do not wear lotions, powders, or perfumes.   Do not shave body hair from the neck down 48 hours before surgery.  Contact lenses, hearing aids and dentures may not be worn into surgery.  Do not bring valuables to the hospital. [facility] is not responsible for any missing/lost belongings or valuables.   .Notify your doctor if there is any change in your medical condition (cold, fever, infection).  Wear comfortable clothing (specific to your surgery type) to the hospital.  After surgery, you can help prevent lung complications by doing breathing exercises.  Take deep breaths and cough every 1-2 hours. Your doctor may order a device called an Incentive Spirometer to help you take deep breaths.  If you are being admitted to the hospital overnight, leave your suitcase in the car. After surgery it may be brought to your room.   If you are being discharged  the day of surgery, you will not be allowed to drive home. You will need a responsible individual to drive you home and stay with you for 24 hours after surgery.    Please call the Pre-admissions Testing Dept. at [phone] if you have any questions about these instructions.  Surgery Visitation Policy:  Patients having surgery or a procedure may have two visitors.  Children under the age of 29 must have an adult with them who is not the patient.     Pre-operative 5 CHG Bath Instructions   You can play a  key role in reducing the risk of infection after surgery. Your skin needs to be as free of germs as possible. You can reduce the number of germs on your skin by washing with CHG (chlorhexidine  gluconate) soap before surgery. CHG is an antiseptic soap that kills germs and continues to kill germs even after washing.   DO NOT use if you have an allergy to chlorhexidine /CHG or antibacterial soaps. If your skin becomes reddened or irritated, stop using the CHG and notify one of our RNs at [phone].   Please shower with the CHG soap starting 4 days before surgery using the following schedule:     Please keep in mind the following:  DO NOT shave, including legs and underarms, starting the day of your first shower.   You may shave your face at any point before/day of surgery.  Place clean sheets on your bed the day you start using CHG soap. Use a clean washcloth (not used since being washed) for each shower. DO NOT sleep with pets once you start using the CHG.   CHG Shower Instructions:  If you choose to wash your hair and private area, wash first with your normal shampoo/soap.  After you use shampoo/soap, rinse your hair and body thoroughly to remove shampoo/soap residue.  Turn the water  OFF and apply about 3 tablespoons (45 ml) of CHG soap to a CLEAN washcloth.  Apply CHG soap ONLY FROM YOUR NECK DOWN TO YOUR TOES (washing for 3-5 minutes)  DO NOT use CHG soap on face, private areas, open wounds, or sores.  Pay special attention to the area where your surgery is being performed.  If you are having back surgery, having someone wash your back for you may be helpful. Wait 2 minutes after CHG soap is applied, then you may rinse off the CHG soap.  Pat dry with a clean towel  Put on clean clothes/pajamas   If you choose to wear lotion, please use ONLY the CHG-compatible lotions on the back of this paper.     Additional instructions for the day of surgery: DO NOT APPLY any lotions, deodorants,  cologne, or perfumes.   Put on clean/comfortable clothes.  Brush your teeth.  Ask your nurse before applying any prescription medications to the skin.      CHG Compatible Lotions   Aveeno Moisturizing lotion  Cetaphil Moisturizing Cream  Cetaphil Moisturizing Lotion  Clairol Herbal Essence Moisturizing Lotion, Dry Skin  Clairol Herbal Essence Moisturizing Lotion, Extra Dry Skin  Clairol Herbal Essence Moisturizing Lotion, Normal Skin  Curel Age Defying Therapeutic Moisturizing Lotion with Alpha Hydroxy  Curel Extreme Care Body Lotion  Curel Soothing Hands Moisturizing Hand Lotion  Curel Therapeutic Moisturizing Cream, Fragrance-Free  Curel Therapeutic Moisturizing Lotion, Fragrance-Free  Curel Therapeutic Moisturizing Lotion, Original Formula  Eucerin Daily Replenishing Lotion  Eucerin Dry Skin Therapy Plus Alpha Hydroxy Crme  Eucerin Dry Skin Therapy Plus Alpha Hydroxy Lotion  Eucerin Original Crme  Eucerin Original Lotion  Eucerin Plus Crme Eucerin Plus Lotion  Eucerin TriLipid Replenishing Lotion  Keri Anti-Bacterial Hand Lotion  Keri Deep Conditioning Original Lotion Dry Skin Formula Softly Scented  Keri Deep Conditioning Original Lotion, Fragrance Free Sensitive Skin Formula  Keri Lotion Fast Absorbing Fragrance Free Sensitive Skin Formula  Keri Lotion Fast Absorbing Softly Scented Dry Skin Formula  Keri Original Lotion  Keri Skin Renewal Lotion Keri Silky Smooth Lotion  Keri Silky Smooth Sensitive Skin Lotion  Nivea Body Creamy Conditioning Oil  Nivea Body Extra Enriched Teacher, Adult Education Moisturizing Lotion Nivea Crme  Nivea Skin Firming Lotion  NutraDerm 30 Skin Lotion  NutraDerm Skin Lotion  NutraDerm Therapeutic Skin Cream  NutraDerm Therapeutic Skin Lotion  ProShield Protective Hand Cream  Provon moisturizing lotion

## 2023-05-14 ENCOUNTER — Ambulatory Visit (INDEPENDENT_AMBULATORY_CARE_PROVIDER_SITE_OTHER): Payer: Medicare HMO

## 2023-05-14 DIAGNOSIS — E538 Deficiency of other specified B group vitamins: Secondary | ICD-10-CM

## 2023-05-14 MED ORDER — CYANOCOBALAMIN 1000 MCG/ML IJ SOLN
1000.0000 ug | Freq: Once | INTRAMUSCULAR | Status: AC
  Administered 2023-05-14: 1000 ug via INTRAMUSCULAR

## 2023-05-14 NOTE — Progress Notes (Signed)
 After obtaining consent, and per orders of Dr. Yetta Barre, injection of B12 given by Ferdie Ping. Patient instructed to report any adverse reaction to me immediately.

## 2023-05-15 MED ORDER — CHLORHEXIDINE GLUCONATE 0.12 % MT SOLN
15.0000 mL | Freq: Once | OROMUCOSAL | Status: AC
  Administered 2023-05-16: 15 mL via OROMUCOSAL

## 2023-05-15 MED ORDER — LACTATED RINGERS IV SOLN: INTRAVENOUS | Status: DC

## 2023-05-15 MED ORDER — CEFAZOLIN IN SODIUM CHLORIDE 2-0.9 GM/100ML-% IV SOLN
2.0000 g | Freq: Once | INTRAVENOUS | Status: DC
Start: 2023-05-15 — End: 2023-05-16
  Filled 2023-05-15: qty 100

## 2023-05-15 MED ORDER — ORAL CARE MOUTH RINSE: 15.0000 mL | Freq: Once | OROMUCOSAL | Status: AC

## 2023-05-16 ENCOUNTER — Ambulatory Visit: Payer: Medicare HMO

## 2023-05-16 ENCOUNTER — Encounter
Admission: RE | Disposition: A | Discharge status: Home / Self Care | Payer: Self-pay | Source: Ambulatory Visit | Attending: Neurosurgery

## 2023-05-16 ENCOUNTER — Other Ambulatory Visit: Payer: Self-pay

## 2023-05-16 ENCOUNTER — Ambulatory Visit: Payer: Medicare HMO | Admitting: Urgent Care

## 2023-05-16 ENCOUNTER — Ambulatory Visit
Admission: RE | Admit: 2023-05-16 | Discharge: 2023-05-16 | Disposition: A | Discharge status: Home / Self Care | Payer: Medicare HMO | Source: Ambulatory Visit | Attending: Neurosurgery | Admitting: Neurosurgery

## 2023-05-16 ENCOUNTER — Ambulatory Visit: Payer: Self-pay

## 2023-05-16 ENCOUNTER — Encounter: Payer: Self-pay | Admitting: Neurosurgery

## 2023-05-16 DIAGNOSIS — T884XXA Failed or difficult intubation, initial encounter: Secondary | ICD-10-CM

## 2023-05-16 DIAGNOSIS — Z01818 Encounter for other preprocedural examination: Secondary | ICD-10-CM

## 2023-05-16 DIAGNOSIS — M48062 Spinal stenosis, lumbar region with neurogenic claudication: Secondary | ICD-10-CM

## 2023-05-16 HISTORY — PX: LUMBAR LAMINECTOMY/DECOMPRESSION MICRODISCECTOMY: SHX5026

## 2023-05-16 HISTORY — DX: Failed or difficult intubation, initial encounter: T88.4XXA

## 2023-05-16 SURGERY — LUMBAR LAMINECTOMY/DECOMPRESSION MICRODISCECTOMY 1 LEVEL
Anesthesia: General

## 2023-05-16 MED ORDER — 0.9 % SODIUM CHLORIDE (POUR BTL) OPTIME
TOPICAL | Status: DC | PRN
  Administered 2023-05-16: 500 mL

## 2023-05-16 MED ORDER — METHOCARBAMOL 500 MG PO TABS: 500.0000 mg | ORAL_TABLET | Freq: Three times a day (TID) | ORAL | 0 refills | Status: AC | PRN

## 2023-05-16 MED ORDER — KETOROLAC TROMETHAMINE 30 MG/ML IJ SOLN
INTRAMUSCULAR | Status: AC
  Filled 2023-05-16: qty 1

## 2023-05-16 MED ORDER — ACETAMINOPHEN 500 MG PO TABS
1000.0000 mg | ORAL_TABLET | Freq: Once | ORAL | Status: AC
  Administered 2023-05-16: 1000 mg via ORAL

## 2023-05-16 MED ORDER — FENTANYL CITRATE (PF) 250 MCG/5ML IJ SOLN
INTRAMUSCULAR | Status: AC
  Filled 2023-05-16: qty 5

## 2023-05-16 MED ORDER — LACTATED RINGERS IV SOLN: INTRAVENOUS | Status: DC | PRN

## 2023-05-16 MED ORDER — DEXMEDETOMIDINE HCL IN NACL 80 MCG/20ML IV SOLN
INTRAVENOUS | Status: DC | PRN
  Administered 2023-05-16: .2 ug/kg/h via INTRAVENOUS

## 2023-05-16 MED ORDER — SUCCINYLCHOLINE CHLORIDE 200 MG/10ML IV SOSY
PREFILLED_SYRINGE | INTRAVENOUS | Status: DC | PRN
  Administered 2023-05-16: 100 mg via INTRAVENOUS

## 2023-05-16 MED ORDER — DEXAMETHASONE SODIUM PHOSPHATE 10 MG/ML IJ SOLN
INTRAMUSCULAR | Status: AC
  Filled 2023-05-16: qty 1

## 2023-05-16 MED ORDER — SODIUM CHLORIDE (PF) 0.9 % IJ SOLN: INTRAMUSCULAR | Status: DC | PRN

## 2023-05-16 MED ORDER — PHENYLEPHRINE 80 MCG/ML (10ML) SYRINGE FOR IV PUSH (FOR BLOOD PRESSURE SUPPORT)
PREFILLED_SYRINGE | INTRAVENOUS | Status: DC | PRN
  Administered 2023-05-16 (×3): 80 ug via INTRAVENOUS

## 2023-05-16 MED ORDER — SODIUM CHLORIDE (PF) 0.9 % IJ SOLN
INTRAMUSCULAR | Status: DC | PRN
  Administered 2023-05-16: 60 mL

## 2023-05-16 MED ORDER — KETAMINE HCL 50 MG/5ML IJ SOSY
PREFILLED_SYRINGE | INTRAMUSCULAR | Status: AC
  Filled 2023-05-16: qty 5

## 2023-05-16 MED ORDER — MIDAZOLAM HCL 2 MG/2ML IJ SOLN
INTRAMUSCULAR | Status: AC
  Filled 2023-05-16: qty 2

## 2023-05-16 MED ORDER — LIDOCAINE HCL (CARDIAC) PF 100 MG/5ML IV SOSY
PREFILLED_SYRINGE | INTRAVENOUS | Status: DC | PRN
  Administered 2023-05-16: 100 mg via INTRAVENOUS

## 2023-05-16 MED ORDER — DEXAMETHASONE SODIUM PHOSPHATE 10 MG/ML IJ SOLN
INTRAMUSCULAR | Status: DC | PRN
  Administered 2023-05-16: 10 mg via INTRAVENOUS

## 2023-05-16 MED ORDER — CEFAZOLIN SODIUM-DEXTROSE 2-4 GM/100ML-% IV SOLN
INTRAVENOUS | Status: AC
  Filled 2023-05-16: qty 100

## 2023-05-16 MED ORDER — EPHEDRINE 5 MG/ML INJ
INTRAVENOUS | Status: AC
  Filled 2023-05-16: qty 5

## 2023-05-16 MED ORDER — PHENYLEPHRINE 80 MCG/ML (10ML) SYRINGE FOR IV PUSH (FOR BLOOD PRESSURE SUPPORT): PREFILLED_SYRINGE | INTRAVENOUS | Status: DC | PRN

## 2023-05-16 MED ORDER — MIDAZOLAM HCL 2 MG/2ML IJ SOLN
INTRAMUSCULAR | Status: DC | PRN
  Administered 2023-05-16: 2 mg via INTRAVENOUS

## 2023-05-16 MED ORDER — ONDANSETRON HCL 4 MG/2ML IJ SOLN
INTRAMUSCULAR | Status: AC
  Filled 2023-05-16: qty 2

## 2023-05-16 MED ORDER — ONDANSETRON HCL 4 MG/2ML IJ SOLN
INTRAMUSCULAR | Status: DC | PRN
  Administered 2023-05-16: 4 mg via INTRAVENOUS

## 2023-05-16 MED ORDER — LIDOCAINE HCL (PF) 2 % IJ SOLN
INTRAMUSCULAR | Status: AC
  Filled 2023-05-16: qty 5

## 2023-05-16 MED ORDER — SUCCINYLCHOLINE CHLORIDE 200 MG/10ML IV SOSY
PREFILLED_SYRINGE | INTRAVENOUS | Status: AC
  Filled 2023-05-16: qty 10

## 2023-05-16 MED ORDER — PROPOFOL 10 MG/ML IV BOLUS
INTRAVENOUS | Status: DC | PRN
  Administered 2023-05-16: 100 ug/kg/min via INTRAVENOUS
  Administered 2023-05-16: 200 mg via INTRAVENOUS

## 2023-05-16 MED ORDER — SENNA 8.6 MG PO TABS: 1.0000 | ORAL_TABLET | Freq: Two times a day (BID) | ORAL | 0 refills | Status: DC | PRN

## 2023-05-16 MED ORDER — KETAMINE HCL 50 MG/5ML IJ SOSY
PREFILLED_SYRINGE | INTRAMUSCULAR | Status: DC | PRN
  Administered 2023-05-16: 20 mg via INTRAVENOUS

## 2023-05-16 MED ORDER — BUPIVACAINE-EPINEPHRINE (PF) 0.5% -1:200000 IJ SOLN
INTRAMUSCULAR | Status: DC | PRN
  Administered 2023-05-16: 10 mL

## 2023-05-16 MED ORDER — KETOROLAC TROMETHAMINE 30 MG/ML IJ SOLN
INTRAMUSCULAR | Status: DC | PRN
  Administered 2023-05-16: 30 mg via INTRAVENOUS

## 2023-05-16 MED ORDER — FENTANYL CITRATE (PF) 100 MCG/2ML IJ SOLN
INTRAMUSCULAR | Status: DC | PRN
  Administered 2023-05-16: 50 ug via INTRAVENOUS
  Administered 2023-05-16: 100 ug via INTRAVENOUS

## 2023-05-16 MED ORDER — GABAPENTIN 300 MG PO CAPS
ORAL_CAPSULE | ORAL | Status: AC
  Filled 2023-05-16: qty 1

## 2023-05-16 MED ORDER — GABAPENTIN 300 MG PO CAPS
300.0000 mg | ORAL_CAPSULE | Freq: Once | ORAL | Status: AC
  Administered 2023-05-16: 300 mg via ORAL

## 2023-05-16 MED ORDER — EPHEDRINE SULFATE-NACL 50-0.9 MG/10ML-% IV SOSY
PREFILLED_SYRINGE | INTRAVENOUS | Status: DC | PRN
  Administered 2023-05-16: 10 mg via INTRAVENOUS
  Administered 2023-05-16 (×2): 5 mg via INTRAVENOUS

## 2023-05-16 MED ORDER — PROPOFOL 1000 MG/100ML IV EMUL
INTRAVENOUS | Status: AC
  Filled 2023-05-16: qty 100

## 2023-05-16 MED ORDER — SURGIFLO WITH THROMBIN (HEMOSTATIC MATRIX KIT) OPTIME
TOPICAL | Status: DC | PRN
  Administered 2023-05-16: 1 via TOPICAL

## 2023-05-16 MED ORDER — ACETAMINOPHEN 500 MG PO TABS
ORAL_TABLET | ORAL | Status: AC
  Filled 2023-05-16: qty 2

## 2023-05-16 MED ORDER — BUPIVACAINE HCL (PF) 0.5 % IJ SOLN: INTRAMUSCULAR | Status: DC | PRN

## 2023-05-16 MED ORDER — OXYCODONE HCL 5 MG PO TABS: 5.0000 mg | ORAL_TABLET | ORAL | 0 refills | Status: DC | PRN

## 2023-05-16 MED ORDER — METHYLPREDNISOLONE ACETATE 40 MG/ML IJ SUSP
INTRAMUSCULAR | Status: DC | PRN
  Administered 2023-05-16: 40 mg

## 2023-05-16 MED ORDER — PHENYLEPHRINE 80 MCG/ML (10ML) SYRINGE FOR IV PUSH (FOR BLOOD PRESSURE SUPPORT)
PREFILLED_SYRINGE | INTRAVENOUS | Status: AC
  Filled 2023-05-16: qty 20

## 2023-05-16 MED ORDER — CEFAZOLIN SODIUM-DEXTROSE 2-4 GM/100ML-% IV SOLN
2.0000 g | INTRAVENOUS | Status: AC
Start: 2023-05-16 — End: 2023-05-16
  Administered 2023-05-16: 2 g via INTRAVENOUS

## 2023-05-16 MED ORDER — CHLORHEXIDINE GLUCONATE 0.12 % MT SOLN
OROMUCOSAL | Status: AC
  Filled 2023-05-16: qty 15

## 2023-05-16 SURGICAL SUPPLY — 32 items
BASIN KIT SINGLE STR (MISCELLANEOUS) ×1 IMPLANT
BUR NEURO DRILL SOFT 3.0X3.8M (BURR) ×1 IMPLANT
DERMABOND ADVANCED .7 DNX12 (GAUZE/BANDAGES/DRESSINGS) ×1 IMPLANT
DRAPE C ARM PK CFD 31 SPINE (DRAPES) ×1 IMPLANT
DRAPE LAPAROTOMY 100X77 ABD (DRAPES) ×1 IMPLANT
DRAPE MICROSCOPE SPINE 48X150 (DRAPES) ×1 IMPLANT
DRSG OPSITE POSTOP 3X4 (GAUZE/BANDAGES/DRESSINGS) ×1 IMPLANT
ELECT EZSTD 165MM 6.5IN (MISCELLANEOUS) ×1
ELECT REM PT RETURN 9FT ADLT (ELECTROSURGICAL) ×1
ELECTRODE EZSTD 165MM 6.5IN (MISCELLANEOUS) ×1 IMPLANT
ELECTRODE REM PT RTRN 9FT ADLT (ELECTROSURGICAL) ×1 IMPLANT
GLOVE BIOGEL PI IND STRL 6.5 (GLOVE) ×1 IMPLANT
GLOVE SURG SYN 6.5 ES PF (GLOVE) ×1
GLOVE SURG SYN 6.5 PF PI (GLOVE) ×1 IMPLANT
GLOVE SURG SYN 8.5 E (GLOVE) ×3
GLOVE SURG SYN 8.5 PF PI (GLOVE) ×3 IMPLANT
GOWN SRG LRG LVL 4 IMPRV REINF (GOWNS) ×1 IMPLANT
GOWN SRG XL LVL 3 NONREINFORCE (GOWNS) ×1 IMPLANT
KIT SPINAL PRONEVIEW (KITS) ×1 IMPLANT
MANIFOLD NEPTUNE II (INSTRUMENTS) ×1 IMPLANT
MARKER SKIN DUAL TIP RULER LAB (MISCELLANEOUS) ×1 IMPLANT
NDL SAFETY ECLIPSE 18X1.5 (NEEDLE) ×1 IMPLANT
NS IRRIG 500ML POUR BTL (IV SOLUTION) ×1 IMPLANT
PACK LAMINECTOMY ARMC (PACKS) ×1 IMPLANT
SURGIFLO W/THROMBIN 8M KIT (HEMOSTASIS) ×1 IMPLANT
SUT STRATA 3-0 15 PS-2 (SUTURE) ×1 IMPLANT
SUT VIC AB 0 CT1 27XCR 8 STRN (SUTURE) ×1 IMPLANT
SUT VIC AB 2-0 CT1 18 (SUTURE) ×1 IMPLANT
SYR 30ML LL (SYRINGE) ×2 IMPLANT
SYR 3ML LL SCALE MARK (SYRINGE) ×1 IMPLANT
TRAP FLUID SMOKE EVACUATOR (MISCELLANEOUS) ×1 IMPLANT
WATER STERILE IRR 500ML POUR (IV SOLUTION) IMPLANT

## 2023-05-16 NOTE — Anesthesia Preprocedure Evaluation (Addendum)
 Anesthesia Evaluation  Patient identified by MRN, date of birth, ID band Patient awake    Reviewed: Allergy & Precautions, H&P , NPO status , Patient's Chart, lab work & pertinent test results, reviewed documented beta blocker date and time   History of Anesthesia Complications Negative for: history of anesthetic complications  Airway Mallampati: II  TM Distance: >3 FB Neck ROM: full    Dental  (+) Dental Advidsory Given, Caps, Missing   Pulmonary neg pulmonary ROS   Pulmonary exam normal breath sounds clear to auscultation       Cardiovascular Exercise Tolerance: Good negative cardio ROS Normal cardiovascular exam Rhythm:regular Rate:Normal     Neuro/Psych neg Seizures Neurogenic claudication due to lumbar spinal stenosis  Neuromuscular disease  negative psych ROS   GI/Hepatic negative GI ROS, Neg liver ROS,,,  Endo/Other  negative endocrine ROS    Renal/GU Renal disease: kidney stone.  negative genitourinary   Musculoskeletal   Abdominal Normal abdominal exam  (+)   Peds  Hematology negative hematology ROS (+)   Anesthesia Other Findings Past Medical History: No date: Abnormal EKG No date: Allergy No date: Arthritis No date: BPH (benign prostatic hyperplasia) No date: Carpal tunnel syndrome, left No date: Cauda equina compression (HCC) No date: Cervical stenosis of spine     Comment:  severe C3-4 No date: Chronic venous insufficiency No date: Elevated blood pressure reading No date: Hepatitis No date: History of kidney stones No date: Hyperglycemia No date: Hyperlipidemia No date: Idiopathic progressive neuropathy No date: Right lumbar radiculitis No date: Spinal stenosis, lumbar region with neurogenic claudication No date: Varicose veins of both lower extremities No date: Vitamin B 12 deficiency   Reproductive/Obstetrics negative OB ROS                              Anesthesia Physical Anesthesia Plan  ASA: 2  Anesthesia Plan: General   Post-op Pain Management: Tylenol  PO (pre-op)*, Gabapentin  PO (pre-op)*, Regional block* and Toradol  IV (intra-op)*   Induction: Intravenous  PONV Risk Score and Plan: 2 and Ondansetron , Dexamethasone  and Treatment may vary due to age or medical condition  Airway Management Planned: Oral ETT  Additional Equipment:   Intra-op Plan:   Post-operative Plan: Extubation in OR  Informed Consent: I have reviewed the patients History and Physical, chart, labs and discussed the procedure including the risks, benefits and alternatives for the proposed anesthesia with the patient or authorized representative who has indicated his/her understanding and acceptance.     Dental Advisory Given  Plan Discussed with: Anesthesiologist, CRNA and Surgeon  Anesthesia Plan Comments:        Anesthesia Quick Evaluation

## 2023-05-16 NOTE — Discharge Summary (Signed)
Discharge Summary  Patient ID: DAMASCUS BLOT MRN: [id number] DOB/AGE: Jul 02, 1950 73 y.o.  Admit date: 05/16/2023 Discharge date: 05/16/2023  Admission Diagnoses: Lumbar Stenosis with neurogenic claudication  Discharge Diagnoses:  Active Problems:   * No active hospital problems. *   Discharged Condition: good  Hospital Course:  Andrew Stuart is a 73 year old presenting with symptomatic lumbar stenosis status post L3-4 decompression.  His intraoperative course was uncomplicated.  He was recovered in PACU and discharged home after ambulating, urinating, and tolerating p.o. intake.  He was given prescriptions for pain medication, muscle relaxer, and stool softener.  Consults: None  Significant Diagnostic Studies: NA  Treatments: surgery: As above.  Please see separately dictated operative report for further details.  Discharge Exam: Blood pressure (!) 159/92, pulse 83, temperature (!) 97.2 F (36.2 C), temperature source Temporal, resp. rate 16, height 5\' 9"  (1.753 m), weight 74.8 kg, SpO2 100%. A&O MAEW Incision c/d/I and covered with clean postoperative dressing  Disposition: Discharge disposition: 01-Home or Self Care        Allergies as of 05/16/2023       Reactions   Bactrim [sulfamethoxazole-trimethoprim] Rash   Azithromycin Swelling   Bee Pollen    Blueberry Flavoring Agent (non-screening) Rash   Can have, but not an abundance.   Strawberry Extract Rash   Can have, but not an abundance.         Medication List     TAKE these medications    atorvastatin 20 MG tablet Commonly known as: LIPITOR TAKE 1 TABLET BY MOUTH EVERY DAY What changed: when to take this   celecoxib 200 MG capsule Commonly known as: CELEBREX TAKE 1 CAPSULE BY MOUTH EVERY DAY What changed:  how much to take when to take this reasons to take this   cyanocobalamin 1000 MCG/ML injection Commonly known as: VITAMIN B12 Inject 1,000 mcg into the muscle every 30 (thirty) days.    Magnesium 250 MG Tabs Take 250 mg by mouth in the morning.   methocarbamol 500 MG tablet Commonly known as: ROBAXIN Take 1 tablet (500 mg total) by mouth every 8 (eight) hours as needed for muscle spasms.   multivitamin with minerals Tabs tablet Take 1 tablet by mouth in the morning. MultiVites Gummy   oxyCODONE 5 MG immediate release tablet Commonly known as: Roxicodone Take 1 tablet (5 mg total) by mouth every 4 (four) hours as needed for severe pain (pain score 7-10).   senna 8.6 MG Tabs tablet Commonly known as: SENOKOT Take 1 tablet (8.6 mg total) by mouth 2 (two) times daily as needed for mild constipation.        Follow-up Information     Susanne Borders, PA Follow up on 05/29/2023.   Specialty: Neurosurgery Contact information: [address] [place] [address] [phone]                 Signed: Susanne Borders 05/16/2023, 2:05 PM

## 2023-05-16 NOTE — Interval H&P Note (Signed)
 History and Physical Interval Note:  05/16/2023 11:57 AM  Andrew Stuart  has presented today for surgery, with the diagnosis of M48.062 Neurogenic claudication due to lumbar spinal stenosis.  The various methods of treatment have been discussed with the patient and family. After consideration of risks, benefits and other options for treatment, the patient has consented to  Procedure(s): L3-4 DECOMPRESSION (N/A) as a surgical intervention.  The patient's history has been reviewed, patient examined, no change in status, stable for surgery.  I have reviewed the patient's chart and labs.  Questions were answered to the patient's satisfaction.    Heart sounds normal no MRG. Chest Clear to Auscultation Bilaterally.   Andrew Stuart

## 2023-05-16 NOTE — Transfer of Care (Signed)
 Immediate Anesthesia Transfer of Care Note  Patient: Andrew Stuart  Procedure(s) Performed: L3-4 DECOMPRESSION  Patient Location: PACU  Anesthesia Type:General  Level of Consciousness: drowsy  Airway & Oxygen Therapy: Patient Spontanous Breathing  Post-op Assessment: Report given to RN and Post -op Vital signs reviewed and stable  Post vital signs: Reviewed and stable  Last Vitals:  Vitals Value Taken Time  BP 140/74 05/16/23 1416  Temp    Pulse 81 05/16/23 1420  Resp 13 05/16/23 1420  SpO2 96 % 05/16/23 1420  Vitals shown include unfiled device data.  Last Pain:  Vitals:   05/16/23 1005  TempSrc: Temporal  PainSc: 2          Complications:  Encounter Notable Events  Notable Event Outcome Phase Comment  Difficult to intubate - unexpected  Intraprocedure Filed from anesthesia note documentation.

## 2023-05-16 NOTE — Anesthesia Procedure Notes (Addendum)
 Procedure Name: Intubation Date/Time: 05/16/2023 12:43 PM  Performed by: Janene Medici, RNPre-anesthesia Checklist: Patient identified, Emergency Drugs available, Suction available and Patient being monitored Patient Re-evaluated:Patient Re-evaluated prior to induction Oxygen Delivery Method: Circle system utilized Preoxygenation: Pre-oxygenation with 100% oxygen Induction Type: IV induction Ventilation: Mask ventilation without difficulty and Oral airway inserted - appropriate to patient size Laryngoscope Size: 4 Grade View: Grade I Tube type: Oral Tube size: 7.0 mm Number of attempts: 1 Airway Equipment and Method: Stylet and Oral airway Placement Confirmation: ETT inserted through vocal cords under direct vision, positive ETCO2 and breath sounds checked- equal and bilateral Secured at: 22 cm Tube secured with: Tape Dental Injury: Teeth and Oropharynx as per pre-operative assessment

## 2023-05-16 NOTE — Op Note (Signed)
 Indications: [name] is suffering from lumbar stenosis causing neurogenic claudication (ICD10 M48.062). The patient tried and failed conservative management, prompting surgical intervention.  Findings: lumbar stenosis  Preoperative Diagnosis: Lumbar Stenosis with neurogenic claudication Postoperative Diagnosis: same   EBL: 10 ml IVF:see anesthesia record Drains: none Disposition: Extubated and Stable to PACU Complications: none  No foley catheter was placed.   Preoperative Note:  Risks of surgery discussed include: infection, bleeding, stroke, coma, death, paralysis, CSF leak, nerve/spinal cord injury, numbness, tingling, weakness, complex regional pain syndrome, recurrent stenosis and/or disc herniation, vascular injury, development of instability, neck/back pain, need for further surgery, persistent symptoms, development of deformity, and the risks of anesthesia. The patient understood these risks and agreed to proceed.  Operative Note:   1. L3-4 lumbar decompression including central laminectomy and bilateral medial facetectomies including foraminotomies  The patient was then brought from the preoperative center with intravenous access established.  The patient underwent general anesthesia and endotracheal tube intubation, and was then rotated on the [place] rail top where all pressure points were appropriately padded.  The skin was then thoroughly cleansed.  Perioperative antibiotic prophylaxis was administered.  Sterile prep and drapes were then applied and a timeout was then observed.  C-arm was brought into the field under sterile conditions and under lateral visualization the L3-4 interspace was identified and marked.  The incision was marked on the right and injected with local anesthetic. Once this was complete a 2 cm incision was opened with the use of a #10 blade knife.    The metrx tubes were sequentially advanced and confirmed in position at L3-4. An 18mm by 50mm  tube was locked in place to the bed side attachment.  The microscope was then sterilely brought into the field and muscle creep was hemostased with a bipolar and resected with a pituitary rongeur.  A Bovie extender was then used to expose the spinous process and lamina.  Careful attention was placed to not violate the facet capsule. A 3 mm matchstick drill bit was then used to make a hemi-laminotomy trough until the ligamentum flavum was exposed.  This was extended to the base of the spinous process and to the contralateral side to remove all the central bone from each side.  Once this was complete and the underlying ligamentum flavum was visualized, it was dissected with a curette and resected with [place] rongeurs.  Extensive ligamentum hypertrophy was noted, requiring a substantial amount of time and care for removal.  The dura was identified and palpated. The kerrison rongeur was then used to remove the medial facet bilaterally until no compression was noted.  A balltip probe was used to confirm decompression of the ipsilateral L4 nerve root.  Additional attention was paid to completion of the contralateral L3-4 foraminotomy until the contralateral traversing nerve root was completely free.  Once this was complete, L3-4 central decompression including medial facetectomy and foraminotomy was confirmed and decompression on both sides was confirmed. No CSF leak was noted.  Depo-Medrol  was placed on the nerve root.  The wound was copiously irrigated. The tube system was then removed under microscopic visualization and hemostasis was obtained with a bipolar.    The fascial layer was reapproximated with the use of a 0 Vicryl suture.  Subcutaneous tissue layer was reapproximated using 2-0 Vicryl suture.  3-0 monocryl was placed in subcuticular fashion. The skin was then cleansed and Dermabond was used to close the skin opening.  Patient was then rotated back to the preoperative  bed awakened from anesthesia and  taken to recovery all counts are correct in this case.  I performed the entire procedure with the assistance of Anastacio Karvonen PA as an Designer, television/film set. An assistant was required for this procedure due to the complexity.  The assistant provided assistance in tissue manipulation and suction, and was required for the successful and safe performance of the procedure. I performed the critical portions of the procedure.   Hanny Elsberry K. Mont Antis MD

## 2023-05-16 NOTE — Progress Notes (Signed)
 Patient able to walk to and from restroom,voided and is keeping po fluids and crackers down. No issues with walking or voiding.

## 2023-05-16 NOTE — Discharge Instructions (Addendum)
 Your surgeon has performed an operation on your lumbar spine (low back) to relieve pressure on one or more nerves. Many times, patients feel better immediately after surgery and can "overdo it." Even if you feel well, it is important that you follow these activity guidelines. If you do not let your back heal properly from the surgery, you can increase the chance of a disc herniation and/or return of your symptoms. The following are instructions to help in your recovery once you have been discharged from the hospital.  * It is ok to take NSAIDs after surgery.  Activity    No bending, lifting, or twisting ("BLT"). Avoid lifting objects heavier than 10 pounds (gallon milk jug).  Where possible, avoid household activities that involve lifting, bending, pushing, or pulling such as laundry, vacuuming, grocery shopping, and childcare. Try to arrange for help from friends and family for these activities while your back heals.  Increase physical activity slowly as tolerated.  Taking short walks is encouraged, but avoid strenuous exercise. Do not jog, run, bicycle, lift weights, or participate in any other exercises unless specifically allowed by your doctor. Avoid prolonged sitting, including car rides.  Talk to your doctor before resuming sexual activity.  You should not drive until cleared by your doctor.  Until released by your doctor, you should not return to work or school.  You should rest at home and let your body heal.   You may shower three days after your surgery.  After showering, lightly dab your incision dry. Do not take a tub bath or go swimming for 3 weeks, or until approved by your doctor at your follow-up appointment.  If you smoke, we strongly recommend that you quit.  Smoking has been proven to interfere with normal healing in your back and will dramatically reduce the success rate of your surgery. Please contact [facility] (800-QUIT-NOW) and use the resources at www.[facility].com for  assistance in stopping smoking.  Surgical Incision   If you have a dressing on your incision, you may remove it three days after your surgery. Keep your incision area clean and dry.  If you have staples or stitches on your incision, you should have a follow up scheduled for removal. If you do not have staples or stitches, you will have steri-strips (small pieces of surgical tape) or Dermabond glue. The steri-strips/glue should begin to peel away within about a week (it is fine if the steri-strips fall off before then). If the strips are still in place one week after your surgery, you may gently remove them.  Diet            You may return to your usual diet. Be sure to stay hydrated.  When to Contact us  Although your surgery and recovery will likely be uneventful, you may have some residual numbness, aches, and pains in your back and/or legs. This is normal and should improve in the next few weeks.  However, should you experience any of the following, contact us immediately: New numbness or weakness Pain that is progressively getting worse, and is not relieved by your pain medications or rest Bleeding, redness, swelling, pain, or drainage from surgical incision Chills or flu-like symptoms Fever greater than 101.0 F (38.3 C) Problems with bowel or bladder functions Difficulty breathing or shortness of breath Warmth, tenderness, or swelling in your calf  Contact Information How to contact us:  If you have any questions/concerns before or after surgery, you can reach [place] at [phone], or you can  send a [employer]. We can be reached by phone or mychart 8am-4pm, Monday-Friday.  *Please note: Calls after 4pm are forwarded to a third party answering service. Mychart messages are not routinely monitored during evenings, weekends, and holidays. Please call our office to contact the answering service for urgent concerns during non-business hours.

## 2023-05-17 ENCOUNTER — Encounter: Payer: Self-pay | Admitting: Neurosurgery

## 2023-05-17 NOTE — Anesthesia Postprocedure Evaluation (Signed)
Anesthesia Post Note  Patient: Andrew Stuart  Procedure(s) Performed: L3-4 DECOMPRESSION  Patient location during evaluation: PACU Anesthesia Type: General Level of consciousness: awake and alert Pain management: pain level controlled Vital Signs Assessment: post-procedure vital signs reviewed and stable Respiratory status: spontaneous breathing, nonlabored ventilation and respiratory function stable Cardiovascular status: blood pressure returned to baseline and stable Postop Assessment: no apparent nausea or vomiting Anesthetic complications: yes   Encounter Notable Events  Notable Event Outcome Phase Comment  Difficult to intubate - unexpected  Intraprocedure Filed from anesthesia note documentation.     Last Vitals:  Vitals:   05/16/23 1445 05/16/23 1500  BP: 129/77 124/83  Pulse: 70 73  Resp: 19 16  Temp: (!) 36.3 C 36.4 C  SpO2: 97% 98%    Last Pain:  Vitals:   05/17/23 0823  TempSrc:   PainSc: 0-No pain                 Foye Deer

## 2023-05-29 ENCOUNTER — Ambulatory Visit (INDEPENDENT_AMBULATORY_CARE_PROVIDER_SITE_OTHER): Payer: Medicare HMO | Admitting: Physician Assistant

## 2023-05-29 VITALS — BP 122/78 | Temp 97.7°F | Ht 69.0 in | Wt 165.0 lb

## 2023-05-29 DIAGNOSIS — Z09 Encounter for follow-up examination after completed treatment for conditions other than malignant neoplasm: Secondary | ICD-10-CM

## 2023-05-29 DIAGNOSIS — M48062 Spinal stenosis, lumbar region with neurogenic claudication: Secondary | ICD-10-CM

## 2023-05-29 NOTE — Progress Notes (Signed)
   REFERRING PHYSICIAN:  [name], Md [address] [place],  [place] [address]  DOS: 05/16/23, L3-4 Decompression  HISTORY OF PRESENT ILLNESS: Andrew Stuart is approximately 2 weeks status post L3-4 decompression. he is doing well. He is only taking the Celebrex and Robaxin as needed for pain.  PHYSICAL EXAMINATION:  General: Patient is well developed, well nourished, calm, collected, and in no apparent distress.   NEUROLOGICAL:  General: In no acute distress.   Awake, alert, oriented to person, place, and time.  Pupils equal round and reactive to light.  Facial tone is symmetric.    Strength:            Side Iliopsoas Quads Hamstring PF DF EHL  R 5 4+ 5 5 5 5   L 5 5 5 5 5 5    Incision c/d/i   ROS (Neurologic):  Negative except as noted above  IMAGING: No new imaging today  ASSESSMENT/PLAN:  Andrew Stuart is approximately 2 weeks status post L3-4 decompression. he is doing well. He is only taking the Celebrex and Robaxin as needed for pain. Incision is well appearing and exam is improving.I have advised the patient to lift up to 10 pounds until 6 weeks after surgery, then increase up to 25 pounds until 12 weeks after surgery.  After 12 weeks post-op, the patient advised to increase activity as tolerated.  Advised to contact the office if any questions or concerns arise.  Joan Flores PA-C Department of neurosurgery

## 2023-06-12 ENCOUNTER — Encounter: Payer: Self-pay | Admitting: Neurosurgery

## 2023-06-15 ENCOUNTER — Ambulatory Visit: Payer: Medicare HMO

## 2023-06-18 ENCOUNTER — Ambulatory Visit (INDEPENDENT_AMBULATORY_CARE_PROVIDER_SITE_OTHER): Payer: Medicare HMO

## 2023-06-18 DIAGNOSIS — E538 Deficiency of other specified B group vitamins: Secondary | ICD-10-CM | POA: Diagnosis not present

## 2023-06-18 MED ORDER — CYANOCOBALAMIN 1000 MCG/ML IJ SOLN
1000.0000 ug | Freq: Once | INTRAMUSCULAR | Status: AC
  Administered 2023-06-18: 1000 ug via INTRAMUSCULAR

## 2023-06-18 NOTE — Progress Notes (Signed)
Pt here for monthly B12 injection per Dr. Yetta Barre  B12 given IM and pt tolerated injection well.

## 2023-06-26 ENCOUNTER — Ambulatory Visit (INDEPENDENT_AMBULATORY_CARE_PROVIDER_SITE_OTHER): Payer: Medicare HMO | Admitting: Neurosurgery

## 2023-06-26 VITALS — BP 132/82 | Temp 97.6°F | Ht 69.0 in | Wt 165.0 lb

## 2023-06-26 DIAGNOSIS — M48062 Spinal stenosis, lumbar region with neurogenic claudication: Secondary | ICD-10-CM

## 2023-06-26 DIAGNOSIS — G959 Disease of spinal cord, unspecified: Secondary | ICD-10-CM

## 2023-06-26 NOTE — Progress Notes (Signed)
   REFERRING PHYSICIAN:  [name], Md [address] [place],  [place] [address]  DOS: 05/16/23, L3-4 Decompression  HISTORY OF PRESENT ILLNESS: Andrew Stuart is status post L3-4 decompression. he is doing well.  He is having some low back discomfort.  Overall, his walking is better and he has no leg pain.  His numbness has improved.  He does have some musculoskeletal discomfort and tightness in his neck.  PHYSICAL EXAMINATION:  General: Patient is well developed, well nourished, calm, collected, and in no apparent distress.   NEUROLOGICAL:  General: In no acute distress.   Awake, alert, oriented to person, place, and time.  Pupils equal round and reactive to light.  Facial tone is symmetric.    Strength:            Side Iliopsoas Quads Hamstring PF DF EHL  R 5 5 5 5 5 5   L 5 5 5 5 5 5    Incision c/d/i   ROS (Neurologic):  Negative except as noted above  IMAGING: No new imaging today  ASSESSMENT/PLAN:  KAZMIR OKI is status post L3-4 decompression.  I am pleased with his improvements.  His walking is better.  I think his back pain will continue to improve over the next few months.  I recommended that he start increasing his activity.  We discussed the limitations.  We will see him back in clinic in approximately 6 weeks.    Venetia Night MD Department of neurosurgery

## 2023-07-09 ENCOUNTER — Encounter: Payer: Self-pay | Admitting: Internal Medicine

## 2023-07-09 ENCOUNTER — Ambulatory Visit (INDEPENDENT_AMBULATORY_CARE_PROVIDER_SITE_OTHER): Payer: Medicare HMO | Admitting: Internal Medicine

## 2023-07-09 VITALS — BP 144/78 | HR 75 | Temp 97.7°F | Resp 16 | Ht 69.0 in | Wt 170.4 lb

## 2023-07-09 DIAGNOSIS — I10 Essential (primary) hypertension: Secondary | ICD-10-CM | POA: Diagnosis not present

## 2023-07-09 DIAGNOSIS — E538 Deficiency of other specified B group vitamins: Secondary | ICD-10-CM

## 2023-07-09 DIAGNOSIS — E785 Hyperlipidemia, unspecified: Secondary | ICD-10-CM

## 2023-07-09 DIAGNOSIS — G320 Subacute combined degeneration of spinal cord in diseases classified elsewhere: Secondary | ICD-10-CM | POA: Diagnosis not present

## 2023-07-09 LAB — URINALYSIS, ROUTINE W REFLEX MICROSCOPIC
Bilirubin Urine: NEGATIVE
Hgb urine dipstick: NEGATIVE
Ketones, ur: NEGATIVE
Leukocytes,Ua: NEGATIVE
Nitrite: NEGATIVE
RBC / HPF: NONE SEEN (ref 0–?)
Specific Gravity, Urine: <=1.005 — AB (ref 1.000–1.030)
Total Protein, Urine: NEGATIVE
Urine Glucose: NEGATIVE
Urobilinogen, UA: 0.2 (ref 0.0–1.0)
WBC, UA: NONE SEEN (ref 0–?)
pH: 6 (ref 5.0–8.0)

## 2023-07-09 LAB — LIPID PANEL
Cholesterol: 163 mg/dL (ref 0–200)
HDL: 62.3 mg/dL (ref >39.00)
LDL Cholesterol: 82 mg/dL (ref 0–99)
NonHDL: 100.88
Total CHOL/HDL Ratio: 3
Triglycerides: 93 mg/dL (ref 0.0–149.0)
VLDL: 18.6 mg/dL (ref 0.0–40.0)

## 2023-07-09 LAB — HEPATIC FUNCTION PANEL
ALT: 20 U/L (ref 0–53)
AST: 23 U/L (ref 0–37)
Albumin: 4.5 g/dL (ref 3.5–5.2)
Alkaline Phosphatase: 99 U/L (ref 39–117)
Bilirubin, Direct: 0.2 mg/dL (ref 0.0–0.3)
Total Bilirubin: 0.7 mg/dL (ref 0.2–1.2)
Total Protein: 7.1 g/dL (ref 6.0–8.3)

## 2023-07-09 LAB — CBC WITH DIFFERENTIAL/PLATELET
Basophils Absolute: 0 10*3/uL (ref 0.0–0.1)
Basophils Relative: 0.4 % (ref 0.0–3.0)
Eosinophils Absolute: 0.2 10*3/uL (ref 0.0–0.7)
Eosinophils Relative: 3.6 % (ref 0.0–5.0)
HCT: 40.3 % (ref 39.0–52.0)
Hemoglobin: 13.4 g/dL (ref 13.0–17.0)
Lymphocytes Relative: 29.5 % (ref 12.0–46.0)
Lymphs Abs: 1.8 10*3/uL (ref 0.7–4.0)
MCHC: 33.2 g/dL (ref 30.0–36.0)
MCV: 83.7 fl (ref 78.0–100.0)
Monocytes Absolute: 0.5 10*3/uL (ref 0.1–1.0)
Monocytes Relative: 8.3 % (ref 3.0–12.0)
Neutro Abs: 3.6 10*3/uL (ref 1.4–7.7)
Neutrophils Relative %: 58.2 % (ref 43.0–77.0)
Platelets: 263 10*3/uL (ref 150.0–400.0)
RBC: 4.82 Mil/uL (ref 4.22–5.81)
RDW: 15.3 % (ref 11.5–15.5)
WBC: 6.2 10*3/uL (ref 4.0–10.5)

## 2023-07-09 LAB — TSH: TSH: 2.1 u[IU]/mL (ref 0.35–5.50)

## 2023-07-09 MED ORDER — CYANOCOBALAMIN 1000 MCG/ML IJ SOLN
1000.0000 ug | Freq: Once | INTRAMUSCULAR | Status: AC
  Administered 2023-07-09: 1000 ug via INTRAMUSCULAR

## 2023-07-09 NOTE — Progress Notes (Unsigned)
 Subjective:  Patient ID: Andrew Stuart, male    DOB: 1950/12/11  Age: 73 y.o. MRN: [id number]  CC: Hypertension and Hyperlipidemia   HPI EBERT FORRESTER presents for f/up ---  Discussed the use of AI scribe software for clinical note transcription with the patient, who gave verbal consent to proceed.  History of Present Illness   He is a 73 year old who presents for follow-up after neck surgery and to discuss ongoing symptoms.  He underwent neck surgery, specifically a laminectomy and laminoplasty, which significantly improved his condition by resolving previous numbness. However, he continues to experience stiffness, particularly in the lower and upper back. The stiffness is described as tightness rather than pain and is expected to persist for a few more months in the lower back and up to a year in the upper back. He takes Celebrex daily for arthritis, which helps manage stiffness, especially when taken in the morning. Over the past six months, he has taken three muscle relaxers to alleviate stiffness. No current numbness, weakness, or tingling is reported.  He experienced post-surgical dizziness and lightheadedness, which resolved after adjusting medications, including gabapentin, Tylenol, and muscle relaxers. He has not experienced dizziness or lightheadedness since the adjustments.  He has a history of chronic venous insufficiency in his legs, causing achiness if compression socks are not worn. No chest pain, shortness of breath, dizziness, or palpitations during physical activity, including walking two miles and tackling small hills. He describes a sensation similar to running in cold weather but denies any coughing or wheezing.  He underwent cardiac monitoring during a hospital stay in September due to fainting episodes, which have since resolved. No chest pain or major cardiac symptoms have been experienced since then.  He has a history of constipation, managed with prune juice, bran  flakes, and 250 mg of magnesium. Occasional irritation from hemorrhoids is noted, but no blood in the stool or cramping. He recently started taking 23 mg of iron due to mild anemia noted after neck surgery, possibly related to blood loss.  He mentions a negative stool test from a recent in-home health check and considers it time for a colonoscopy, as it has been nine years since the last one, which showed no polyps.       Outpatient Medications Prior to Visit  Medication Sig Dispense Refill   atorvastatin (LIPITOR) 20 MG tablet TAKE 1 TABLET BY MOUTH EVERY DAY (Patient taking differently: Take 20 mg by mouth every evening.) 90 tablet 1   celecoxib (CELEBREX) 200 MG capsule TAKE 1 CAPSULE BY MOUTH EVERY DAY (Patient taking differently: Take 200 mg by mouth daily as needed (pain.).) 30 capsule 1   cyanocobalamin (VITAMIN B12) 1000 MCG/ML injection Inject 1,000 mcg into the muscle every 30 (thirty) days.     Ferrous Sulfate (IRON PO) Take 23 mg by mouth daily in the afternoon.     Magnesium 250 MG TABS Take 250 mg by mouth in the morning.     methocarbamol (ROBAXIN) 500 MG tablet Take 1 tablet (500 mg total) by mouth every 8 (eight) hours as needed for muscle spasms. 90 tablet 0   Multiple Vitamin (MULTIVITAMIN WITH MINERALS) TABS tablet Take 1 tablet by mouth in the morning. MultiVites Gummy     No facility-administered medications prior to visit.    ROS Review of Systems  Constitutional:  Positive for fatigue and unexpected weight change (wt gain). Negative for appetite change, chills and diaphoresis.  HENT: Negative.    Eyes:  Negative.   Respiratory:  Negative for chest tightness, shortness of breath and wheezing.   Cardiovascular:  Positive for leg swelling. Negative for chest pain and palpitations.  Gastrointestinal: Negative.  Negative for abdominal pain, constipation, diarrhea, nausea and vomiting.  Genitourinary:  Negative for difficulty urinating.  Musculoskeletal: Negative.   Skin:  Negative.   Neurological: Negative.  Negative for dizziness, weakness and light-headedness.  Hematological:  Negative for adenopathy. Does not bruise/bleed easily.  Psychiatric/Behavioral: Negative.      Objective:  BP (!) 144/78 (BP Location: Left Arm, Patient Position: Sitting, Cuff Size: Normal)   Pulse 75   Temp 97.7 F (36.5 C) (Oral)   Resp 16   Ht 5\' 9"  (1.753 m)   Wt 170 lb 6.4 oz (77.3 kg)   SpO2 99%   BMI 25.16 kg/m   BP Readings from Last 3 Encounters:  07/09/23 (!) 144/78  06/26/23 132/82  05/29/23 122/78    Wt Readings from Last 3 Encounters:  07/09/23 170 lb 6.4 oz (77.3 kg)  06/26/23 165 lb (74.8 kg)  05/29/23 165 lb (74.8 kg)    Physical Exam Vitals reviewed.  Constitutional:      Appearance: Normal appearance.  HENT:     Mouth/Throat:     Mouth: Mucous membranes are moist.  Eyes:     General: No scleral icterus.    Conjunctiva/sclera: Conjunctivae normal.  Cardiovascular:     Rate and Rhythm: Normal rate and regular rhythm.     Heart sounds: No murmur heard.    No friction rub. No gallop.     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     Breath sounds: No stridor. No wheezing, rhonchi or rales.  Abdominal:     Palpations: There is no mass.     Tenderness: There is no abdominal tenderness. There is no guarding.     Hernia: No hernia is present.  Musculoskeletal:     Cervical back: Neck supple.  Lymphadenopathy:     Cervical: No cervical adenopathy.  Neurological:     Mental Status: He is alert.     Lab Results  Component Value Date   WBC 6.2 07/09/2023   HGB 13.4 07/09/2023   HCT 40.3 07/09/2023   PLT 263.0 07/09/2023   GLUCOSE 103 (H) 05/08/2023   CHOL 163 07/09/2023   TRIG 93.0 07/09/2023   HDL 62.30 07/09/2023   LDLCALC 82 07/09/2023   ALT 20 07/09/2023   AST 23 07/09/2023   NA 140 05/08/2023   K 4.2 05/08/2023   CL 104  05/08/2023   CREATININE 0.83 05/08/2023   BUN 17 05/08/2023   CO2 27 05/08/2023   TSH 2.10 07/09/2023   PSA 1.88 01/08/2023   HGBA1C 5.9 10/06/2021    DG Lumbar Spine 2-3 Views Result Date: 05/16/2023 CLINICAL DATA:  Elective surgery. EXAM: LUMBAR SPINE - 2-3 VIEW COMPARISON:  None Available. FINDINGS: Two fluoroscopic spot views of the lumbar spine obtained in the operating room. Surgical instruments localize posteriorly at the L3-L4 level. Fluoroscopy time 2 seconds. Dose 1.31 mGy. IMPRESSION: Intraoperative fluoroscopy during lumbar surgery. Electronically Signed   By: Narda Rutherford M.D.   On: 05/16/2023 15:03   DG C-Arm 1-60 Min-No Report Result Date: 05/16/2023 Fluoroscopy was utilized by the requesting physician.  No radiographic interpretation.    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  Sanda Linger, MD

## 2023-07-09 NOTE — Patient Instructions (Signed)
 Hypertension, Adult High blood pressure (hypertension) is when the force of blood pumping through the arteries is too strong. The arteries are the blood vessels that carry blood from the heart throughout the body. Hypertension forces the heart to work harder to pump blood and may cause arteries to become narrow or stiff. Untreated or uncontrolled hypertension can lead to a heart attack, heart failure, a stroke, kidney disease, and other problems. A blood pressure reading consists of a higher number over a lower number. Ideally, your blood pressure should be below 120/80. The first ("top") number is called the systolic pressure. It is a measure of the pressure in your arteries as your heart beats. The second ("bottom") number is called the diastolic pressure. It is a measure of the pressure in your arteries as the heart relaxes. What are the causes? The exact cause of this condition is not known. There are some conditions that result in high blood pressure. What increases the risk? Certain factors may make you more likely to develop high blood pressure. Some of these risk factors are under your control, including: Smoking. Not getting enough exercise or physical activity. Being overweight. Having too much fat, sugar, calories, or salt (sodium) in your diet. Drinking too much alcohol. Other risk factors include: Having a personal history of heart disease, diabetes, high cholesterol, or kidney disease. Stress. Having a family history of high blood pressure and high cholesterol. Having obstructive sleep apnea. Age. The risk increases with age. What are the signs or symptoms? High blood pressure may not cause symptoms. Very high blood pressure (hypertensive crisis) may cause: Headache. Fast or irregular heartbeats (palpitations). Shortness of breath. Nosebleed. Nausea and vomiting. Vision changes. Severe chest pain, dizziness, and seizures. How is this diagnosed? This condition is diagnosed by  measuring your blood pressure while you are seated, with your arm resting on a flat surface, your legs uncrossed, and your feet flat on the floor. The cuff of the blood pressure monitor will be placed directly against the skin of your upper arm at the level of your heart. Blood pressure should be measured at least twice using the same arm. Certain conditions can cause a difference in blood pressure between your right and left arms. If you have a high blood pressure reading during one visit or you have normal blood pressure with other risk factors, you may be asked to: Return on a different day to have your blood pressure checked again. Monitor your blood pressure at home for 1 week or longer. If you are diagnosed with hypertension, you may have other blood or imaging tests to help your health care provider understand your overall risk for other conditions. How is this treated? This condition is treated by making healthy lifestyle changes, such as eating healthy foods, exercising more, and reducing your alcohol intake. You may be referred for counseling on a healthy diet and physical activity. Your health care provider may prescribe medicine if lifestyle changes are not enough to get your blood pressure under control and if: Your systolic blood pressure is above 130. Your diastolic blood pressure is above 80. Your personal target blood pressure may vary depending on your medical conditions, your age, and other factors. Follow these instructions at home: Eating and drinking  Eat a diet that is high in fiber and potassium, and low in sodium, added sugar, and fat. An example of this eating plan is called the DASH diet. DASH stands for Dietary Approaches to Stop Hypertension. To eat this way: Eat  plenty of fresh fruits and vegetables. Try to fill one half of your plate at each meal with fruits and vegetables. Eat whole grains, such as whole-wheat pasta, brown rice, or whole-grain bread. Fill about one  fourth of your plate with whole grains. Eat or drink low-fat dairy products, such as skim milk or low-fat yogurt. Avoid fatty cuts of meat, processed or cured meats, and poultry with skin. Fill about one fourth of your plate with lean proteins, such as fish, chicken without skin, beans, eggs, or tofu. Avoid pre-made and processed foods. These tend to be higher in sodium, added sugar, and fat. Reduce your daily sodium intake. Many people with hypertension should eat less than 1,500 mg of sodium a day. Do not drink alcohol if: Your health care provider tells you not to drink. You are pregnant, may be pregnant, or are planning to become pregnant. If you drink alcohol: Limit how much you have to: 0-1 drink a day for women. 0-2 drinks a day for men. Know how much alcohol is in your drink. In the [place] one drink equals one 12 oz bottle of beer (355 mL), one 5 oz glass of wine (148 mL), or one 1 oz glass of hard liquor (44 mL). Lifestyle  Work with your health care provider to maintain a healthy body weight or to lose weight. Ask what an ideal weight is for you. Get at least 30 minutes of exercise that causes your heart to beat faster (aerobic exercise) most days of the week. Activities may include walking, swimming, or biking. Include exercise to strengthen your muscles (resistance exercise), such as Pilates or lifting weights, as part of your weekly exercise routine. Try to do these types of exercises for 30 minutes at least 3 days a week. Do not use any products that contain nicotine or tobacco. These products include cigarettes, chewing tobacco, and vaping devices, such as e-cigarettes. If you need help quitting, ask your health care provider. Monitor your blood pressure at home as told by your health care provider. Keep all follow-up visits. This is important. Medicines Take over-the-counter and prescription medicines only as told by your health care provider. Follow directions carefully. Blood  pressure medicines must be taken as prescribed. Do not skip doses of blood pressure medicine. Doing this puts you at risk for problems and can make the medicine less effective. Ask your health care provider about side effects or reactions to medicines that you should watch for. Contact a health care provider if you: Think you are having a reaction to a medicine you are taking. Have headaches that keep coming back (recurring). Feel dizzy. Have swelling in your ankles. Have trouble with your vision. Get help right away if you: Develop a severe headache or confusion. Have unusual weakness or numbness. Feel faint. Have severe pain in your chest or abdomen. Vomit repeatedly. Have trouble breathing. These symptoms may be an emergency. Get help right away. Call 911. Do not wait to see if the symptoms will go away. Do not drive yourself to the hospital. Summary Hypertension is when the force of blood pumping through your arteries is too strong. If this condition is not controlled, it may put you at risk for serious complications. Your personal target blood pressure may vary depending on your medical conditions, your age, and other factors. For most people, a normal blood pressure is less than 120/80. Hypertension is treated with lifestyle changes, medicines, or a combination of both. Lifestyle changes include losing weight, eating a healthy,  low-sodium diet, exercising more, and limiting alcohol. This information is not intended to replace advice given to you by your health care provider. Make sure you discuss any questions you have with your health care provider. Document Revised: 02/22/2021 Document Reviewed: 02/22/2021 Elsevier Patient Education  2024 [employer].

## 2023-07-10 ENCOUNTER — Telehealth: Payer: Self-pay | Admitting: Internal Medicine

## 2023-07-10 ENCOUNTER — Encounter: Payer: Self-pay | Admitting: Internal Medicine

## 2023-07-10 ENCOUNTER — Telehealth: Payer: Self-pay | Admitting: Orthopedic Surgery

## 2023-07-10 DIAGNOSIS — Z9889 Other specified postprocedural states: Secondary | ICD-10-CM

## 2023-07-10 DIAGNOSIS — G959 Disease of spinal cord, unspecified: Secondary | ICD-10-CM

## 2023-07-10 MED ORDER — ATORVASTATIN CALCIUM 20 MG PO TABS: 20.0000 mg | ORAL_TABLET | Freq: Every day | ORAL | 1 refills | Status: DC

## 2023-07-10 NOTE — Telephone Encounter (Signed)
 Patient notified

## 2023-07-10 NOTE — Telephone Encounter (Signed)
 Copied from CRM [id number]. Topic: Referral - Question >> Jul 10, 2023  9:29 AM Adaysia C wrote: Reason for CRM: Janeala with [facility] called to inform the provider on the referral [phone], the Cardiac Scoring needs to be changed to [facility] ONLY before they can proceed with the referral; Please follow up with [facility] if necessary #[phone]

## 2023-07-10 NOTE — Telephone Encounter (Signed)
 DOS: 05/16/23, L3-4 Decompression   Refill for celebrex sent to pharmacy. Please let him know.

## 2023-07-11 ENCOUNTER — Other Ambulatory Visit: Payer: Self-pay | Admitting: Internal Medicine

## 2023-07-11 DIAGNOSIS — E785 Hyperlipidemia, unspecified: Secondary | ICD-10-CM

## 2023-07-11 NOTE — Telephone Encounter (Signed)
 Please change the order

## 2023-07-26 ENCOUNTER — Other Ambulatory Visit: Payer: Self-pay | Admitting: Internal Medicine

## 2023-07-26 ENCOUNTER — Encounter: Payer: Self-pay | Admitting: Internal Medicine

## 2023-07-26 DIAGNOSIS — Z1211 Encounter for screening for malignant neoplasm of colon: Secondary | ICD-10-CM | POA: Insufficient documentation

## 2023-07-30 ENCOUNTER — Encounter: Payer: Self-pay | Admitting: Gastroenterology

## 2023-08-06 ENCOUNTER — Ambulatory Visit (INDEPENDENT_AMBULATORY_CARE_PROVIDER_SITE_OTHER)

## 2023-08-06 DIAGNOSIS — E538 Deficiency of other specified B group vitamins: Secondary | ICD-10-CM | POA: Diagnosis not present

## 2023-08-06 MED ORDER — CYANOCOBALAMIN 1000 MCG/ML IJ SOLN
1000.0000 ug | Freq: Once | INTRAMUSCULAR | Status: AC
Start: 2023-08-06 — End: 2023-08-06
  Administered 2023-08-06: 1000 ug via INTRAMUSCULAR

## 2023-08-06 NOTE — Progress Notes (Signed)
 After obtaining consent, and per orders of Dr. Yetta Barre, injection of B12 given by Ferdie Ping. Patient instructed to report any adverse reaction to me immediately.

## 2023-08-07 ENCOUNTER — Encounter: Payer: Self-pay | Admitting: Neurosurgery

## 2023-08-07 ENCOUNTER — Ambulatory Visit (INDEPENDENT_AMBULATORY_CARE_PROVIDER_SITE_OTHER): Payer: Medicare HMO | Admitting: Neurosurgery

## 2023-08-07 VITALS — BP 142/70 | Ht 69.0 in | Wt 170.0 lb

## 2023-08-07 DIAGNOSIS — Z09 Encounter for follow-up examination after completed treatment for conditions other than malignant neoplasm: Secondary | ICD-10-CM

## 2023-08-07 NOTE — Progress Notes (Signed)
   REFERRING PHYSICIAN:  [name], Md [address] [place],  [place] [address]  DOS: 05/16/23, L3-4 Decompression  HISTORY OF PRESENT ILLNESS:  08/07/23 Andrew Stuart is a 73 y.o presenting today for 6 week follow up. He has done well with some stiffness in his and some intermittent discomfort down his right leg but is overall significantly improved from preop.  He has returned to largely normal activities.  He is taking Celebrex as needed.  06/26/23 Andrew Stuart is status post L3-4 decompression. he is doing well.  He is having some low back discomfort.  Overall, his walking is better and he has no leg pain.  His numbness has improved.  He does have some musculoskeletal discomfort and tightness in his neck.  PHYSICAL EXAMINATION:  General: Patient is well developed, well nourished, calm, collected, and in no apparent distress.   NEUROLOGICAL:  General: In no acute distress.   Awake, alert, oriented to person, place, and time.  Pupils equal round and reactive to light.  Facial tone is symmetric.    Strength:            Side Iliopsoas Quads Hamstring PF DF EHL  R 5 5 5 5 5 5   L 5 5 5 5 5 5    Incision well-healed   ROS (Neurologic):  Negative except as noted above  IMAGING: No new imaging today  ASSESSMENT/PLAN:  Andrew Stuart is status post L3-4 decompression.  I am pleased with his improvements.  I recommended that he resume activities as tolerated.  Will see him going forward on an as-needed basis.  He expressed understanding and was in agreement with this plan.  Manning Charity PA-C Department of neurosurgery

## 2023-08-08 ENCOUNTER — Ambulatory Visit
Admission: RE | Admit: 2023-08-08 | Discharge: 2023-08-08 | Disposition: A | Discharge status: Home / Self Care | Source: Ambulatory Visit | Attending: Internal Medicine | Admitting: Internal Medicine

## 2023-08-08 DIAGNOSIS — E785 Hyperlipidemia, unspecified: Secondary | ICD-10-CM

## 2023-08-12 ENCOUNTER — Encounter: Payer: Self-pay | Admitting: Internal Medicine

## 2023-09-03 ENCOUNTER — Other Ambulatory Visit: Payer: Self-pay | Admitting: Orthopedic Surgery

## 2023-09-03 DIAGNOSIS — Z9889 Other specified postprocedural states: Secondary | ICD-10-CM

## 2023-09-03 DIAGNOSIS — G959 Disease of spinal cord, unspecified: Secondary | ICD-10-CM

## 2023-09-06 ENCOUNTER — Ambulatory Visit

## 2023-09-10 ENCOUNTER — Ambulatory Visit (INDEPENDENT_AMBULATORY_CARE_PROVIDER_SITE_OTHER)

## 2023-09-10 DIAGNOSIS — E538 Deficiency of other specified B group vitamins: Secondary | ICD-10-CM

## 2023-09-10 MED ORDER — CYANOCOBALAMIN 1000 MCG/ML IJ SOLN
1000.0000 ug | Freq: Once | INTRAMUSCULAR | Status: AC
  Administered 2023-09-10: 1000 ug via INTRAMUSCULAR

## 2023-09-10 NOTE — Progress Notes (Signed)
Pt here for monthly B12 injection per Dr. Yetta Barre  B12 given IM. and pt tolerated injection well.

## 2023-09-14 ENCOUNTER — Ambulatory Visit (INDEPENDENT_AMBULATORY_CARE_PROVIDER_SITE_OTHER): Admitting: Gastroenterology

## 2023-09-14 ENCOUNTER — Encounter: Payer: Self-pay | Admitting: Gastroenterology

## 2023-09-14 VITALS — BP 112/62 | HR 76 | Ht 69.0 in | Wt 170.5 lb

## 2023-09-14 DIAGNOSIS — K625 Hemorrhage of anus and rectum: Secondary | ICD-10-CM | POA: Diagnosis not present

## 2023-09-14 DIAGNOSIS — Z800 Family history of malignant neoplasm of digestive organs: Secondary | ICD-10-CM | POA: Diagnosis not present

## 2023-09-14 MED ORDER — NA SULFATE-K SULFATE-MG SULF 17.5-3.13-1.6 GM/177ML PO SOLN: 1.0000 | Freq: Once | ORAL | 0 refills | Status: AC

## 2023-09-14 NOTE — Patient Instructions (Addendum)
 Start Miralax  1 capful daily in 8 ounces of liquid.  You have been scheduled for a colonoscopy. Please follow written instructions given to you at your visit today.   If you use inhalers (even only as needed), please bring them with you on the day of your procedure.  DO NOT TAKE 7 DAYS PRIOR TO TEST- Trulicity (dulaglutide) Ozempic, Wegovy (semaglutide) Mounjaro (tirzepatide) Bydureon Bcise (exanatide extended release)  DO NOT TAKE 1 DAY PRIOR TO YOUR TEST Rybelsus (semaglutide) Adlyxin (lixisenatide) Victoza (liraglutide) Byetta (exanatide)  _______________________________________________________  If your blood pressure at your visit was 140/90 or greater, please contact your primary care physician to follow up on this.  _______________________________________________________  If you are age 21 or older, your body mass index should be between 23-30. Your Body mass index is 25.18 kg/m. If this is out of the aforementioned range listed, please consider follow up with your Primary Care Provider.  If you are age 38 or younger, your body mass index should be between 19-25. Your Body mass index is 25.18 kg/m. If this is out of the aformentioned range listed, please consider follow up with your Primary Care Provider.   ________________________________________________________  The [facility] providers would like to encourage you to use MYCHART to communicate with providers for non-urgent requests or questions.  Due to long hold times on the telephone, sending your provider a message by [facility] may be a faster and more efficient way to get a response.  Please allow 48 business hours for a response.  Please remember that this is for non-urgent requests.  _______________________________________________________

## 2023-09-14 NOTE — Progress Notes (Signed)
 09/14/2023 Andrew Stuart [id number] [birth year]   HISTORY OF PRESENT ILLNESS: This is a 73 year old male who is new to our office.  He is here today to discuss and schedule colonoscopy.  Tells me that he had a colonoscopy around 2006 in [place]  that was normal.  And had another colonoscopy in 2016 at [facility] in [place], [place] .  He says they were both normal.  His mother did have colon cancer though and was diagnosed around age 85.  Patient reports some constipation, sometimes skips a day without a bowel movement.  Sometimes has a harder stool.  When he does have a stool that he will see some red rectal bleeding.  Tells me he has had a history of hemorrhoidectomy in his 30s.   Past Medical History:  Diagnosis Date   Abnormal EKG    Allergy    Arthritis    BPH (benign prostatic hyperplasia)    Carpal tunnel syndrome, left    Cauda equina compression (HCC)    Cervical stenosis of spine    severe C3-4   Chronic venous insufficiency    Complication of anesthesia    Elevated blood pressure reading    Hepatitis    History of kidney stones    Hyperglycemia    Hyperlipidemia    Idiopathic progressive neuropathy    Neurogenic claudication due to lumbar spinal stenosis    Right lumbar radiculitis    Spinal stenosis, lumbar region with neurogenic claudication    Syncopal episodes    post- surgery   Varicose veins of both lower extremities    Vitamin B 12 deficiency    Past Surgical History:  Procedure Laterality Date   CARPAL TUNNEL RELEASE Right 2015   CARPAL TUNNEL RELEASE Left 2021   COLONOSCOPY     ENDOVENOUS ABLATION SAPHENOUS VEIN W/ LASER Left 04/12/2022   endovenous laser ablation left greater saphenous vein and stab phlebectomy < 10 incisions left leg by Andrew Perfect MD   HEMORRHOID SURGERY     HERNIA REPAIR     LAMINOPLASTY     Sept 25  2023   left foot surgery     LUMBAR LAMINECTOMY/DECOMPRESSION MICRODISCECTOMY N/A 05/16/2023   Procedure: L3-4  DECOMPRESSION;  Surgeon: Andrew Munch, MD;  Location: [facility] ORS;  Service: Neurosurgery;  Laterality: N/A;   TONSILLECTOMY      reports that he has never smoked. He has never been exposed to tobacco smoke. He has never used smokeless tobacco. He reports current alcohol use of about 7.0 standard drinks of alcohol per week. He reports that he does not use drugs. family history includes ALS in his sister; Arthritis in his mother; Colon cancer in his mother, sister, and another family member; Diabetes in his mother; Early death in his brother, father, mother, and sister; Heart disease in his mother; Hyperlipidemia in his mother; Liver cancer in his brother; Lung cancer in his brother. Allergies  Allergen Reactions   Bactrim  [Sulfamethoxazole -Trimethoprim ] Rash   Azithromycin Swelling   Bee Pollen    Blueberry Flavoring Agent (Non-Screening) Rash    Can have, but not an abundance.   Strawberry Extract Rash    Can have, but not an abundance.       Outpatient Encounter Medications as of 09/14/2023  Medication Sig   atorvastatin  (LIPITOR) 20 MG tablet Take 1 tablet (20 mg total) by mouth daily.   celecoxib  (CELEBREX ) 200 MG capsule TAKE 1 CAPSULE (200 MG TOTAL) BY MOUTH DAILY AS NEEDED FOR MODERATE  PAIN (PAIN SCORE 4-6) (PAIN.).   cyanocobalamin  (VITAMIN B12) 1000 MCG/ML injection Inject 1,000 mcg into the muscle every 30 (thirty) days.   Ferrous Sulfate (IRON PO) Take 23 mg by mouth daily in the afternoon.   Magnesium  250 MG TABS Take 250 mg by mouth in the morning.   methocarbamol  (ROBAXIN ) 500 MG tablet Take 1 tablet (500 mg total) by mouth every 8 (eight) hours as needed for muscle spasms.   Multiple Vitamin (MULTIVITAMIN WITH MINERALS) TABS tablet Take 1 tablet by mouth in the morning. MultiVites Gummy   No facility-administered encounter medications on file as of 09/14/2023.    REVIEW OF SYSTEMS  : All other systems reviewed and negative except where noted in the History of Present  Illness.   PHYSICAL EXAM: BP 112/62   Pulse 76   Ht 5\' 9"  (1.753 m)   Wt 170 lb 8 oz (77.3 kg)   SpO2 96%   BMI 25.18 kg/m  General: Well developed white male in no acute distress Head: Normocephalic and atraumatic Eyes:  Sclerae anicteric, conjunctiva pink. Ears: Normal auditory acuity Lungs: Clear throughout to auscultation; no W/R/R. Heart: Regular rate and rhythm; no M/R/G. Rectal:  Will be done at the time of colonoscopy. Musculoskeletal: Symmetrical with no gross deformities  Skin: No lesions on visible extremities Neurological: Alert oriented x 4, grossly non-focal Psychological:  Alert and cooperative. Normal mood and affect  ASSESSMENT AND PLAN: *Family history of colon cancer in his mother:  Mother was diagnosed around age 60.  Patient's last colonoscopy was in 2016 and says that it was normal and also had another normal colonoscopy in around 2006 in [place] .  Will schedule for colonoscopy with Dr. Brice Stuart.  The risks, benefits, and alternatives to colonoscopy were discussed with the patient and he consents to proceed.  *Rectal bleeding: Intermittent, usually after a hard stool.  History of hemorrhoidectomy in his 30s.  Has occasional hard stools with skipping a day or so without a bowel movement.  Will start MiraLAX  1 capful in 8 ounces of liquid daily.  Any further hemorrhoid intervention can be discussed after colonoscopy pending internal versus external hemorrhoids.  CC:  Andrew Knuckles, MD

## 2023-09-15 NOTE — Progress Notes (Signed)
 Attending Physician's Attestation   I have reviewed the chart.   I agree with the Advanced Practitioner's note, impression, and recommendations with any updates as below.    Corliss Parish, MD [facility] Advanced [facility]oscopy Office # [phone]

## 2023-10-02 ENCOUNTER — Encounter: Payer: Self-pay | Admitting: Gastroenterology

## 2023-10-02 ENCOUNTER — Ambulatory Visit (INDEPENDENT_AMBULATORY_CARE_PROVIDER_SITE_OTHER): Admitting: Orthopaedic Surgery

## 2023-10-02 ENCOUNTER — Encounter: Payer: Self-pay | Admitting: Orthopaedic Surgery

## 2023-10-02 DIAGNOSIS — M79641 Pain in right hand: Secondary | ICD-10-CM | POA: Insufficient documentation

## 2023-10-02 MED ORDER — LIDOCAINE HCL 1 % IJ SOLN
1.0000 mL | INTRAMUSCULAR | Status: AC | PRN
Start: 2023-10-02 — End: 2023-10-02
  Administered 2023-10-02: 1 mL

## 2023-10-02 MED ORDER — BUPIVACAINE HCL 0.5 % IJ SOLN
1.0000 mL | INTRAMUSCULAR | Status: AC | PRN
  Administered 2023-10-02: 1 mL via INTRA_ARTICULAR

## 2023-10-02 MED ORDER — METHYLPREDNISOLONE ACETATE 40 MG/ML IJ SUSP
40.0000 mg | INTRAMUSCULAR | Status: AC | PRN
  Administered 2023-10-02: 40 mg via INTRA_ARTICULAR

## 2023-10-02 NOTE — Progress Notes (Signed)
 Office Visit Note   Patient: Andrew Stuart           Date of Birth: [birth year]           MRN: [id number] Visit Date: 10/02/2023              Requested by: [name], MD [address] [place],  [place] [address] PCP: [name], MD   Assessment & Plan: Visit Diagnoses:  1. Pain of right hand     Plan: History of Present Illness Andrew Stuart "Andrew Stuart" is a 73 year old male who presents with right hand pain due to tendonitis.  He experiences pain at the radial styloid of the right hand, which worsens with activities such as making coffee. There is no recent injury. He also has pain in the forearm and weakness in the arm, affecting task performance. No other arm pain is present.  He underwent carpal tunnel release on the left hand in August 2022 and had previous surgery for tendonitis and carpal tunnel in 2015. He had trigger finger treated with cortisone injections over ten years ago.  Physical Exam MUSCULOSKELETAL: [facility] joint grind test negative. Finkelstein's test negative. Pain on wrist flexion. Tenderness over ECRL and ECRB tendons. Tenderness at radial styloid.  Assessment and Plan Right wrist tendonitis Tendonitis of ECRL and ECRB tendons with pain over radial styloid. Non-surgical management preferred. Discussed cortisone injection and steroid pills. He chose cortisone injection. - Administer cortisone injection to right wrist. - Provide wrist brace for immobilization, instruct to wear after lunch.  Follow-Up Instructions: No follow-ups on file.   Orders:  No orders of the defined types were placed in this encounter.  No orders of the defined types were placed in this encounter.     Procedures: Medium Joint Inj: R radiocarpal on 10/02/2023 4:36 PM Indications: pain Details: 25 G needle, dorsal approach Medications: 1 mL lidocaine  1 %; 1 mL bupivacaine  0.5 %; 40 mg methylPREDNISolone  acetate 40 MG/ML Outcome: tolerated well, no immediate  complications Patient was prepped and draped in the usual sterile fashion.       Clinical Data: No additional findings.   Subjective: No chief complaint on file.   HPI  Review of Systems  Constitutional: Negative.   HENT: Negative.    Eyes: Negative.   Respiratory: Negative.    Cardiovascular: Negative.   Gastrointestinal: Negative.   Endocrine: Negative.   Genitourinary: Negative.   Skin: Negative.   Allergic/Immunologic: Negative.   Neurological: Negative.   Hematological: Negative.   Psychiatric/Behavioral: Negative.    All other systems reviewed and are negative.    Objective: Vital Signs: There were no vitals taken for this visit.  Physical Exam Vitals and nursing note reviewed.  Constitutional:      Appearance: He is well-developed.  HENT:     Head: Normocephalic and atraumatic.  Eyes:     Pupils: Pupils are equal, round, and reactive to light.  Pulmonary:     Effort: Pulmonary effort is normal.  Abdominal:     Palpations: Abdomen is soft.  Musculoskeletal:        General: Normal range of motion.     Cervical back: Neck supple.  Skin:    General: Skin is warm.  Neurological:     Mental Status: He is alert and oriented to person, place, and time.  Psychiatric:        Behavior: Behavior normal.        Thought Content: Thought content normal.  Judgment: Judgment normal.     Ortho Exam  Specialty Comments:  No specialty comments available.  Imaging: No results found.   PMFS History: Patient Active Problem List   Diagnosis Date Noted   Pain of right hand 10/02/2023   Rectal bleeding 09/14/2023   Screening for colon cancer 07/26/2023   Lumbar stenosis with neurogenic claudication 05/16/2023   Cervical myelopathy (HCC) 01/24/2023   Cervical spinal stenosis 01/24/2023   Myelomalacia (HCC) 01/24/2023   Spinal stenosis, lumbar region, 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   Facet arthropathy, lumbar 08/16/2022   Right lumbar  radiculitis 07/05/2022   Chronic right-sided low back pain with right-sided sciatica 07/05/2022   Primary hypertension 03/09/2022   Encounter for general adult medical examination with abnormal findings 01/04/2022   Idiopathic progressive neuropathy 10/06/2021   Hyperglycemia 10/06/2021   Varicose veins of both lower extremities with pain 10/06/2021   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   Abnormal electrocardiogram (ECG) (EKG) 03/01/2021   Vitamin B12 deficiency 02/18/2021   Chronic venous insufficiency 02/17/2021   Hyperlipidemia with target LDL less than 130 08/26/2020   BPH without obstruction/lower urinary tract symptoms 08/26/2020   Carpal tunnel syndrome on left 04/28/2020   Family history of colon cancer in mother 03/02/2015   Nephrolithiasis 03/02/2015   Past Medical History:  Diagnosis Date   Abnormal EKG    Allergy    Arthritis    BPH (benign prostatic hyperplasia)    Carpal tunnel syndrome, left    Cauda equina compression (HCC)    Cervical stenosis of spine    severe C3-4   Chronic venous insufficiency    Complication of anesthesia    Elevated blood pressure reading    Hepatitis    History of kidney stones    Hyperglycemia    Hyperlipidemia    Idiopathic progressive neuropathy    Neurogenic claudication due to lumbar spinal stenosis    Right lumbar radiculitis    Spinal stenosis, lumbar region with neurogenic claudication    Syncopal episodes    post- surgery   Varicose veins of both lower extremities    Vitamin B 12 deficiency     Family History  Problem Relation Age of Onset   Arthritis Mother    Diabetes Mother    Early death Mother    Heart disease Mother    Hyperlipidemia Mother    Colon cancer Mother    Early death Father    Early death Sister    Colon cancer Sister    ALS Sister    Early death Brother    Lung cancer Brother    Liver cancer Brother    Colon cancer Other    Esophageal cancer Neg Hx    Pancreatic  cancer Neg Hx    Stomach cancer Neg Hx     Past Surgical History:  Procedure Laterality Date   CARPAL TUNNEL RELEASE Right 2015   CARPAL TUNNEL RELEASE Left 2021   COLONOSCOPY     ENDOVENOUS ABLATION SAPHENOUS VEIN W/ LASER Left 04/12/2022   endovenous laser ablation left greater saphenous vein and stab phlebectomy < 10 incisions left leg by Kirtland Perfect MD   HEMORRHOID SURGERY     HERNIA REPAIR     LAMINOPLASTY     Sept 25  2023   left foot surgery     LUMBAR LAMINECTOMY/DECOMPRESSION MICRODISCECTOMY N/A 05/16/2023   Procedure: L3-4 DECOMPRESSION;  Surgeon: Jodeen Munch, MD;  Location: [facility] ORS;  Service: Neurosurgery;  Laterality: N/A;  TONSILLECTOMY     Social History   Occupational History   Not on file  Tobacco Use   Smoking status: Never    Passive exposure: Never   Smokeless tobacco: Never  Vaping Use   Vaping status: Never Used  Substance and Sexual Activity   Alcohol use: Yes    Alcohol/week: 7.0 standard drinks of alcohol    Types: 7 Glasses of wine per week    Comment: occ wine once a month   Drug use: Never   Sexual activity: Yes    Partners: Male    Birth control/protection: Condom

## 2023-10-07 ENCOUNTER — Encounter: Payer: Self-pay | Admitting: Gastroenterology

## 2023-10-08 ENCOUNTER — Other Ambulatory Visit: Payer: Self-pay

## 2023-10-08 ENCOUNTER — Telehealth: Payer: Self-pay | Admitting: Neurosurgery

## 2023-10-08 ENCOUNTER — Telehealth: Payer: Self-pay | Admitting: *Deleted

## 2023-10-08 DIAGNOSIS — K625 Hemorrhage of anus and rectum: Secondary | ICD-10-CM

## 2023-10-08 MED ORDER — NA SULFATE-K SULFATE-MG SULF 17.5-3.13-1.6 GM/177ML PO SOLN: 1.0000 | Freq: Once | ORAL | 0 refills | Status: AC

## 2023-10-08 NOTE — Telephone Encounter (Signed)
 This pt has been instructed for new hospital case.  Andrew Stuart the pt is requesting a call from you to discuss. He says he has never been told that he has a difficult airway.  Please call as you are able.

## 2023-10-08 NOTE — Telephone Encounter (Addendum)
 I spoke with Mr Andrew Stuart and relayed the information from Renate Caroline, NP. Mr Andrew Stuart will contact his GI provider.

## 2023-10-08 NOTE — Telephone Encounter (Signed)
 JN, Got it.  Patty, Since this patient is documented as a difficult airway, he will need to be scheduled in the hospital-based setting. Please schedule as soon as able. Thanks. GM

## 2023-10-08 NOTE — Telephone Encounter (Signed)
 Patient is calling to find out if there was any difficulty with intubation during his previous surgeries. He states that he was scheduled for a colonoscopy at an outpatient surgery center and they called him to cancel stating he had to have the procedure done at the hospital due to difficultly with intubation.

## 2023-10-08 NOTE — Telephone Encounter (Signed)
 Dr. Brice Campi,  This pt is scheduled with you on June 11.  He is a documented difficult intubation and his procedure will need to be done at the hospital.   Regards,  Cathryn Cobb

## 2023-10-08 NOTE — Telephone Encounter (Signed)
 Patient calling in regards to previous note. Please advise.   Thank you

## 2023-10-08 NOTE — Telephone Encounter (Signed)
 Colon has been set up for 12/13/23 at [facility] with GM at 130 pm

## 2023-10-10 ENCOUNTER — Encounter: Admitting: Gastroenterology

## 2023-10-10 ENCOUNTER — Encounter: Payer: Self-pay | Admitting: *Deleted

## 2023-10-10 NOTE — Telephone Encounter (Signed)
 Dr Brice Campi see the communication below.  Do you want me to move him back to the [facility] or leave him at the hospital? The previous date is not available.

## 2023-10-10 NOTE — Telephone Encounter (Signed)
 Mr Andrew Stuart called again today. He informed our office that he is still being told that his chart states difficult intubation. I reached out to Renate Caroline, NP and [employer], Charity fundraiser. They are working on fixing this and they are going to reach out to the CRNA in [place] and the GI provider to confirm that the issue is resolved. I updated the patient regarding the above information. He was appreciative.

## 2023-10-10 NOTE — Telephone Encounter (Signed)
 Per Renate Caroline, NP, he received a note from CRNA Nulty that Mr Mairena has been cleared for the surgery center. I notified Mr Turpin of this.

## 2023-10-11 ENCOUNTER — Ambulatory Visit

## 2023-10-11 DIAGNOSIS — E538 Deficiency of other specified B group vitamins: Secondary | ICD-10-CM | POA: Diagnosis not present

## 2023-10-11 MED ORDER — CYANOCOBALAMIN 1000 MCG/ML IJ SOLN
1000.0000 ug | Freq: Once | INTRAMUSCULAR | Status: AC
  Administered 2023-10-11: 1000 ug via INTRAMUSCULAR

## 2023-10-11 NOTE — Telephone Encounter (Signed)
 JN, You had a conversation with patient recently, I believe. Could you please clarify final thoughts of ability to perform procedures in the [facility] vs Hospital based for safety perspective? Thanks. GM

## 2023-10-11 NOTE — Telephone Encounter (Signed)
 Dr Brice Campi the pt is upset and would like his case at the [facility] please advise

## 2023-10-11 NOTE — Telephone Encounter (Signed)
 For now keep the current date in the hospital based setting. Thanks. GM

## 2023-10-11 NOTE — Telephone Encounter (Signed)
 PT is calling to confirm that it was cleared on his chart for him to have his colonoscopy. The documentation was incorrect about him having a difficult airway. I advised him of Dr. Marolyn Sis recommendations below and he was very upset. He said its way too costly to have it done in the hospital. Please advise.

## 2023-10-11 NOTE — Telephone Encounter (Signed)
 I did call the pt and let him know as soon as I get a response from GM I will call and let him know the decision.  The pt has been advised of the information and verbalized understanding.

## 2023-10-11 NOTE — Progress Notes (Signed)
Pt was given B12 injection with no complications.

## 2023-10-12 NOTE — Telephone Encounter (Signed)
 Dr. Lincoln Renshaw, Good morning. This patient has previously been taking care of you from an anesthesia standpoint. He is due for a colonoscopy. After review by our anesthesia team, it was noted that he has been documented a difficult airway. Patient has had concerned that he was never told he was difficult airway and is upset by the fact that he cannot have procedures in our ambulatory surgical center. Patient would like clarity as to whether he truly is a difficult airway or not. Certainly the overall goal is to perform procedures as safely as possible, and if you continue to agree that he is a difficult airway, then we will relay this to the patient so he understands why his procedure needs to be done in the hospital-based outpatient setting at [facility] or [facility]. If you could please review his chart and your records and please document what you feel is best for this patient in regards to future anesthesia needs. I greatly appreciate your time and reviewed. GM  Patty, Please let the patient know that we have sent a message to the anesthesiologist for their review and when we hear from them then we can give further update to the patient as to where his procedure will need to be pursued, based on clarification (if any) in regards to his difficult airway. Thanks. GM

## 2023-10-12 NOTE — Telephone Encounter (Signed)
 EMJ, Thank you for the quick response and follow-up. Greatly appreciate that.  JN, Once you have reviewed the updated notation, please make formal final decision about ability to perform this patient's procedure in the [facility] or hospital-based setting. Thanks.  Patty, Lets wait for the final response from John before touching base with the patient. Thanks. GM

## 2023-10-12 NOTE — Addendum Note (Signed)
 Addendum  created 10/12/23 0918 by Baltazar Bonier, MD   Clinical Note Signed, Intraprocedure Blocks edited, SmartForm saved

## 2023-10-12 NOTE — Telephone Encounter (Signed)
 JN, Thank you for your help with this and clarity in view of your review of the case. GM

## 2023-10-12 NOTE — Anesthesia Postprocedure Evaluation (Signed)
 Anesthesia Post Note  Patient: Andrew Stuart  Procedure(s) Performed: L3-4 DECOMPRESSION  Anesthesia Type: General Anesthetic complications: no   No notable events documented.   Last Vitals:  Vitals:   05/16/23 1445 05/16/23 1500  BP: 129/77 124/83  Pulse: 70 73  Resp: 19 16  Temp: (!) 36.3 C 36.4 C  SpO2: 97% 98%    Last Pain:  Vitals:   05/17/23 0823  TempSrc:   PainSc: 0-No pain                 Baltazar Bonier

## 2023-10-16 ENCOUNTER — Telehealth: Payer: Self-pay | Admitting: Gastroenterology

## 2023-10-16 ENCOUNTER — Other Ambulatory Visit: Payer: Self-pay

## 2023-10-16 DIAGNOSIS — K625 Hemorrhage of anus and rectum: Secondary | ICD-10-CM

## 2023-10-16 DIAGNOSIS — Z800 Family history of malignant neoplasm of digestive organs: Secondary | ICD-10-CM

## 2023-10-16 NOTE — Telephone Encounter (Signed)
 The pt has been rescheduled to 12/05/23 in the [facility]. New instructions have been sent to the pt and discussed over the phone. He states he still has prep and does not need another prescription

## 2023-10-16 NOTE — Telephone Encounter (Signed)
See alternate phone note  

## 2023-10-16 NOTE — Telephone Encounter (Signed)
 Patient called and stated that he is wanting to reschedule his procedure that is being done at the hospital. Patient also stated that someone was suppose to get in contact with him on why his procedure is being done at the hospital instead of it being done at our office. Patient is requesting a call back. Please advise.

## 2023-10-16 NOTE — Telephone Encounter (Signed)
 Thanks all for help. GM

## 2023-10-31 ENCOUNTER — Other Ambulatory Visit: Payer: Self-pay | Admitting: Neurosurgery

## 2023-10-31 DIAGNOSIS — Z9889 Other specified postprocedural states: Secondary | ICD-10-CM

## 2023-10-31 DIAGNOSIS — G959 Disease of spinal cord, unspecified: Secondary | ICD-10-CM

## 2023-11-05 ENCOUNTER — Ambulatory Visit (INDEPENDENT_AMBULATORY_CARE_PROVIDER_SITE_OTHER): Admitting: Emergency Medicine

## 2023-11-05 VITALS — BP 120/70 | HR 85 | Temp 98.2°F | Ht 69.0 in | Wt 170.0 lb

## 2023-11-05 DIAGNOSIS — R6889 Other general symptoms and signs: Secondary | ICD-10-CM | POA: Diagnosis not present

## 2023-11-05 DIAGNOSIS — R051 Acute cough: Secondary | ICD-10-CM | POA: Diagnosis not present

## 2023-11-05 DIAGNOSIS — J22 Unspecified acute lower respiratory infection: Secondary | ICD-10-CM | POA: Insufficient documentation

## 2023-11-05 MED ORDER — AMOXICILLIN-POT CLAVULANATE 875-125 MG PO TABS: 1.0000 | ORAL_TABLET | Freq: Two times a day (BID) | ORAL | 0 refills | Status: AC

## 2023-11-05 NOTE — Patient Instructions (Signed)
 Acute Bronchitis, Adult  Acute bronchitis is when air tubes in the lungs (bronchi) suddenly get swollen. The condition can make it hard for you to breathe. In adults, acute bronchitis usually goes away within 2 weeks. A cough caused by bronchitis may last up to 3 weeks. Smoking, allergies, and asthma can make the condition worse. What are the causes? Germs that cause cold and flu (viruses). The most common cause of this condition is the virus that causes the common cold. Bacteria. Substances that bother (irritate) the lungs, including: Smoke from cigarettes and other types of tobacco. Dust and pollen. Fumes from chemicals, gases, or burned fuel. Indoor or outdoor air pollution. What increases the risk? A weak body's defense system. This is also called the immune system. Any condition that affects your lungs and breathing, such as asthma. What are the signs or symptoms? A cough. Coughing up clear, yellow, or green mucus. Making high-pitched whistling sounds when you breathe, most often when you breathe out (wheezing). Runny or stuffy nose. Having too much mucus in your lungs (chest congestion). Shortness of breath. Body aches. A sore throat. How is this treated? Acute bronchitis may go away over time without treatment. Your doctor may tell you to: Drink more fluids. This will help thin your mucus so it is easier to cough up. Use a device that gets medicine into your lungs (inhaler). Use a vaporizer or a humidifier. These are machines that add water to the air. This helps with coughing and poor breathing. Take a medicine that thins mucus and helps clear it from your lungs. Take a medicine that prevents or stops coughing. It is not common to take an antibiotic medicine for this condition. Follow these instructions at home:  Take over-the-counter and prescription medicines only as told by your doctor. Use an inhaler, vaporizer, or humidifier as told by your doctor. Take two teaspoons  (10 mL) of honey at bedtime. This helps lessen your coughing at night. Drink enough fluid to keep your pee (urine) pale yellow. Do not smoke or use any products that contain nicotine or tobacco. If you need help quitting, ask your doctor. Get a lot of rest. Return to your normal activities when your doctor says that it is safe. Keep all follow-up visits. How is this prevented?  Wash your hands often with soap and water for at least 20 seconds. If you cannot use soap and water, use hand sanitizer. Avoid contact with people who have cold symptoms. Try not to touch your mouth, nose, or eyes with your hands. Avoid breathing in smoke or chemical fumes. Make sure to get the flu shot every year. Contact a doctor if: Your symptoms do not get better in 2 weeks. You have trouble coughing up the mucus. Your cough keeps you awake at night. You have a fever. Get help right away if: You cough up blood. You have chest pain. You have very bad shortness of breath. You faint or keep feeling like you are going to faint. You have a very bad headache. Your fever or chills get worse. These symptoms may be an emergency. Get help right away. Call your local emergency services (911 in the [place] Do not wait to see if the symptoms will go away. Do not drive yourself to the hospital. Summary Acute bronchitis is when air tubes in the lungs (bronchi) suddenly get swollen. In adults, acute bronchitis usually goes away within 2 weeks. Drink more fluids. This will help thin your mucus so it is easier  to cough up. Take over-the-counter and prescription medicines only as told by your doctor. Contact a doctor if your symptoms do not improve after 2 weeks of treatment. This information is not intended to replace advice given to you by your health care provider. Make sure you discuss any questions you have with your health care provider. Document Revised: 08/18/2020 Document Reviewed: 08/18/2020 Elsevier Patient  Education  2024 [employer].

## 2023-11-05 NOTE — Assessment & Plan Note (Signed)
 Upper viral respiratory infection now with secondary bacterial infection. Clinically stable.  No red flag signs or symptoms.  No signs of pneumonia. Recommend Augmentin  875 mg twice a day for 7 days Symptom management discussed Advised to rest and stay well-hydrated Advised to contact the office if no better or worse during the next several days

## 2023-11-05 NOTE — Assessment & Plan Note (Signed)
 Symptom management discussed. Advised to rest and stay well-hydrated Advised to contact the office if no better or worse during the next several days

## 2023-11-05 NOTE — Assessment & Plan Note (Signed)
 Cough management discussed Recommend over-the-counter Mucinex DM and cough drops Advised to rest and stay well-hydrated

## 2023-11-05 NOTE — Progress Notes (Signed)
 Andrew Stuart 73 y.o.   Chief Complaint  Patient presents with   Nasal Congestion    Patient states started last week with a scratchy throat, headache, slightly body aches, ear ache on left side that comes and goes some pressure around his head. He coughed up green mucus this morning. He did a at home Covid test Saturday and it was negative. He is only taking zinc to help right now     HISTORY OF PRESENT ILLNESS: Acute problem visit today.  Patient of Dr. Debby Molt. This is a 73 y.o. male complaining of flulike symptoms that started about 4 to 5 days ago Mostly complaining of productive cough Tested negative for COVID at home No other associated symptoms  HPI   Prior to Admission medications   Medication Sig Start Date End Date Taking? Authorizing Provider  atorvastatin  (LIPITOR) 20 MG tablet Take 1 tablet (20 mg total) by mouth daily. 07/10/23  Yes Molt Debby CROME, MD  celecoxib  (CELEBREX ) 200 MG capsule TAKE 1 CAPSULE (200 MG TOTAL) BY MOUTH DAILY AS NEEDED FOR MODERATE PAIN (PAIN SCORE 4-6) (PAIN.). 09/03/23  Yes Gregory Edsel Ruth, PA  cyanocobalamin  (VITAMIN B12) 1000 MCG/ML injection Inject 1,000 mcg into the muscle every 30 (thirty) days.   Yes [provider]  Ferrous Sulfate (IRON PO) Take 23 mg by mouth daily in the afternoon.   Yes [provider]  Magnesium  250 MG TABS Take 250 mg by mouth in the morning.   Yes [provider]  methocarbamol  (ROBAXIN ) 500 MG tablet Take 1 tablet (500 mg total) by mouth every 8 (eight) hours as needed for muscle spasms. 05/16/23  Yes Gregory Edsel Ruth, PA  Multiple Vitamin (MULTIVITAMIN WITH MINERALS) TABS tablet Take 1 tablet by mouth in the morning. MultiVites Gummy   Yes [provider]    Allergies  Allergen Reactions   Bactrim  [Sulfamethoxazole -Trimethoprim ] Rash   Azithromycin Swelling   Bee Pollen    Blueberry Flavoring Agent (Non-Screening) Rash    Can have, but not an abundance.    Strawberry Extract Rash    Can have, but not an abundance.     Patient Active Problem List   Diagnosis Date Noted   Pain of right hand 10/02/2023   Rectal bleeding 09/14/2023   Screening for colon cancer 07/26/2023   Lumbar stenosis with neurogenic claudication 05/16/2023   Cervical myelopathy (HCC) 01/24/2023   Cervical spinal stenosis 01/24/2023   Myelomalacia (HCC) 01/24/2023   Spinal stenosis, lumbar region, 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   Facet arthropathy, lumbar 08/16/2022   Right lumbar radiculitis 07/05/2022   Chronic right-sided low back pain with right-sided sciatica 07/05/2022   Primary hypertension 03/09/2022   Encounter for general adult medical examination with abnormal findings 01/04/2022   Idiopathic progressive neuropathy 10/06/2021   Hyperglycemia 10/06/2021   Varicose veins of both lower extremities with pain 10/06/2021   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   Abnormal electrocardiogram (ECG) (EKG) 03/01/2021   Vitamin B12 deficiency 02/18/2021   Chronic venous insufficiency 02/17/2021   Hyperlipidemia with target LDL less than 130 08/26/2020   BPH without obstruction/lower urinary tract symptoms 08/26/2020   Carpal tunnel syndrome on left 04/28/2020   Family history of colon cancer in mother 03/02/2015   Nephrolithiasis 03/02/2015    Past Medical History:  Diagnosis Date   Abnormal EKG    Allergy    Arthritis    BPH (benign prostatic hyperplasia)    Carpal tunnel syndrome, left  Cauda equina compression (HCC)    Cervical stenosis of spine    severe C3-4   Chronic venous insufficiency    Complication of anesthesia    Elevated blood pressure reading    Hepatitis    History of kidney stones    Hyperglycemia    Hyperlipidemia    Idiopathic progressive neuropathy    Neurogenic claudication due to lumbar spinal stenosis    Right lumbar radiculitis    Spinal stenosis, lumbar region with neurogenic claudication     Syncopal episodes    post- surgery   Varicose veins of both lower extremities    Vitamin B 12 deficiency     Past Surgical History:  Procedure Laterality Date   CARPAL TUNNEL RELEASE Right 2015   CARPAL TUNNEL RELEASE Left 2021   COLONOSCOPY     ENDOVENOUS ABLATION SAPHENOUS VEIN W/ LASER Left 04/12/2022   endovenous laser ablation left greater saphenous vein and stab phlebectomy < 10 incisions left leg by Medford Blade MD   HEMORRHOID SURGERY     HERNIA REPAIR     LAMINOPLASTY     Sept 25  2023   left foot surgery     LUMBAR LAMINECTOMY/DECOMPRESSION MICRODISCECTOMY N/A 05/16/2023   Procedure: L3-4 DECOMPRESSION;  Surgeon: Clois Fret, MD;  Location: [facility];  Service: Neurosurgery;  Laterality: N/A;   TONSILLECTOMY      Social History   Socioeconomic History   Marital status: Married    Spouse name: Not on file   Number of children: Not on file   Years of education: Not on file   Highest education level: 12th grade  Occupational History   Not on file  Tobacco Use   Smoking status: Never    Passive exposure: Never   Smokeless tobacco: Never  Vaping Use   Vaping status: Never Used  Substance and Sexual Activity   Alcohol use: Yes    Alcohol/week: 7.0 standard drinks of alcohol    Types: 7 Glasses of wine per week    Comment: occ wine once a month   Drug use: Never   Sexual activity: Yes    Partners: Male    Birth control/protection: Condom  Other Topics Concern   Not on file  Social History Narrative   Not on file   Social Drivers of Health   Financial Resource Strain: Low Risk  (11/05/2023)   Overall Financial Resource Strain (CARDIA)    Difficulty of Paying Living Expenses: Not hard at all  Food Insecurity: No Food Insecurity (11/05/2023)   Hunger Vital Sign    Worried About Running Out of Food in the Last Year: Never true    Ran Out of Food in the Last Year: Never true  Transportation Needs: No Transportation Needs (11/05/2023)   PRAPARE -  Administrator, Civil Service (Medical): No    Lack of Transportation (Non-Medical): No  Physical Activity: Sufficiently Active (11/05/2023)   Exercise Vital Sign    Days of Exercise per Week: 3 days    Minutes of Exercise per Session: 60 min  Stress: No Stress Concern Present (11/05/2023)   [employer] of Occupational Health - Occupational Stress Questionnaire    Feeling of Stress: Not at all  Social Connections: Moderately Isolated (11/05/2023)   Social Connection and Isolation Panel    Frequency of Communication with Friends and Family: Three times a week    Frequency of Social Gatherings with Friends and Family: Twice a week    Attends Religious Services:  Never    Active Member of Clubs or Organizations: No    Attends Banker Meetings: Not on file    Marital Status: Married  Intimate Partner Violence: Not At Risk (01/28/2023)   Humiliation, Afraid, Rape, and Kick questionnaire    Fear of Current or Ex-Partner: No    Emotionally Abused: No    Physically Abused: No    Sexually Abused: No    Family History  Problem Relation Age of Onset   Arthritis Mother    Diabetes Mother    Early death Mother    Heart disease Mother    Hyperlipidemia Mother    Colon cancer Mother    Early death Father    Early death Sister    Colon cancer Sister    ALS Sister    Early death Brother    Lung cancer Brother    Liver cancer Brother    Colon cancer Other    Esophageal cancer Neg Hx    Pancreatic cancer Neg Hx    Stomach cancer Neg Hx      Review of Systems  Constitutional: Negative.  Negative for chills and fever.  HENT:  Positive for congestion and sore throat.   Respiratory:  Positive for cough and sputum production. Negative for shortness of breath.   Cardiovascular: Negative.  Negative for chest pain and palpitations.  Gastrointestinal:  Negative for abdominal pain, diarrhea, nausea and vomiting.  Genitourinary: Negative.  Negative for dysuria and  hematuria.  Skin: Negative.  Negative for rash.  Neurological: Negative.  Negative for dizziness and headaches.  All other systems reviewed and are negative.   Vitals:   11/05/23 1443  BP: 120/70  Pulse: 85  Temp: 98.2 F (36.8 C)  SpO2: 97%    Physical Exam Vitals reviewed.  Constitutional:      Appearance: Normal appearance.  HENT:     Head: Normocephalic.     Right Ear: Tympanic membrane, ear canal and external ear normal.     Left Ear: Tympanic membrane, ear canal and external ear normal.     Mouth/Throat:     Mouth: Mucous membranes are moist.     Pharynx: Oropharynx is clear.  Eyes:     Extraocular Movements: Extraocular movements intact.     Conjunctiva/sclera: Conjunctivae normal.     Pupils: Pupils are equal, round, and reactive to light.  Cardiovascular:     Rate and Rhythm: Normal rate and regular rhythm.     Pulses: Normal pulses.     Heart sounds: Normal heart sounds.  Pulmonary:     Effort: Pulmonary effort is normal.     Breath sounds: Normal breath sounds.  Musculoskeletal:     Cervical back: No tenderness.  Lymphadenopathy:     Cervical: No cervical adenopathy.  Skin:    General: Skin is warm and dry.     Capillary Refill: Capillary refill takes less than 2 seconds.  Neurological:     General: No focal deficit present.     Mental Status: He is alert and oriented to person, place, and time.  Psychiatric:        Mood and Affect: Mood normal.        Behavior: Behavior normal.      ASSESSMENT & PLAN: A total of 33 minutes was spent with the patient and counseling/coordination of care regarding preparing for this visit, review of most recent office visit notes, review of chronic medical conditions under management, review of all medications, diagnosis of lower  respiratory infection and need for antibiotics, symptom management, prognosis, documentation and need for follow-up if no better or worse during the next several days.  Problem List Items  Addressed This Visit       Respiratory   Lower respiratory infection - Primary   Upper viral respiratory infection now with secondary bacterial infection. Clinically stable.  No red flag signs or symptoms.  No signs of pneumonia. Recommend Augmentin  875 mg twice a day for 7 days Symptom management discussed Advised to rest and stay well-hydrated Advised to contact the office if no better or worse during the next several days      Relevant Medications   amoxicillin -clavulanate (AUGMENTIN ) 875-125 MG tablet     Other   Flu-like symptoms   Symptom management discussed. Advised to rest and stay well-hydrated Advised to contact the office if no better or worse during the next several days.      Acute cough   Cough management discussed Recommend over-the-counter Mucinex DM and cough drops Advised to rest and stay well-hydrated      Patient Instructions  Acute Bronchitis, Adult  Acute bronchitis is when air tubes in the lungs (bronchi) suddenly get swollen. The condition can make it hard for you to breathe. In adults, acute bronchitis usually goes away within 2 weeks. A cough caused by bronchitis may last up to 3 weeks. Smoking, allergies, and asthma can make the condition worse. What are the causes? Germs that cause cold and flu (viruses). The most common cause of this condition is the virus that causes the common cold. Bacteria. Substances that bother (irritate) the lungs, including: Smoke from cigarettes and other types of tobacco. Dust and pollen. Fumes from chemicals, gases, or burned fuel. Indoor or outdoor air pollution. What increases the risk? A weak body's defense system. This is also called the immune system. Any condition that affects your lungs and breathing, such as asthma. What are the signs or symptoms? A cough. Coughing up clear, yellow, or green mucus. Making high-pitched whistling sounds when you breathe, most often when you breathe out (wheezing). Runny  or stuffy nose. Having too much mucus in your lungs (chest congestion). Shortness of breath. Body aches. A sore throat. How is this treated? Acute bronchitis may go away over time without treatment. Your doctor may tell you to: Drink more fluids. This will help thin your mucus so it is easier to cough up. Use a device that gets medicine into your lungs (inhaler). Use a vaporizer or a humidifier. These are machines that add water  to the air. This helps with coughing and poor breathing. Take a medicine that thins mucus and helps clear it from your lungs. Take a medicine that prevents or stops coughing. It is not common to take an antibiotic medicine for this condition. Follow these instructions at home:  Take over-the-counter and prescription medicines only as told by your doctor. Use an inhaler, vaporizer, or humidifier as told by your doctor. Take two teaspoons (10 mL) of honey at bedtime. This helps lessen your coughing at night. Drink enough fluid to keep your pee (urine) pale yellow. Do not smoke or use any products that contain nicotine or tobacco. If you need help quitting, ask your doctor. Get a lot of rest. Return to your normal activities when your doctor says that it is safe. Keep all follow-up visits. How is this prevented?  Wash your hands often with soap and water  for at least 20 seconds. If you cannot use soap and water ,  use hand sanitizer. Avoid contact with people who have cold symptoms. Try not to touch your mouth, nose, or eyes with your hands. Avoid breathing in smoke or chemical fumes. Make sure to get the flu shot every year. Contact a doctor if: Your symptoms do not get better in 2 weeks. You have trouble coughing up the mucus. Your cough keeps you awake at night. You have a fever. Get help right away if: You cough up blood. You have chest pain. You have very bad shortness of breath. You faint or keep feeling like you are going to faint. You have a very  bad headache. Your fever or chills get worse. These symptoms may be an emergency. Get help right away. Call your local emergency services (911 in the [place] Do not wait to see if the symptoms will go away. Do not drive yourself to the hospital. Summary Acute bronchitis is when air tubes in the lungs (bronchi) suddenly get swollen. In adults, acute bronchitis usually goes away within 2 weeks. Drink more fluids. This will help thin your mucus so it is easier to cough up. Take over-the-counter and prescription medicines only as told by your doctor. Contact a doctor if your symptoms do not improve after 2 weeks of treatment. This information is not intended to replace advice given to you by your health care provider. Make sure you discuss any questions you have with your health care provider. Document Revised: 08/18/2020 Document Reviewed: 08/18/2020 Elsevier Patient Education  2024 Elsevier Inc.    Emil Schaumann, MD [facility]

## 2023-11-12 ENCOUNTER — Ambulatory Visit

## 2023-11-14 ENCOUNTER — Ambulatory Visit (INDEPENDENT_AMBULATORY_CARE_PROVIDER_SITE_OTHER)

## 2023-11-14 DIAGNOSIS — E538 Deficiency of other specified B group vitamins: Secondary | ICD-10-CM

## 2023-11-14 MED ORDER — CYANOCOBALAMIN 1000 MCG/ML IJ SOLN
1000.0000 ug | Freq: Once | INTRAMUSCULAR | Status: AC
  Administered 2023-11-14: 1000 ug via INTRAMUSCULAR

## 2023-11-14 NOTE — Progress Notes (Signed)
 After obtaining consent, and per orders of Dr. Yetta Barre, injection of B12 given by Ferdie Ping. Patient instructed to report any adverse reaction to me immediately.

## 2023-11-27 ENCOUNTER — Encounter: Payer: Self-pay | Admitting: Gastroenterology

## 2023-12-04 ENCOUNTER — Telehealth: Payer: Self-pay | Admitting: Gastroenterology

## 2023-12-04 NOTE — Telephone Encounter (Signed)
 Inbound call from patient would like to speak to nurse in regards to prep medication since procedure is scheduled for tomorrow 8/6  Please advise  Thank you

## 2023-12-04 NOTE — Telephone Encounter (Signed)
 Tylene and Laneta this is my error.  This pt was moved from the hospital to the [facility] and I do not set up [facility] cases very often. With the change in instructions to state the arrival time only I admit I forget that at times since I do not scheduled those frequently. Sorry for the confusion on this one. I have been trying to remember the change.

## 2023-12-04 NOTE — Telephone Encounter (Signed)
 Returned pts call. On his instructions it says to start prep at 4:00 am.  He is concerned about being able to finish prior to leaving for his procedure.  Advised him that he should start prep at 3:00 am. Pt verbalized understanding.

## 2023-12-05 ENCOUNTER — Ambulatory Visit: Admitting: Gastroenterology

## 2023-12-05 ENCOUNTER — Encounter: Payer: Self-pay | Admitting: Gastroenterology

## 2023-12-05 VITALS — BP 93/58 | HR 65 | Temp 97.8°F | Resp 14 | Ht 69.0 in | Wt 170.0 lb

## 2023-12-05 DIAGNOSIS — K562 Volvulus: Secondary | ICD-10-CM

## 2023-12-05 DIAGNOSIS — Z1211 Encounter for screening for malignant neoplasm of colon: Secondary | ICD-10-CM | POA: Diagnosis present

## 2023-12-05 DIAGNOSIS — K5730 Diverticulosis of large intestine without perforation or abscess without bleeding: Secondary | ICD-10-CM

## 2023-12-05 DIAGNOSIS — Z800 Family history of malignant neoplasm of digestive organs: Secondary | ICD-10-CM | POA: Diagnosis not present

## 2023-12-05 DIAGNOSIS — D128 Benign neoplasm of rectum: Secondary | ICD-10-CM

## 2023-12-05 DIAGNOSIS — K640 First degree hemorrhoids: Secondary | ICD-10-CM | POA: Diagnosis not present

## 2023-12-05 DIAGNOSIS — K621 Rectal polyp: Secondary | ICD-10-CM | POA: Diagnosis not present

## 2023-12-05 MED ORDER — SODIUM CHLORIDE 0.9 % IV SOLN: 500.0000 mL | Freq: Once | INTRAVENOUS | Status: DC

## 2023-12-05 NOTE — Progress Notes (Signed)
 Called to room to assist during endoscopic procedure.  Patient ID and intended procedure confirmed with present staff. Received instructions for my participation in the procedure from the performing physician.

## 2023-12-05 NOTE — Progress Notes (Signed)
 Report to PACU, RN, vss, BBS= Clear.

## 2023-12-05 NOTE — Progress Notes (Signed)
 Vitals-DT  Pt's states no medical or surgical changes since previsit or office visit.

## 2023-12-05 NOTE — Patient Instructions (Addendum)
 Handouts on diverticulosis and polyps given to patient. Await pathology results. Resume previous diet and continue present medications Recommended to have a high-fiber diet  Use Fibercon 1-2 tablets daily (this is an over the counter medication) Repeat colonoscopy in 5 years for surveillance (no matter pathology due to family history)    YOU HAD AN ENDOSCOPIC PROCEDURE TODAY AT THE [facility]:   Refer to the procedure report that was given to you for any specific questions about what was found during the examination.  If the procedure report does not answer your questions, please call your gastroenterologist to clarify.  If you requested that your care partner not be given the details of your procedure findings, then the procedure report has been included in a sealed envelope for you to review at your convenience later.  YOU SHOULD EXPECT: Some feelings of bloating in the abdomen. Passage of more gas than usual.  Walking can help get rid of the air that was put into your GI tract during the procedure and reduce the bloating. If you had a lower endoscopy (such as a colonoscopy or flexible sigmoidoscopy) you may notice spotting of blood in your stool or on the toilet paper. If you underwent a bowel prep for your procedure, you may not have a normal bowel movement for a few days.  Please Note:  You might notice some irritation and congestion in your nose or some drainage.  This is from the oxygen used during your procedure.  There is no need for concern and it should clear up in a day or so.  SYMPTOMS TO REPORT IMMEDIATELY:  Following lower endoscopy (colonoscopy or flexible sigmoidoscopy):  Excessive amounts of blood in the stool  Significant tenderness or worsening of abdominal pains  Swelling of the abdomen that is new, acute  Fever of 100F or higher  For urgent or emergent issues, a gastroenterologist can be reached at any hour by calling [phone]. Do not use MyChart  messaging for urgent concerns.    DIET:  We do recommend a small meal at first, but then you may proceed to your regular diet.  Drink plenty of fluids but you should avoid alcoholic beverages for 24 hours.  ACTIVITY:  You should plan to take it easy for the rest of today and you should NOT DRIVE or use heavy machinery until tomorrow (because of the sedation medicines used during the test).    FOLLOW UP: Our staff will call the number listed on your records the next business day following your procedure.  We will call around 7:15- 8:00 am to check on you and address any questions or concerns that you may have regarding the information given to you following your procedure. If we do not reach you, we will leave a message.     If any biopsies were taken you will be contacted by phone or by letter within the next 1-3 weeks.  Please call us  at [phone] if you have not heard about the biopsies in 3 weeks.    SIGNATURES/CONFIDENTIALITY: You and/or your care partner have signed paperwork which will be entered into your electronic medical record.  These signatures attest to the fact that that the information above on your After Visit Summary has been reviewed and is understood.  Full responsibility of the confidentiality of this discharge information lies with you and/or your care-partner.

## 2023-12-05 NOTE — Op Note (Signed)
 [facility] Patient Name: Andrew Stuart Procedure Date: 12/05/2023 7:57 AM MRN: [id number] Endoscopist: Aloha Finner , MD, [id number] Age: 73 Referring MD:  Date of Birth: [birth year] Gender: Male Account #: [id number] Procedure:                Colonoscopy Indications:              Colon cancer screening in patient at increased                            risk: Colorectal cancer in mother Medicines:                Monitored Anesthesia Care Procedure:                Pre-Anesthesia Assessment:                           - Prior to the procedure, a History and Physical                            was performed, and patient medications and                            allergies were reviewed. The patient's tolerance of                            previous anesthesia was also reviewed. The risks                            and benefits of the procedure and the sedation                            options and risks were discussed with the patient.                            All questions were answered, and informed consent                            was obtained. Prior Anticoagulants: The patient has                            taken no anticoagulant or antiplatelet agents                            except for NSAID medication. ASA Grade Assessment:                            II - A patient with mild systemic disease. After                            reviewing the risks and benefits, the patient was                            deemed in satisfactory condition to undergo the  procedure.                           After obtaining informed consent, the colonoscope                            was passed under direct vision. Throughout the                            procedure, the patient's blood pressure, pulse, and                            oxygen saturations were monitored continuously. The                            CF [id number] #[id number] was introduced through the  anus                            and advanced to the 3 cm into the ileum. The                            colonoscopy was performed without difficulty. The                            patient tolerated the procedure. The quality of the                            bowel preparation was good. The terminal ileum,                            ileocecal valve, appendiceal orifice, and rectum                            were photographed. Scope In: 8:00:35 AM Scope Out: 8:11:51 AM Scope Withdrawal Time: 0 hours 7 minutes 57 seconds  Total Procedure Duration: 0 hours 11 minutes 16 seconds  Findings:                 The digital rectal exam was normal. Pertinent                            negatives include no palpable rectal lesions.                           The colon (entire examined portion) revealed                            moderately excessive looping.                           The terminal ileum and ileocecal valve appeared                            normal.  A 2 mm polyp was found in the rectum. The polyp was                            sessile. The polyp was removed with a cold snare.                            Resection and retrieval were complete.                           Multiple small-mouthed diverticula were found in                            the recto-sigmoid colon.                           Normal mucosa was found in the entire colon                            otherwise.                           Non-bleeding non-thrombosed internal hemorrhoids                            were found during retroflexion, during perianal                            exam and during digital exam. The hemorrhoids were                            Grade I (internal hemorrhoids that do not prolapse). Complications:            No immediate complications. Estimated Blood Loss:     Estimated blood loss was minimal. Impression:               - There was significant looping of the colon.                            - The examined portion of the ileum was normal.                           - One 2 mm polyp in the rectum, removed with a cold                            snare. Resected and retrieved.                           - Diverticulosis in the recto-sigmoid colon.                           - Normal mucosa in the entire examined colon                            otherwise.                           -  Non-bleeding non-thrombosed internal hemorrhoids. Recommendation:           - The patient will be observed post-procedure,                            until all discharge criteria are met.                           - Discharge patient to home.                           - Patient has a contact number available for                            emergencies. The signs and symptoms of potential                            delayed complications were discussed with the                            patient. Return to normal activities tomorrow.                            Written discharge instructions were provided to the                            patient.                           - High fiber diet.                           - Use FiberCon 1-2 tablets PO daily.                           - Await pathology results.                           - Repeat colonoscopy in 5 years for surveillance                            (no matter pathology due to FHx).                           - The findings and recommendations were discussed                            with the patient.                           - The findings and recommendations were discussed                            with the patient's family. Aloha Finner, MD 12/05/2023 8:18:01 AM

## 2023-12-05 NOTE — Progress Notes (Signed)
 GASTROENTEROLOGY PROCEDURE H&P NOTE   Primary Care Physician: Joshua Debby CROME, MD  HPI: Andrew Stuart is a 73 y.o. male who presents for Colonoscopy for high risk screening.  Past Medical History:  Diagnosis Date   Abnormal EKG    Allergy    Arthritis    BPH (benign prostatic hyperplasia)    Carpal tunnel syndrome, left    Cauda equina compression (HCC)    Cervical stenosis of spine    severe C3-4   Chronic venous insufficiency    Complication of anesthesia    Elevated blood pressure reading    Hepatitis    History of kidney stones    Hyperglycemia    Hyperlipidemia    Idiopathic progressive neuropathy    Neurogenic claudication due to lumbar spinal stenosis    Right lumbar radiculitis    Spinal stenosis, lumbar region with neurogenic claudication    Syncopal episodes    post- surgery   Varicose veins of both lower extremities    Vitamin B 12 deficiency    Past Surgical History:  Procedure Laterality Date   CARPAL TUNNEL RELEASE Right 2015   CARPAL TUNNEL RELEASE Left 2021   COLONOSCOPY     ENDOVENOUS ABLATION SAPHENOUS VEIN W/ LASER Left 04/12/2022   endovenous laser ablation left greater saphenous vein and stab phlebectomy < 10 incisions left leg by Medford Blade MD   HEMORRHOID SURGERY     HERNIA REPAIR     LAMINOPLASTY     Sept 25  2023   left foot surgery     LUMBAR LAMINECTOMY/DECOMPRESSION MICRODISCECTOMY N/A 05/16/2023   Procedure: L3-4 DECOMPRESSION;  Surgeon: Clois Fret, MD;  Location: [facility] ORS;  Service: Neurosurgery;  Laterality: N/A;   TONSILLECTOMY     Current Outpatient Medications  Medication Sig Dispense Refill   atorvastatin  (LIPITOR) 20 MG tablet Take 1 tablet (20 mg total) by mouth daily. 90 tablet 1   celecoxib  (CELEBREX ) 200 MG capsule TAKE 1 CAPSULE (200 MG TOTAL) BY MOUTH DAILY AS NEEDED FOR MODERATE PAIN (PAIN SCORE 4-6) (PAIN.). 30 capsule 1   cyanocobalamin  (VITAMIN B12) 1000 MCG/ML injection Inject 1,000 mcg into the muscle  every 30 (thirty) days.     Ferrous Sulfate (IRON PO) Take 23 mg by mouth daily in the afternoon.     Magnesium  250 MG TABS Take 250 mg by mouth in the morning.     methocarbamol  (ROBAXIN ) 500 MG tablet Take 1 tablet (500 mg total) by mouth every 8 (eight) hours as needed for muscle spasms. 90 tablet 0   Multiple Vitamin (MULTIVITAMIN WITH MINERALS) TABS tablet Take 1 tablet by mouth in the morning. MultiVites Gummy     Current Facility-Administered Medications  Medication Dose Route Frequency Provider Last Rate Last Admin   0.9 %  sodium chloride  infusion  500 mL Intravenous Once Mansouraty, Maison Agrusa Jr., MD        Current Outpatient Medications:    atorvastatin  (LIPITOR) 20 MG tablet, Take 1 tablet (20 mg total) by mouth daily., Disp: 90 tablet, Rfl: 1   celecoxib  (CELEBREX ) 200 MG capsule, TAKE 1 CAPSULE (200 MG TOTAL) BY MOUTH DAILY AS NEEDED FOR MODERATE PAIN (PAIN SCORE 4-6) (PAIN.)., Disp: 30 capsule, Rfl: 1   cyanocobalamin  (VITAMIN B12) 1000 MCG/ML injection, Inject 1,000 mcg into the muscle every 30 (thirty) days., Disp: , Rfl:    Ferrous Sulfate (IRON PO), Take 23 mg by mouth daily in the afternoon., Disp: , Rfl:    Magnesium  250 MG TABS,  Take 250 mg by mouth in the morning., Disp: , Rfl:    methocarbamol  (ROBAXIN ) 500 MG tablet, Take 1 tablet (500 mg total) by mouth every 8 (eight) hours as needed for muscle spasms., Disp: 90 tablet, Rfl: 0   Multiple Vitamin (MULTIVITAMIN WITH MINERALS) TABS tablet, Take 1 tablet by mouth in the morning. MultiVites Gummy, Disp: , Rfl:   Current Facility-Administered Medications:    0.9 %  sodium chloride  infusion, 500 mL, Intravenous, Once, Mansouraty, Aloha Raddle., MD Allergies  Allergen Reactions   Azithromycin Swelling   Bactrim  [Sulfamethoxazole -Trimethoprim ] Rash   Bee Pollen Swelling   Blueberry Flavoring Agent (Non-Screening) Rash    Can have, but not an abundance.   Strawberry Extract Rash    Can have, but not an abundance.     Family History  Problem Relation Age of Onset   Arthritis Mother    Diabetes Mother    Early death Mother    Heart disease Mother    Hyperlipidemia Mother    Colon cancer Mother    Early death Father    Early death Sister    Colon cancer Sister    ALS Sister    Early death Brother    Lung cancer Brother    Liver cancer Brother    Colon cancer Other    Esophageal cancer Neg Hx    Pancreatic cancer Neg Hx    Stomach cancer Neg Hx    Social History   Socioeconomic History   Marital status: Married    Spouse name: Not on file   Number of children: Not on file   Years of education: Not on file   Highest education level: 12th grade  Occupational History   Not on file  Tobacco Use   Smoking status: Never    Passive exposure: Never   Smokeless tobacco: Never  Vaping Use   Vaping status: Never Used  Substance and Sexual Activity   Alcohol use: Yes    Alcohol/week: 7.0 standard drinks of alcohol    Types: 7 Glasses of wine per week    Comment: occ wine once a month   Drug use: Never   Sexual activity: Yes    Partners: Male    Birth control/protection: Condom  Other Topics Concern   Not on file  Social History Narrative   Not on file   Social Drivers of Health   Financial Resource Strain: Low Risk  (11/05/2023)   Overall Financial Resource Strain (CARDIA)    Difficulty of Paying Living Expenses: Not hard at all  Food Insecurity: No Food Insecurity (11/05/2023)   Hunger Vital Sign    Worried About Running Out of Food in the Last Year: Never true    Ran Out of Food in the Last Year: Never true  Transportation Needs: No Transportation Needs (11/05/2023)   PRAPARE - Administrator, Civil Service (Medical): No    Lack of Transportation (Non-Medical): No  Physical Activity: Sufficiently Active (11/05/2023)   Exercise Vital Sign    Days of Exercise per Week: 3 days    Minutes of Exercise per Session: 60 min  Stress: No Stress Concern Present (11/05/2023)    [employer] of Occupational Health - Occupational Stress Questionnaire    Feeling of Stress: Not at all  Social Connections: Moderately Isolated (11/05/2023)   Social Connection and Isolation Panel    Frequency of Communication with Friends and Family: Three times a week    Frequency of Social Gatherings with Friends  and Family: Twice a week    Attends Religious Services: Never    Active Member of Clubs or Organizations: No    Attends Engineer, structural: Not on file    Marital Status: Married  Catering manager Violence: Not At Risk (01/28/2023)   Humiliation, Afraid, Rape, and Kick questionnaire    Fear of Current or Ex-Partner: No    Emotionally Abused: No    Physically Abused: No    Sexually Abused: No    Physical Exam: Today's Vitals   12/05/23 0714  BP: 137/79  Pulse: 72  Temp: 97.8 F (36.6 C)  TempSrc: Temporal  SpO2: 99%  Weight: 170 lb (77.1 kg)  Height: 5' 9 (1.753 m)  PainSc: 0-No pain   Body mass index is 25.1 kg/m. GEN: NAD EYE: Sclerae anicteric ENT: MMM CV: Non-tachycardic GI: Soft, NT/ND NEURO:  Alert & Oriented x 3  Lab Results: No results for input(s): WBC, HGB, HCT, PLT in the last 72 hours. BMET No results for input(s): NA, K, CL, CO2, GLUCOSE, BUN, CREATININE, CALCIUM  in the last 72 hours. LFT No results for input(s): PROT, ALBUMIN, AST, ALT, ALKPHOS, BILITOT, BILIDIR, IBILI in the last 72 hours. PT/INR No results for input(s): LABPROT, INR in the last 72 hours.   Impression / Plan: This is a 73 y.o.male who presents for Colonoscopy for high risk screening.  The risks and benefits of endoscopic evaluation/treatment were discussed with the patient and/or family; these include but are not limited to the risk of perforation, infection, bleeding, missed lesions, lack of diagnosis, severe illness requiring hospitalization, as well as anesthesia and sedation related illnesses.  The patient's  history has been reviewed, patient examined, no change in status, and deemed stable for procedure.  The patient and/or family is agreeable to proceed.    Aloha Finner, MD [facility] Office # [phone]

## 2023-12-06 ENCOUNTER — Telehealth: Payer: Self-pay | Admitting: *Deleted

## 2023-12-06 NOTE — Telephone Encounter (Signed)
  Follow up Call-     12/05/2023    7:19 AM 12/05/2023    7:13 AM  Call back number  Post procedure Call Back phone  # [phone]   Permission to leave phone message  Yes     Patient questions:  Do you have a fever, pain , or abdominal swelling? No. Pain Score  0 *  Have you tolerated food without any problems? Yes.    Have you been able to return to your normal activities? Yes.    Do you have any questions about your discharge instructions: Diet   No. Medications  No. Follow up visit  No.  Do you have questions or concerns about your Care? No.  Actions: * If pain score is 4 or above: No action needed, pain <4.

## 2023-12-07 LAB — SURGICAL PATHOLOGY

## 2023-12-09 ENCOUNTER — Ambulatory Visit: Payer: Self-pay | Admitting: Gastroenterology

## 2023-12-13 ENCOUNTER — Encounter (HOSPITAL_COMMUNITY): Payer: Self-pay

## 2023-12-13 ENCOUNTER — Ambulatory Visit (HOSPITAL_COMMUNITY): Admit: 2023-12-13 | Admitting: Gastroenterology

## 2023-12-13 SURGERY — COLONOSCOPY
Anesthesia: Monitor Anesthesia Care

## 2023-12-17 ENCOUNTER — Ambulatory Visit (INDEPENDENT_AMBULATORY_CARE_PROVIDER_SITE_OTHER)

## 2023-12-17 DIAGNOSIS — E538 Deficiency of other specified B group vitamins: Secondary | ICD-10-CM

## 2023-12-17 MED ORDER — CYANOCOBALAMIN 1000 MCG/ML IJ SOLN
1000.0000 ug | Freq: Once | INTRAMUSCULAR | Status: AC
  Administered 2023-12-17: 1000 ug via INTRAMUSCULAR

## 2023-12-17 NOTE — Progress Notes (Signed)
 Pt here for monthly B12 injection per Dr. Joshua  B12 1000mcg given IM and pt tolerated injection well.  Next B12 injection \\has  already been scheduled.

## 2024-01-02 ENCOUNTER — Other Ambulatory Visit: Payer: Self-pay | Admitting: Internal Medicine

## 2024-01-02 DIAGNOSIS — E785 Hyperlipidemia, unspecified: Secondary | ICD-10-CM

## 2024-01-03 ENCOUNTER — Ambulatory Visit: Payer: Medicare HMO

## 2024-01-03 VITALS — Ht 69.0 in | Wt 170.0 lb

## 2024-01-03 DIAGNOSIS — Z0000 Encounter for general adult medical examination without abnormal findings: Secondary | ICD-10-CM | POA: Diagnosis not present

## 2024-01-03 NOTE — Progress Notes (Signed)
 Subjective:   Andrew Stuart is a 73 y.o. who presents for a Medicare Wellness preventive visit.  As a reminder, Annual Wellness Visits don't include a physical exam, and some assessments may be limited, especially if this visit is performed virtually. We may recommend an in-person follow-up visit with your provider if needed.  Visit Complete: Virtual I connected with  Andrew Stuart on 01/03/24 by a audio enabled telemedicine application and verified that I am speaking with the correct person using two identifiers.  Patient Location: Home  Provider Location: Office/Clinic  I discussed the limitations of evaluation and management by telemedicine. The patient expressed understanding and agreed to proceed.  Vital Signs: Because this visit was a virtual/telehealth visit, some criteria may be missing or patient reported. Any vitals not documented were not able to be obtained and vitals that have been documented are patient reported.  VideoDeclined- This patient declined Librarian, academic. Therefore the visit was completed with audio only.  Persons Participating in Visit: Patient.  AWV Questionnaire: No: Patient Medicare AWV questionnaire was not completed prior to this visit.  Cardiac Risk Factors include: advanced age (>92men, >60 women);hypertension;male gender;dyslipidemia     Objective:    Today's Vitals   01/03/24 1043  Weight: 170 lb (77.1 kg)  Height: 5' 9 (1.753 m)   Body mass index is 25.1 kg/m.     01/03/2024   10:56 AM 05/16/2023   10:06 AM 05/08/2023   11:04 AM 01/27/2023    2:40 PM 01/27/2023   10:00 AM 01/24/2023    4:12 PM 01/24/2023    9:17 AM  Advanced Directives  Does Patient Have a Medical Advance Directive? Yes Yes Yes Yes Yes Yes Yes  Type of Estate agent of [place];Living will Healthcare Power of [place];Living will  Living will Living will Living will Living will;Healthcare Power of Attorney  Does  patient want to make changes to medical advance directive?   No - Patient declined No - Patient declined No - Patient declined No - Patient declined   Copy of Healthcare Power of Attorney in Chart? No - copy requested        Would patient like information on creating a medical advance directive?   No - Patient declined        Current Medications (verified) Outpatient Encounter Medications as of 01/03/2024  Medication Sig   atorvastatin  (LIPITOR) 20 MG tablet Take 1 tablet (20 mg total) by mouth daily.   celecoxib  (CELEBREX ) 200 MG capsule TAKE 1 CAPSULE (200 MG TOTAL) BY MOUTH DAILY AS NEEDED FOR MODERATE PAIN (PAIN SCORE 4-6) (PAIN.).   cyanocobalamin  (VITAMIN B12) 1000 MCG/ML injection Inject 1,000 mcg into the muscle every 30 (thirty) days.   Ferrous Sulfate (IRON PO) Take 23 mg by mouth daily in the afternoon.   Magnesium  250 MG TABS Take 250 mg by mouth in the morning.   methocarbamol  (ROBAXIN ) 500 MG tablet Take 1 tablet (500 mg total) by mouth every 8 (eight) hours as needed for muscle spasms.   Multiple Vitamin (MULTIVITAMIN WITH MINERALS) TABS tablet Take 1 tablet by mouth in the morning. MultiVites Gummy   No facility-administered encounter medications on file as of 01/03/2024.    Allergies (verified) Azithromycin, Bactrim  [sulfamethoxazole -trimethoprim ], Bee pollen, Blueberry flavoring agent (non-screening), and Strawberry extract   History: Past Medical History:  Diagnosis Date   Abnormal EKG    Allergy    Arthritis    BPH (benign prostatic hyperplasia)    Carpal  tunnel syndrome, left    Cauda equina compression (HCC)    Cervical stenosis of spine    severe C3-4   Chronic venous insufficiency    Complication of anesthesia    Elevated blood pressure reading    Hepatitis    History of kidney stones    Hyperglycemia    Hyperlipidemia    Idiopathic progressive neuropathy    Neurogenic claudication due to lumbar spinal stenosis    Right lumbar radiculitis    Spinal  stenosis, lumbar region with neurogenic claudication    Syncopal episodes    post- surgery   Varicose veins of both lower extremities    Vitamin B 12 deficiency    Past Surgical History:  Procedure Laterality Date   CARPAL TUNNEL RELEASE Right 2015   CARPAL TUNNEL RELEASE Left 2021   COLONOSCOPY     ENDOVENOUS ABLATION SAPHENOUS VEIN W/ LASER Left 04/12/2022   endovenous laser ablation left greater saphenous vein and stab phlebectomy < 10 incisions left leg by Medford Blade MD   HEMORRHOID SURGERY     HERNIA REPAIR     LAMINOPLASTY     Sept 25  2023   left foot surgery     LUMBAR LAMINECTOMY/DECOMPRESSION MICRODISCECTOMY N/A 05/16/2023   Procedure: L3-4 DECOMPRESSION;  Surgeon: Clois Fret, MD;  Location: [facility] ORS;  Service: Neurosurgery;  Laterality: N/A;   TONSILLECTOMY     Family History  Problem Relation Age of Onset   Arthritis Mother    Diabetes Mother    Early death Mother    Heart disease Mother    Hyperlipidemia Mother    Colon cancer Mother    Early death Father    Early death Sister    Colon cancer Sister    ALS Sister    Early death Brother    Lung cancer Brother    Liver cancer Brother    Colon cancer Other    Esophageal cancer Neg Hx    Pancreatic cancer Neg Hx    Stomach cancer Neg Hx    Social History   Socioeconomic History   Marital status: Married    Spouse name: Reyes   Number of children: Not on file   Years of education: Not on file   Highest education level: 12th grade  Occupational History   Not on file  Tobacco Use   Smoking status: Never    Passive exposure: Never   Smokeless tobacco: Never  Vaping Use   Vaping status: Never Used  Substance and Sexual Activity   Alcohol use: Yes    Alcohol/week: 7.0 standard drinks of alcohol    Types: 7 Glasses of wine per week    Comment: occ wine once a month   Drug use: Never   Sexual activity: Yes    Partners: Male    Birth control/protection: Condom  Other Topics Concern   Not  on file  Social History Narrative   Lives with spouse/2025   Social Drivers of Health   Financial Resource Strain: Low Risk  (01/03/2024)   Overall Financial Resource Strain (CARDIA)    Difficulty of Paying Living Expenses: Not hard at all  Food Insecurity: No Food Insecurity (01/03/2024)   Hunger Vital Sign    Worried About Running Out of Food in the Last Year: Never true    Ran Out of Food in the Last Year: Never true  Transportation Needs: No Transportation Needs (01/03/2024)   PRAPARE - Administrator, Civil Service (Medical): No  Lack of Transportation (Non-Medical): No  Physical Activity: Sufficiently Active (01/03/2024)   Exercise Vital Sign    Days of Exercise per Week: 3 days    Minutes of Exercise per Session: 60 min  Stress: No Stress Concern Present (01/03/2024)   [employer] of Occupational Health - Occupational Stress Questionnaire    Feeling of Stress: Not at all  Social Connections: Moderately Isolated (01/03/2024)   Social Connection and Isolation Panel    Frequency of Communication with Friends and Family: Three times a week    Frequency of Social Gatherings with Friends and Family: Twice a week    Attends Religious Services: Never    Database administrator or Organizations: No    Attends Engineer, structural: Not on file    Marital Status: Married    Tobacco Counseling Counseling given: Not Answered    Clinical Intake:  Pre-visit preparation completed: Yes  Pain : No/denies pain     BMI - recorded: 25.1 Nutritional Status: BMI 25 -29 Overweight Nutritional Risks: None Diabetes: No  Lab Results  Component Value Date   HGBA1C 5.9 10/06/2021     How often do you need to have someone help you when you read instructions, pamphlets, or other written materials from your doctor or pharmacy?: 1 - Never  Interpreter Needed?: No  Information entered by :: Arris Meyn, RMA   Activities of Daily Living     01/03/2024   10:44 AM  05/08/2023   11:08 AM  In your present state of health, do you have any difficulty performing the following activities:  Hearing? 0   Vision? 0   Difficulty concentrating or making decisions? 0   Walking or climbing stairs? 0   Dressing or bathing? 0   Doing errands, shopping? 0 0  Preparing Food and eating ? N   Using the Toilet? N   In the past six months, have you accidently leaked urine? N   Do you have problems with loss of bowel control? N   Managing your Medications? N   Managing your Finances? N   Housekeeping or managing your Housekeeping? N     Patient Care Team: Joshua Debby CROME, MD as PCP - General (Internal Medicine)  I have updated your Care Teams any recent Medical Services you may have received from other providers in the past year.     Assessment:   This is a routine wellness examination for Andrew Stuart.  Hearing/Vision screen Hearing Screening - Comments:: Denies hearing difficulties   Vision Screening - Comments:: Wears eyeglasses/   Goals Addressed             This Visit's Progress    DIET - INCREASE WATER  INTAKE   On track    Per pt- increased to 4-6 glasses a day/2025       Depression Screen     01/03/2024   10:59 AM 11/05/2023    2:50 PM 07/09/2023    1:01 PM 01/02/2023   11:44 AM 10/23/2022    9:24 AM 08/16/2022    9:54 AM 07/05/2022    1:00 PM  PHQ 2/9 Scores  PHQ - 2 Score 0 0 0 0 0 0 0  PHQ- 9 Score 0   0  1     Fall Risk     01/03/2024   10:56 AM 11/05/2023    2:50 PM 07/09/2023    1:01 PM 01/02/2023   11:47 AM 12/29/2022    9:19 AM  Fall Risk  Falls in the past year? 0 0 0 0 0  Number falls in past yr: 0 0 0 0   Injury with Fall? 0 0 0 0 0  Risk for fall due to :  No Fall Risks No Fall Risks No Fall Risks   Follow up Falls evaluation completed;Falls prevention discussed Falls evaluation completed Falls evaluation completed Falls prevention discussed     MEDICARE RISK AT HOME:  Medicare Risk at Home Any stairs in or around the home?: Yes  (14 steps in home) If so, are there any without handrails?: No Home free of loose throw rugs in walkways, pet beds, electrical cords, etc?: Yes Adequate lighting in your home to reduce risk of falls?: Yes Life alert?: No Use of a cane, walker or w/c?: No Grab bars in the bathroom?: No Shower chair or bench in shower?: No Elevated toilet seat or a handicapped toilet?: No  TIMED UP AND GO:  Was the test performed?  No  Cognitive Function: Declined/Normal: No cognitive concerns noted by patient or family. Patient alert, oriented, able to answer questions appropriately and recall recent events. No signs of memory loss or confusion.        01/02/2023   11:48 AM 01/16/2022    8:49 AM 01/09/2021   10:39 AM  6CIT Screen  What Year? 0 points 0 points 0 points  What month? 0 points 0 points 0 points  What time? 0 points 0 points 0 points  Count back from 20 0 points 0 points 0 points  Months in reverse 0 points 0 points 0 points  Repeat phrase 0 points 0 points 2 points  Total Score 0 points 0 points 2 points    Immunizations Immunization History  Administered Date(s) Administered   Fluad Quad(high Dose 65+) 01/12/2022   INFLUENZA, HIGH DOSE SEASONAL PF 12/21/2022   Influenza,inj,Quad PF,6+ Mos 01/12/2022   Influenza-Unspecified 12/31/2019, 12/21/2022   Moderna Covid-19 Fall Seasonal Vaccine 67yrs & older 01/05/2023   PFIZER(Purple Top)SARS-COV-2 Vaccination 02/27/2020, 08/02/2020   PNEUMOCOCCAL CONJUGATE-20 08/26/2020   Pfizer Covid-19 Vaccine Bivalent Booster 106yrs & up 08/31/2021   Pfizer(Comirnaty)Fall Seasonal Vaccine 12 years and older 02/07/2022   Pneumococcal Polysaccharide-23 05/01/2018   Respiratory Syncytial Virus Vaccine,Recomb Aduvanted(Arexvy) 02/07/2022   Tdap 03/17/2015, 06/24/2021   Zoster Recombinant(Shingrix ) 06/03/2021, 08/08/2021    Screening Tests Health Maintenance  Topic Date Due   INFLUENZA VACCINE  11/30/2023   COVID-19 Vaccine (6 - 2024-25 season)  12/31/2023   Medicare Annual Wellness (AWV)  01/02/2024   Colonoscopy  12/04/2028   DTaP/Tdap/Td (3 - Td or Tdap) 06/25/2031   Pneumococcal Vaccine: 50+ Years  Completed   Hepatitis C Screening  Completed   Zoster Vaccines- Shingrix   Completed   HPV VACCINES  Aged Out   Meningococcal B Vaccine  Aged Out    Health Maintenance  Health Maintenance Due  Topic Date Due   INFLUENZA VACCINE  11/30/2023   COVID-19 Vaccine (6 - 2024-25 season) 12/31/2023   Medicare Annual Wellness (AWV)  01/02/2024   Health Maintenance Items Addressed: See Nurse Notes at the end of this note  Additional Screening:  Vision Screening: Recommended annual ophthalmology exams for early detection of glaucoma and other disorders of the eye. Would you like a referral to an eye doctor? No    Dental Screening: Recommended annual dental exams for proper oral hygiene  Community Resource Referral / Chronic Care Management: CRR required this visit?  No   CCM required this visit?  No  Plan:    I have personally reviewed and noted the following in the patient's chart:   Medical and social history Use of alcohol, tobacco or illicit drugs  Current medications and supplements including opioid prescriptions. Patient is not currently taking opioid prescriptions. Functional ability and status Nutritional status Physical activity Advanced directives List of other physicians Hospitalizations, surgeries, and ER visits in previous 12 months Vitals Screenings to include cognitive, depression, and falls Referrals and appointments  In addition, I have reviewed and discussed with patient certain preventive protocols, quality metrics, and best practice recommendations. A written personalized care plan for preventive services as well as general preventive health recommendations were provided to patient.   Kayleena Eke L Andrew Stuart, CMA   01/03/2024   After Visit Summary: (MyChart) Due to this being a telephonic visit, the  after visit summary with patients personalized plan was offered to patient via MyChart   Notes: Nothing significant to report at this time.

## 2024-01-03 NOTE — Patient Instructions (Signed)
 [name] , Thank you for taking time out of your busy schedule to complete your Annual Wellness Visit with me. I enjoyed our conversation and look forward to speaking with you again next year. I, as well as your care team,  appreciate your ongoing commitment to your health goals. Please review the following plan we discussed and let me know if I can assist you in the future. Your Game plan/ To Do List    Follow up Visits: We will see or speak with you next year for your Next Medicare AWV with our clinical staff Have you seen your provider in the last 6 months (3 months if uncontrolled diabetes)? Yes. Last office visit on 11/05/2023.  Clinician Recommendations:  Aim for 30 minutes of exercise or brisk walking, 6-8 glasses of water , and 5 servings of fruits and vegetables each day.  Keep up the good work.      This is a list of the screenings recommended for you:  Health Maintenance  Topic Date Due   Flu Shot  11/30/2023   COVID-19 Vaccine (6 - 2024-25 season) 12/31/2023   Medicare Annual Wellness Visit  01/02/2024   Colon Cancer Screening  12/04/2028   DTaP/Tdap/Td vaccine (3 - Td or Tdap) 06/25/2031   Pneumococcal Vaccine for age over 44  Completed   Hepatitis C Screening  Completed   Zoster (Shingles) Vaccine  Completed   HPV Vaccine  Aged Out   Meningitis B Vaccine  Aged Out    Advanced directives: (Copy Requested) Please bring a copy of your health care power of attorney and living will to the office to be added to your chart at your convenience. You can mail to [facility] [address] [place], [place] [address] or email to [email or web address] Advance Care Planning is important because it:  [x]  Makes sure you receive the medical care that is consistent with your values, goals, and preferences  [x]  It provides guidance to your family and loved ones and reduces their decisional burden about whether or not they are making the right decisions based on your  wishes.  Follow the link provided in your after visit summary or read over the paperwork we have mailed to you to help you started getting your Advance Directives in place. If you need assistance in completing these, please reach out to us  so that we can help you!  See attachments for Preventive Care and Fall Prevention Tips.

## 2024-01-04 ENCOUNTER — Other Ambulatory Visit: Payer: Self-pay | Admitting: Neurosurgery

## 2024-01-04 DIAGNOSIS — G959 Disease of spinal cord, unspecified: Secondary | ICD-10-CM

## 2024-01-04 DIAGNOSIS — Z9889 Other specified postprocedural states: Secondary | ICD-10-CM

## 2024-01-18 ENCOUNTER — Ambulatory Visit (INDEPENDENT_AMBULATORY_CARE_PROVIDER_SITE_OTHER)

## 2024-01-18 DIAGNOSIS — E538 Deficiency of other specified B group vitamins: Secondary | ICD-10-CM

## 2024-01-18 MED ORDER — CYANOCOBALAMIN 1000 MCG/ML IJ SOLN
1000.0000 ug | Freq: Once | INTRAMUSCULAR | Status: AC
  Administered 2024-01-18: 1000 ug via INTRAMUSCULAR

## 2024-01-18 NOTE — Progress Notes (Signed)
 Pt here for monthly B12 injection per   B12 1000mcg given IM and pt tolerated injection well.  Pt responded well to vaccine.

## 2024-02-18 ENCOUNTER — Ambulatory Visit (INDEPENDENT_AMBULATORY_CARE_PROVIDER_SITE_OTHER)

## 2024-02-18 DIAGNOSIS — E538 Deficiency of other specified B group vitamins: Secondary | ICD-10-CM | POA: Diagnosis not present

## 2024-02-18 MED ORDER — CYANOCOBALAMIN 1000 MCG/ML IJ SOLN
1000.0000 ug | Freq: Once | INTRAMUSCULAR | Status: AC
  Administered 2024-02-18: 1000 ug via INTRAMUSCULAR

## 2024-02-18 NOTE — Progress Notes (Signed)
 Pt was given B12 injection w/o any complications at this time.

## 2024-03-01 ENCOUNTER — Other Ambulatory Visit: Payer: Self-pay | Admitting: Physician Assistant

## 2024-03-01 DIAGNOSIS — Z9889 Other specified postprocedural states: Secondary | ICD-10-CM

## 2024-03-01 DIAGNOSIS — G959 Disease of spinal cord, unspecified: Secondary | ICD-10-CM

## 2024-03-03 ENCOUNTER — Encounter: Payer: Self-pay | Admitting: Radiology

## 2024-03-04 ENCOUNTER — Encounter: Payer: Self-pay | Admitting: Internal Medicine

## 2024-03-04 ENCOUNTER — Ambulatory Visit (INDEPENDENT_AMBULATORY_CARE_PROVIDER_SITE_OTHER): Admitting: Internal Medicine

## 2024-03-04 VITALS — BP 132/74 | HR 72 | Temp 97.6°F | Ht 69.0 in | Wt 174.0 lb

## 2024-03-04 DIAGNOSIS — R739 Hyperglycemia, unspecified: Secondary | ICD-10-CM | POA: Diagnosis not present

## 2024-03-04 DIAGNOSIS — N400 Enlarged prostate without lower urinary tract symptoms: Secondary | ICD-10-CM | POA: Diagnosis not present

## 2024-03-04 DIAGNOSIS — E538 Deficiency of other specified B group vitamins: Secondary | ICD-10-CM

## 2024-03-04 DIAGNOSIS — I10 Essential (primary) hypertension: Secondary | ICD-10-CM

## 2024-03-04 DIAGNOSIS — Z0000 Encounter for general adult medical examination without abnormal findings: Secondary | ICD-10-CM | POA: Diagnosis not present

## 2024-03-04 DIAGNOSIS — G320 Subacute combined degeneration of spinal cord in diseases classified elsewhere: Secondary | ICD-10-CM

## 2024-03-04 DIAGNOSIS — Z0001 Encounter for general adult medical examination with abnormal findings: Secondary | ICD-10-CM

## 2024-03-04 LAB — URINALYSIS, ROUTINE W REFLEX MICROSCOPIC
Bilirubin Urine: NEGATIVE
Hgb urine dipstick: NEGATIVE
Ketones, ur: NEGATIVE
Leukocytes,Ua: NEGATIVE
Nitrite: NEGATIVE
RBC / HPF: NONE SEEN (ref 0–?)
Specific Gravity, Urine: <=1.005 — AB (ref 1.000–1.030)
Total Protein, Urine: NEGATIVE
Urine Glucose: NEGATIVE
Urobilinogen, UA: 0.2 (ref 0.0–1.0)
WBC, UA: NONE SEEN (ref 0–?)
pH: 6 (ref 5.0–8.0)

## 2024-03-04 LAB — CBC WITH DIFFERENTIAL/PLATELET
Basophils Absolute: 0 K/uL (ref 0.0–0.1)
Basophils Relative: 0.5 % (ref 0.0–3.0)
Eosinophils Absolute: 0.3 K/uL (ref 0.0–0.7)
Eosinophils Relative: 4.8 % (ref 0.0–5.0)
HCT: 43.1 % (ref 39.0–52.0)
Hemoglobin: 14.5 g/dL (ref 13.0–17.0)
Lymphocytes Relative: 24.3 % (ref 12.0–46.0)
Lymphs Abs: 1.7 K/uL (ref 0.7–4.0)
MCHC: 33.7 g/dL (ref 30.0–36.0)
MCV: 88.1 fl (ref 78.0–100.0)
Monocytes Absolute: 0.5 K/uL (ref 0.1–1.0)
Monocytes Relative: 7.3 % (ref 3.0–12.0)
Neutro Abs: 4.3 K/uL (ref 1.4–7.7)
Neutrophils Relative %: 63.1 % (ref 43.0–77.0)
Platelets: 245 K/uL (ref 150.0–400.0)
RBC: 4.89 Mil/uL (ref 4.22–5.81)
RDW: 13.4 % (ref 11.5–15.5)
WBC: 6.8 K/uL (ref 4.0–10.5)

## 2024-03-04 LAB — HEMOGLOBIN A1C: Hgb A1c MFr Bld: 5.8 % (ref 4.6–6.5)

## 2024-03-04 LAB — BASIC METABOLIC PANEL WITH GFR
BUN: 17 mg/dL (ref 6–23)
CO2: 26 meq/L (ref 19–32)
Calcium: 9.6 mg/dL (ref 8.4–10.5)
Chloride: 103 meq/L (ref 96–112)
Creatinine, Ser: 0.8 mg/dL (ref 0.40–1.50)
GFR: 88.21 mL/min (ref >60.00)
Glucose, Bld: 96 mg/dL (ref 70–99)
Potassium: 4 meq/L (ref 3.5–5.1)
Sodium: 139 meq/L (ref 135–145)

## 2024-03-04 MED ORDER — CYANOCOBALAMIN 1000 MCG/ML IJ SOLN
1000.0000 ug | Freq: Once | INTRAMUSCULAR | Status: AC
  Administered 2024-03-04: 1000 ug via INTRAMUSCULAR

## 2024-03-04 NOTE — Progress Notes (Unsigned)
 Subjective:  Patient ID: Andrew Stuart, male    DOB: March 10, 1951  Age: 73 y.o. MRN: [id number]  CC: Annual Exam and Hyperlipidemia   HPI [name] presents for a CPX and f/up ----  Discussed the use of AI scribe software for clinical note transcription with the patient, who gave verbal consent to proceed.  History of Present Illness Andrew Stuart is a 73 year old male who presents with fatigue and lower back discomfort.  He experiences fatigue, describing his days as having 'good days and tired days.' He resumed gym activities in April following surgery in January, walking two to three miles every other day and performing light weight exercises two to three times a week. Walking uphill can be tiring, but he does not experience shortness of breath or chest pain during these activities.  He manages lower back discomfort with Celebrex  as needed, taking it for about three days when he feels tightness or stiffness. He also experiences neck tightness, which he attributes to post-surgical effects, but does not have pain. He is cautious with activities to avoid exacerbating symptoms.  He has numbness in the foot and outer part of his leg, persisting for over two years since a lower back injury before surgery. No new numbness, weakness, or tingling elsewhere.  Regarding urinary symptoms, he notes variability in urine flow, with some days requiring more frequent urination. He typically gets up one to two times at night to urinate, occasionally three times. He drinks four to eight glasses of water  daily, excluding decaf coffee or tea. No discomfort or blood in the urine.  He is currently taking Celebrex  as needed and a statin, which causes occasional muscle cramps during workouts but no muscle aches. He has used muscle relaxers sparingly, about ten times since January, and prefers to avoid them if possible.     Outpatient Medications Prior to Visit  Medication Sig Dispense Refill    atorvastatin  (LIPITOR) 20 MG tablet TAKE 1 TABLET BY MOUTH EVERY DAY 90 tablet 1   celecoxib  (CELEBREX ) 200 MG capsule TAKE 1 CAPSULE (200 MG TOTAL) BY MOUTH DAILY AS NEEDED FOR MODERATE PAIN (PAIN SCORE 4-6) (PAIN.). 30 capsule 1   cyanocobalamin  (VITAMIN B12) 1000 MCG/ML injection Inject 1,000 mcg into the muscle every 30 (thirty) days.     Ferrous Sulfate (IRON PO) Take 23 mg by mouth daily in the afternoon.     Magnesium  250 MG TABS Take 250 mg by mouth in the morning.     methocarbamol  (ROBAXIN ) 500 MG tablet Take 1 tablet (500 mg total) by mouth every 8 (eight) hours as needed for muscle spasms. 90 tablet 0   Multiple Vitamin (MULTIVITAMIN WITH MINERALS) TABS tablet Take 1 tablet by mouth in the morning. MultiVites Gummy     No facility-administered medications prior to visit.    ROS Review of Systems  Objective:  BP 132/74   Pulse 72   Temp 97.6 F (36.4 C) (Temporal)   Ht 5' 9 (1.753 m)   Wt 174 lb (78.9 kg)   SpO2 98%   BMI 25.70 kg/m   BP Readings from Last 3 Encounters:  03/04/24 132/74  12/05/23 (!) 93/58  11/05/23 120/70    Wt Readings from Last 3 Encounters:  03/04/24 174 lb (78.9 kg)  01/03/24 170 lb (77.1 kg)  12/05/23 170 lb (77.1 kg)    Physical Exam Vitals reviewed.  Constitutional:      Appearance: Normal appearance.  HENT:  Mouth/Throat:     Mouth: Mucous membranes are moist.  Eyes:     General: No scleral icterus.    Pupils: Pupils are equal, round, and reactive to light.  Cardiovascular:     Rate and Rhythm: Normal rate and regular rhythm.     Heart sounds: No murmur heard.    No friction rub. No gallop.  Pulmonary:     Effort: Pulmonary effort is normal.     Breath sounds: No stridor. No wheezing, rhonchi or rales.  Abdominal:     General: Abdomen is flat.     Palpations: There is no mass.     Tenderness: There is no abdominal tenderness. There is no guarding.     Hernia: No hernia is present. There is no hernia in the left  inguinal area or right inguinal area.  Genitourinary:    Pubic Area: No rash.      Penis: Normal.      Testes: Normal.     Epididymis:     Right: Normal.     Left: Normal.     Prostate: Enlarged. Not tender and no nodules present.     Rectum: Guaiac result negative. No mass, tenderness, anal fissure, external hemorrhoid or internal hemorrhoid. Normal anal tone.  Musculoskeletal:        General: Normal range of motion.     Cervical back: Neck supple.     Right lower leg: No edema.     Left lower leg: No edema.  Lymphadenopathy:     Cervical: No cervical adenopathy.     Lower Body: No right inguinal adenopathy. No left inguinal adenopathy.  Skin:    General: Skin is warm and dry.  Neurological:     General: No focal deficit present.     Mental Status: He is alert.  Psychiatric:        Mood and Affect: Mood normal.        Behavior: Behavior normal.     Lab Results  Component Value Date   WBC 6.2 07/09/2023   HGB 13.4 07/09/2023   HCT 40.3 07/09/2023   PLT 263.0 07/09/2023   GLUCOSE 103 (H) 05/08/2023   CHOL 163 07/09/2023   TRIG 93.0 07/09/2023   HDL 62.30 07/09/2023   LDLCALC 82 07/09/2023   ALT 20 07/09/2023   AST 23 07/09/2023   NA 140 05/08/2023   K 4.2 05/08/2023   CL 104 05/08/2023   CREATININE 0.83 05/08/2023   BUN 17 05/08/2023   CO2 27 05/08/2023   TSH 2.10 07/09/2023   PSA 1.88 01/08/2023   HGBA1C 5.9 10/06/2021    CT CARDIAC SCORING ([facility] LOCATIONS ONLY) Result Date: 08/11/2023 CLINICAL DATA:  High cholesterol * Tracking Code: FCC * EXAM: CT CARDIAC CORONARY ARTERY CALCIUM  SCORE TECHNIQUE: Non-contrast imaging through the heart was performed using prospective ECG gating. Image post processing was performed on an independent workstation, allowing for quantitative analysis of the heart and coronary arteries. Note that this exam targets the heart and the chest was not imaged in its entirety. COMPARISON:  None available. FINDINGS: CORONARY CALCIUM  SCORES: Left  Main: 0 LAD: 3 LCx: 0 RCA/PDA: 0 Total Agatston Score: 3 MESA database percentile: 14 AORTA MEASUREMENTS: Ascending Aorta: 3.0 cm Descending Aorta:2.1 cm OTHER FINDINGS: Heart is normal size. Aorta normal caliber. Scattered calcifications in the descending thoracic aorta. No adenopathy. No confluent airspace opacities or effusions. No acute findings in the upper abdomen. Chest wall soft tissues are unremarkable. No acute bony abnormality. IMPRESSION: Total  Agatston score: 3 [place] database percentile: 14 Scattered aortic atherosclerosis. No acute extra cardiac abnormality. Electronically Signed   By: Franky Crease M.D.   On: 08/11/2023 20:15    Assessment & Plan:  Vitamin B12 deficiency -     CBC with Differential/Platelet; Future -     Folate; Future -     Cyanocobalamin   BPH without obstruction/lower urinary tract symptoms -     Urinalysis, Routine w reflex microscopic; Future -     PSA; Future  Hyperglycemia -     Basic metabolic panel with GFR; Future -     Hemoglobin A1c; Future  Encounter for general adult medical examination with abnormal findings     Follow-up: Return in about 6 months (around 09/01/2024).  Debby Molt, MD

## 2024-03-04 NOTE — Patient Instructions (Signed)
 Health Maintenance, Male  Adopting a healthy lifestyle and getting preventive care are important in promoting health and wellness. Ask your health care provider about:  The right schedule for you to have regular tests and exams.  Things you can do on your own to prevent diseases and keep yourself healthy.  What should I know about diet, weight, and exercise?  Eat a healthy diet    Eat a diet that includes plenty of vegetables, fruits, low-fat dairy products, and lean protein.  Do not eat a lot of foods that are high in solid fats, added sugars, or sodium.  Maintain a healthy weight  Body mass index (BMI) is a measurement that can be used to identify possible weight problems. It estimates body fat based on height and weight. Your health care provider can help determine your BMI and help you achieve or maintain a healthy weight.  Get regular exercise  Get regular exercise. This is one of the most important things you can do for your health. Most adults should:  Exercise for at least 150 minutes each week. The exercise should increase your heart rate and make you sweat (moderate-intensity exercise).  Do strengthening exercises at least twice a week. This is in addition to the moderate-intensity exercise.  Spend less time sitting. Even light physical activity can be beneficial.  Watch cholesterol and blood lipids  Have your blood tested for lipids and cholesterol at 72 years of age, then have this test every 5 years.  You may need to have your cholesterol levels checked more often if:  Your lipid or cholesterol levels are high.  You are older than 72 years of age.  You are at high risk for heart disease.  What should I know about cancer screening?  Many types of cancers can be detected early and may often be prevented. Depending on your health history and family history, you may need to have cancer screening at various ages. This may include screening for:  Colorectal cancer.  Prostate cancer.  Skin cancer.  Lung  cancer.  What should I know about heart disease, diabetes, and high blood pressure?  Blood pressure and heart disease  High blood pressure causes heart disease and increases the risk of stroke. This is more likely to develop in people who have high blood pressure readings or are overweight.  Talk with your health care provider about your target blood pressure readings.  Have your blood pressure checked:  Every 3-5 years if you are 24-52 years of age.  Every year if you are 3 years old or older.  If you are between the ages of 60 and 72 and are a current or former smoker, ask your health care provider if you should have a one-time screening for abdominal aortic aneurysm (AAA).  Diabetes  Have regular diabetes screenings. This checks your fasting blood sugar level. Have the screening done:  Once every three years after age 66 if you are at a normal weight and have a low risk for diabetes.  More often and at a younger age if you are overweight or have a high risk for diabetes.  What should I know about preventing infection?  Hepatitis B  If you have a higher risk for hepatitis B, you should be screened for this virus. Talk with your health care provider to find out if you are at risk for hepatitis B infection.  Hepatitis C  Blood testing is recommended for:  Everyone born from [birth year] through 1964.  Anyone  with known risk factors for hepatitis C.  Sexually transmitted infections (STIs)  You should be screened each year for STIs, including gonorrhea and chlamydia, if:  You are sexually active and are younger than 72 years of age.  You are older than 72 years of age and your health care provider tells you that you are at risk for this type of infection.  Your sexual activity has changed since you were last screened, and you are at increased risk for chlamydia or gonorrhea. Ask your health care provider if you are at risk.  Ask your health care provider about whether you are at high risk for HIV. Your health care provider  may recommend a prescription medicine to help prevent HIV infection. If you choose to take medicine to prevent HIV, you should first get tested for HIV. You should then be tested every 3 months for as long as you are taking the medicine.  Follow these instructions at home:  Alcohol use  Do not drink alcohol if your health care provider tells you not to drink.  If you drink alcohol:  Limit how much you have to 0-2 drinks a day.  Know how much alcohol is in your drink. In the [place] one drink equals one 12 oz bottle of beer (355 mL), one 5 oz glass of wine (148 mL), or one 1 oz glass of hard liquor (44 mL).  Lifestyle  Do not use any products that contain nicotine or tobacco. These products include cigarettes, chewing tobacco, and vaping devices, such as e-cigarettes. If you need help quitting, ask your health care provider.  Do not use street drugs.  Do not share needles.  Ask your health care provider for help if you need support or information about quitting drugs.  General instructions  Schedule regular health, dental, and eye exams.  Stay current with your vaccines.  Tell your health care provider if:  You often feel depressed.  You have ever been abused or do not feel safe at home.  Summary  Adopting a healthy lifestyle and getting preventive care are important in promoting health and wellness.  Follow your health care provider's instructions about healthy diet, exercising, and getting tested or screened for diseases.  Follow your health care provider's instructions on monitoring your cholesterol and blood pressure.  This information is not intended to replace advice given to you by your health care provider. Make sure you discuss any questions you have with your health care provider.  Document Revised: 09/06/2020 Document Reviewed: 09/06/2020  Elsevier Patient Education  2024 [employer].

## 2024-03-05 ENCOUNTER — Ambulatory Visit: Payer: Self-pay | Admitting: Internal Medicine

## 2024-03-05 LAB — FOLATE: Folate: >23.7 ng/mL (ref >5.9)

## 2024-03-05 LAB — PSA: PSA: 2.27 ng/mL (ref 0.10–4.00)

## 2024-04-07 ENCOUNTER — Ambulatory Visit

## 2024-04-07 DIAGNOSIS — E538 Deficiency of other specified B group vitamins: Secondary | ICD-10-CM

## 2024-04-07 MED ORDER — CYANOCOBALAMIN 1000 MCG/ML IJ SOLN
1000.0000 ug | Freq: Once | INTRAMUSCULAR | Status: AC
  Administered 2024-04-07: 1000 ug via INTRAMUSCULAR

## 2024-04-07 NOTE — Progress Notes (Signed)
 Pt was given B12 injec w/o any complications at this time.

## 2024-04-26 ENCOUNTER — Telehealth: Payer: Self-pay | Admitting: Neurosurgery

## 2024-04-26 DIAGNOSIS — Z9889 Other specified postprocedural states: Secondary | ICD-10-CM

## 2024-04-26 DIAGNOSIS — G959 Disease of spinal cord, unspecified: Secondary | ICD-10-CM

## 2024-04-28 NOTE — Telephone Encounter (Signed)
"  Celebrex  sent to pharmacy  "

## 2024-04-28 NOTE — Telephone Encounter (Signed)
I left a message for the patient to call us back.

## 2024-04-28 NOTE — Telephone Encounter (Signed)
 Spoke with the patient and he verbalized understanding on all.  Please send in the last refill.

## 2024-04-28 NOTE — Telephone Encounter (Signed)
 Last seen 08/07/23 and he was to f/u prn.  Can refill celebrex  this time, but he should get further refills from his PCP. Please let him know.

## 2024-05-08 ENCOUNTER — Ambulatory Visit (INDEPENDENT_AMBULATORY_CARE_PROVIDER_SITE_OTHER)

## 2024-05-08 DIAGNOSIS — E538 Deficiency of other specified B group vitamins: Secondary | ICD-10-CM | POA: Diagnosis not present

## 2024-05-08 MED ORDER — CYANOCOBALAMIN 1000 MCG/ML IJ SOLN
1000.0000 ug | Freq: Once | INTRAMUSCULAR | Status: AC
  Administered 2024-05-08: 1000 ug via INTRAMUSCULAR

## 2024-05-08 NOTE — Progress Notes (Signed)
 Pt was given B12 injection a/w any complications.

## 2024-05-24 ENCOUNTER — Other Ambulatory Visit: Payer: Self-pay | Admitting: Orthopedic Surgery

## 2024-05-24 DIAGNOSIS — G959 Disease of spinal cord, unspecified: Secondary | ICD-10-CM

## 2024-05-24 DIAGNOSIS — Z9889 Other specified postprocedural states: Secondary | ICD-10-CM

## 2024-06-09 ENCOUNTER — Ambulatory Visit

## 2025-01-06 ENCOUNTER — Encounter: Admitting: Internal Medicine

## 2025-01-07 ENCOUNTER — Ambulatory Visit

## 2025-01-14 ENCOUNTER — Encounter: Admitting: Internal Medicine
